# Patient Record
Sex: Female | Born: 1940
Health system: Southern US, Community
[De-identification: ages and names within clinical notes are randomized; demographics above are authoritative.]

## PROBLEM LIST (undated history)

## (undated) DIAGNOSIS — M1990 Unspecified osteoarthritis, unspecified site: Secondary | ICD-10-CM

## (undated) DIAGNOSIS — K219 Gastro-esophageal reflux disease without esophagitis: Secondary | ICD-10-CM

## (undated) DIAGNOSIS — M810 Age-related osteoporosis without current pathological fracture: Secondary | ICD-10-CM

## (undated) DIAGNOSIS — J45909 Unspecified asthma, uncomplicated: Secondary | ICD-10-CM

## (undated) DIAGNOSIS — I10 Essential (primary) hypertension: Secondary | ICD-10-CM

## (undated) DIAGNOSIS — F419 Anxiety disorder, unspecified: Secondary | ICD-10-CM

## (undated) DIAGNOSIS — C50919 Malignant neoplasm of unspecified site of unspecified female breast: Secondary | ICD-10-CM

## (undated) DIAGNOSIS — F32A Depression, unspecified: Secondary | ICD-10-CM

## (undated) DIAGNOSIS — J189 Pneumonia, unspecified organism: Secondary | ICD-10-CM

## (undated) DIAGNOSIS — H269 Unspecified cataract: Secondary | ICD-10-CM

## (undated) DIAGNOSIS — K589 Irritable bowel syndrome without diarrhea: Secondary | ICD-10-CM

## (undated) DIAGNOSIS — J449 Chronic obstructive pulmonary disease, unspecified: Secondary | ICD-10-CM

## (undated) DIAGNOSIS — Z87448 Personal history of other diseases of urinary system: Secondary | ICD-10-CM

## (undated) DIAGNOSIS — I679 Cerebrovascular disease, unspecified: Secondary | ICD-10-CM

## (undated) HISTORY — PX: TONSILLECTOMY AND ADENOIDECTOMY: SUR1326

## (undated) HISTORY — PX: CATARACT EXTRACTION: SUR2

## (undated) HISTORY — DX: Age-related osteoporosis without current pathological fracture: M81.0

## (undated) HISTORY — DX: Irritable bowel syndrome, unspecified: K58.9

## (undated) HISTORY — DX: Pneumonia, unspecified organism: J18.9

## (undated) HISTORY — DX: Gastro-esophageal reflux disease without esophagitis: K21.9

## (undated) HISTORY — DX: Anxiety disorder, unspecified: F41.9

## (undated) HISTORY — DX: Unspecified osteoarthritis, unspecified site: M19.90

## (undated) HISTORY — DX: Unspecified asthma, uncomplicated: J45.909

## (undated) HISTORY — DX: Unspecified cataract: H26.9

## (undated) HISTORY — DX: Malignant neoplasm of unspecified site of unspecified female breast: C50.919

## (undated) HISTORY — DX: Personal history of other diseases of urinary system: Z87.448

## (undated) HISTORY — PX: KNEE ARTHROSCOPY: SUR90

## (undated) HISTORY — DX: Essential (primary) hypertension: I10

## (undated) HISTORY — DX: Chronic obstructive pulmonary disease, unspecified: J44.9

## (undated) HISTORY — PX: POLYPECTOMY: SHX149

## (undated) HISTORY — PX: APPENDECTOMY: SHX54

## (undated) HISTORY — PX: BACK SURGERY: SHX140

## (undated) HISTORY — DX: Cerebrovascular disease, unspecified: I67.9

## (undated) HISTORY — PX: TUBAL LIGATION: SHX77

## (undated) HISTORY — DX: Depression, unspecified: F32.A

---

## 1975-01-19 HISTORY — PX: ABDOMINAL HYSTERECTOMY: SHX81

## 1995-11-19 DIAGNOSIS — K219 Gastro-esophageal reflux disease without esophagitis: Secondary | ICD-10-CM | POA: Insufficient documentation

## 1995-11-19 HISTORY — DX: Gastro-esophageal reflux disease without esophagitis: K21.9

## 2011-01-25 DIAGNOSIS — J069 Acute upper respiratory infection, unspecified: Secondary | ICD-10-CM | POA: Diagnosis not present

## 2011-01-25 DIAGNOSIS — Z6830 Body mass index (BMI) 30.0-30.9, adult: Secondary | ICD-10-CM | POA: Diagnosis not present

## 2011-03-23 DIAGNOSIS — Z6830 Body mass index (BMI) 30.0-30.9, adult: Secondary | ICD-10-CM | POA: Diagnosis not present

## 2011-03-23 DIAGNOSIS — J209 Acute bronchitis, unspecified: Secondary | ICD-10-CM | POA: Diagnosis not present

## 2011-04-22 DIAGNOSIS — E785 Hyperlipidemia, unspecified: Secondary | ICD-10-CM | POA: Diagnosis not present

## 2011-04-22 DIAGNOSIS — D539 Nutritional anemia, unspecified: Secondary | ICD-10-CM | POA: Diagnosis not present

## 2011-04-26 DIAGNOSIS — K219 Gastro-esophageal reflux disease without esophagitis: Secondary | ICD-10-CM | POA: Diagnosis not present

## 2011-04-26 DIAGNOSIS — R252 Cramp and spasm: Secondary | ICD-10-CM | POA: Diagnosis not present

## 2011-04-26 DIAGNOSIS — Z6829 Body mass index (BMI) 29.0-29.9, adult: Secondary | ICD-10-CM | POA: Diagnosis not present

## 2011-04-26 DIAGNOSIS — E785 Hyperlipidemia, unspecified: Secondary | ICD-10-CM | POA: Diagnosis not present

## 2011-05-10 DIAGNOSIS — M79609 Pain in unspecified limb: Secondary | ICD-10-CM | POA: Diagnosis not present

## 2011-05-10 DIAGNOSIS — Z981 Arthrodesis status: Secondary | ICD-10-CM | POA: Diagnosis not present

## 2011-05-10 DIAGNOSIS — M4000 Postural kyphosis, site unspecified: Secondary | ICD-10-CM | POA: Diagnosis not present

## 2011-05-10 DIAGNOSIS — T84019A Broken internal joint prosthesis, unspecified site, initial encounter: Secondary | ICD-10-CM | POA: Diagnosis not present

## 2011-06-18 DIAGNOSIS — Z1231 Encounter for screening mammogram for malignant neoplasm of breast: Secondary | ICD-10-CM | POA: Diagnosis not present

## 2011-07-13 DIAGNOSIS — Z6830 Body mass index (BMI) 30.0-30.9, adult: Secondary | ICD-10-CM | POA: Diagnosis not present

## 2011-07-13 DIAGNOSIS — M25559 Pain in unspecified hip: Secondary | ICD-10-CM | POA: Diagnosis not present

## 2011-07-13 DIAGNOSIS — R51 Headache: Secondary | ICD-10-CM | POA: Diagnosis not present

## 2011-07-13 DIAGNOSIS — M5382 Other specified dorsopathies, cervical region: Secondary | ICD-10-CM | POA: Diagnosis not present

## 2011-07-15 DIAGNOSIS — M25559 Pain in unspecified hip: Secondary | ICD-10-CM | POA: Diagnosis not present

## 2011-07-15 DIAGNOSIS — R51 Headache: Secondary | ICD-10-CM | POA: Diagnosis not present

## 2011-07-15 DIAGNOSIS — M5382 Other specified dorsopathies, cervical region: Secondary | ICD-10-CM | POA: Diagnosis not present

## 2011-07-15 DIAGNOSIS — G319 Degenerative disease of nervous system, unspecified: Secondary | ICD-10-CM | POA: Diagnosis not present

## 2011-07-16 DIAGNOSIS — Z981 Arthrodesis status: Secondary | ICD-10-CM | POA: Diagnosis not present

## 2011-07-16 DIAGNOSIS — W050XXA Fall from non-moving wheelchair, initial encounter: Secondary | ICD-10-CM | POA: Diagnosis not present

## 2011-07-16 DIAGNOSIS — Y92009 Unspecified place in unspecified non-institutional (private) residence as the place of occurrence of the external cause: Secondary | ICD-10-CM | POA: Diagnosis not present

## 2011-07-16 DIAGNOSIS — T84019A Broken internal joint prosthesis, unspecified site, initial encounter: Secondary | ICD-10-CM | POA: Diagnosis not present

## 2011-07-29 DIAGNOSIS — M1990 Unspecified osteoarthritis, unspecified site: Secondary | ICD-10-CM | POA: Diagnosis not present

## 2011-07-29 DIAGNOSIS — I10 Essential (primary) hypertension: Secondary | ICD-10-CM | POA: Diagnosis not present

## 2011-07-29 DIAGNOSIS — Z6830 Body mass index (BMI) 30.0-30.9, adult: Secondary | ICD-10-CM | POA: Diagnosis not present

## 2011-07-29 DIAGNOSIS — E785 Hyperlipidemia, unspecified: Secondary | ICD-10-CM | POA: Diagnosis not present

## 2011-08-11 DIAGNOSIS — M545 Low back pain: Secondary | ICD-10-CM | POA: Diagnosis not present

## 2011-08-13 DIAGNOSIS — M545 Low back pain: Secondary | ICD-10-CM | POA: Diagnosis not present

## 2011-08-17 DIAGNOSIS — M545 Low back pain: Secondary | ICD-10-CM | POA: Diagnosis not present

## 2011-08-19 DIAGNOSIS — M545 Low back pain: Secondary | ICD-10-CM | POA: Diagnosis not present

## 2011-08-24 DIAGNOSIS — M545 Low back pain: Secondary | ICD-10-CM | POA: Diagnosis not present

## 2011-08-26 DIAGNOSIS — M545 Low back pain: Secondary | ICD-10-CM | POA: Diagnosis not present

## 2011-08-31 DIAGNOSIS — M545 Low back pain: Secondary | ICD-10-CM | POA: Diagnosis not present

## 2011-09-02 DIAGNOSIS — M545 Low back pain: Secondary | ICD-10-CM | POA: Diagnosis not present

## 2011-09-07 DIAGNOSIS — M545 Low back pain: Secondary | ICD-10-CM | POA: Diagnosis not present

## 2011-09-09 DIAGNOSIS — M545 Low back pain: Secondary | ICD-10-CM | POA: Diagnosis not present

## 2011-09-10 DIAGNOSIS — Z1212 Encounter for screening for malignant neoplasm of rectum: Secondary | ICD-10-CM | POA: Diagnosis not present

## 2011-09-10 DIAGNOSIS — E785 Hyperlipidemia, unspecified: Secondary | ICD-10-CM | POA: Diagnosis not present

## 2011-09-10 DIAGNOSIS — Z6830 Body mass index (BMI) 30.0-30.9, adult: Secondary | ICD-10-CM | POA: Diagnosis not present

## 2011-09-10 DIAGNOSIS — Z0000 Encounter for general adult medical examination without abnormal findings: Secondary | ICD-10-CM | POA: Diagnosis not present

## 2011-09-14 DIAGNOSIS — M545 Low back pain: Secondary | ICD-10-CM | POA: Diagnosis not present

## 2011-09-16 DIAGNOSIS — M545 Low back pain: Secondary | ICD-10-CM | POA: Diagnosis not present

## 2011-09-21 DIAGNOSIS — M545 Low back pain: Secondary | ICD-10-CM | POA: Diagnosis not present

## 2011-09-27 DIAGNOSIS — M545 Low back pain: Secondary | ICD-10-CM | POA: Diagnosis not present

## 2011-09-28 DIAGNOSIS — J449 Chronic obstructive pulmonary disease, unspecified: Secondary | ICD-10-CM | POA: Diagnosis not present

## 2011-10-01 DIAGNOSIS — M899 Disorder of bone, unspecified: Secondary | ICD-10-CM | POA: Diagnosis not present

## 2011-10-01 DIAGNOSIS — Z13820 Encounter for screening for osteoporosis: Secondary | ICD-10-CM | POA: Diagnosis not present

## 2011-10-01 DIAGNOSIS — M545 Low back pain: Secondary | ICD-10-CM | POA: Diagnosis not present

## 2011-10-01 DIAGNOSIS — M949 Disorder of cartilage, unspecified: Secondary | ICD-10-CM | POA: Diagnosis not present

## 2011-10-11 DIAGNOSIS — R1013 Epigastric pain: Secondary | ICD-10-CM | POA: Diagnosis not present

## 2011-10-25 DIAGNOSIS — R1013 Epigastric pain: Secondary | ICD-10-CM | POA: Diagnosis not present

## 2011-11-22 DIAGNOSIS — Z6830 Body mass index (BMI) 30.0-30.9, adult: Secondary | ICD-10-CM | POA: Diagnosis not present

## 2011-11-22 DIAGNOSIS — J180 Bronchopneumonia, unspecified organism: Secondary | ICD-10-CM | POA: Diagnosis not present

## 2011-11-29 DIAGNOSIS — J180 Bronchopneumonia, unspecified organism: Secondary | ICD-10-CM | POA: Diagnosis not present

## 2012-01-10 DIAGNOSIS — Z6829 Body mass index (BMI) 29.0-29.9, adult: Secondary | ICD-10-CM | POA: Diagnosis not present

## 2012-01-10 DIAGNOSIS — I10 Essential (primary) hypertension: Secondary | ICD-10-CM | POA: Diagnosis not present

## 2012-01-10 DIAGNOSIS — K219 Gastro-esophageal reflux disease without esophagitis: Secondary | ICD-10-CM | POA: Diagnosis not present

## 2012-01-10 DIAGNOSIS — R252 Cramp and spasm: Secondary | ICD-10-CM | POA: Diagnosis not present

## 2012-03-24 DIAGNOSIS — M47817 Spondylosis without myelopathy or radiculopathy, lumbosacral region: Secondary | ICD-10-CM | POA: Diagnosis not present

## 2012-03-24 DIAGNOSIS — M4180 Other forms of scoliosis, site unspecified: Secondary | ICD-10-CM | POA: Diagnosis not present

## 2012-03-24 DIAGNOSIS — M5137 Other intervertebral disc degeneration, lumbosacral region: Secondary | ICD-10-CM | POA: Diagnosis not present

## 2012-03-24 DIAGNOSIS — M545 Low back pain: Secondary | ICD-10-CM | POA: Diagnosis not present

## 2012-03-24 DIAGNOSIS — M40299 Other kyphosis, site unspecified: Secondary | ICD-10-CM | POA: Diagnosis not present

## 2012-03-24 DIAGNOSIS — M48061 Spinal stenosis, lumbar region without neurogenic claudication: Secondary | ICD-10-CM | POA: Diagnosis not present

## 2012-03-24 DIAGNOSIS — M8448XA Pathological fracture, other site, initial encounter for fracture: Secondary | ICD-10-CM | POA: Diagnosis not present

## 2012-03-24 DIAGNOSIS — R937 Abnormal findings on diagnostic imaging of other parts of musculoskeletal system: Secondary | ICD-10-CM | POA: Diagnosis not present

## 2012-04-14 DIAGNOSIS — J019 Acute sinusitis, unspecified: Secondary | ICD-10-CM | POA: Diagnosis not present

## 2012-04-14 DIAGNOSIS — J309 Allergic rhinitis, unspecified: Secondary | ICD-10-CM | POA: Diagnosis not present

## 2012-04-14 DIAGNOSIS — Z6830 Body mass index (BMI) 30.0-30.9, adult: Secondary | ICD-10-CM | POA: Diagnosis not present

## 2012-04-14 DIAGNOSIS — R42 Dizziness and giddiness: Secondary | ICD-10-CM | POA: Diagnosis not present

## 2012-05-15 DIAGNOSIS — G4700 Insomnia, unspecified: Secondary | ICD-10-CM | POA: Diagnosis not present

## 2012-05-15 DIAGNOSIS — K219 Gastro-esophageal reflux disease without esophagitis: Secondary | ICD-10-CM | POA: Diagnosis not present

## 2012-05-15 DIAGNOSIS — E785 Hyperlipidemia, unspecified: Secondary | ICD-10-CM | POA: Diagnosis not present

## 2012-05-15 DIAGNOSIS — D539 Nutritional anemia, unspecified: Secondary | ICD-10-CM | POA: Diagnosis not present

## 2012-05-15 DIAGNOSIS — J45909 Unspecified asthma, uncomplicated: Secondary | ICD-10-CM | POA: Diagnosis not present

## 2012-05-15 DIAGNOSIS — Z9181 History of falling: Secondary | ICD-10-CM | POA: Diagnosis not present

## 2012-05-15 DIAGNOSIS — I10 Essential (primary) hypertension: Secondary | ICD-10-CM | POA: Diagnosis not present

## 2012-05-15 DIAGNOSIS — Z1331 Encounter for screening for depression: Secondary | ICD-10-CM | POA: Diagnosis not present

## 2012-06-05 DIAGNOSIS — H93299 Other abnormal auditory perceptions, unspecified ear: Secondary | ICD-10-CM | POA: Diagnosis not present

## 2012-06-05 DIAGNOSIS — R350 Frequency of micturition: Secondary | ICD-10-CM | POA: Diagnosis not present

## 2012-06-05 DIAGNOSIS — Z6829 Body mass index (BMI) 29.0-29.9, adult: Secondary | ICD-10-CM | POA: Diagnosis not present

## 2012-06-05 DIAGNOSIS — H9209 Otalgia, unspecified ear: Secondary | ICD-10-CM | POA: Diagnosis not present

## 2012-06-19 DIAGNOSIS — H903 Sensorineural hearing loss, bilateral: Secondary | ICD-10-CM | POA: Diagnosis not present

## 2012-06-20 DIAGNOSIS — Z1231 Encounter for screening mammogram for malignant neoplasm of breast: Secondary | ICD-10-CM | POA: Diagnosis not present

## 2012-07-04 DIAGNOSIS — J45909 Unspecified asthma, uncomplicated: Secondary | ICD-10-CM | POA: Diagnosis not present

## 2012-07-04 DIAGNOSIS — I739 Peripheral vascular disease, unspecified: Secondary | ICD-10-CM | POA: Diagnosis not present

## 2012-07-04 DIAGNOSIS — R071 Chest pain on breathing: Secondary | ICD-10-CM | POA: Diagnosis not present

## 2012-07-04 DIAGNOSIS — G2581 Restless legs syndrome: Secondary | ICD-10-CM | POA: Diagnosis not present

## 2012-07-04 DIAGNOSIS — R079 Chest pain, unspecified: Secondary | ICD-10-CM | POA: Diagnosis not present

## 2012-07-04 DIAGNOSIS — E785 Hyperlipidemia, unspecified: Secondary | ICD-10-CM | POA: Diagnosis not present

## 2012-07-04 DIAGNOSIS — M5030 Other cervical disc degeneration, unspecified cervical region: Secondary | ICD-10-CM | POA: Diagnosis not present

## 2012-07-04 DIAGNOSIS — R0789 Other chest pain: Secondary | ICD-10-CM | POA: Diagnosis not present

## 2012-07-04 DIAGNOSIS — R9431 Abnormal electrocardiogram [ECG] [EKG]: Secondary | ICD-10-CM | POA: Diagnosis not present

## 2012-07-04 DIAGNOSIS — I10 Essential (primary) hypertension: Secondary | ICD-10-CM | POA: Diagnosis not present

## 2012-07-04 DIAGNOSIS — Z79899 Other long term (current) drug therapy: Secondary | ICD-10-CM | POA: Diagnosis not present

## 2012-07-04 DIAGNOSIS — E876 Hypokalemia: Secondary | ICD-10-CM | POA: Diagnosis not present

## 2012-07-05 DIAGNOSIS — I10 Essential (primary) hypertension: Secondary | ICD-10-CM | POA: Diagnosis not present

## 2012-07-05 DIAGNOSIS — R0789 Other chest pain: Secondary | ICD-10-CM | POA: Diagnosis not present

## 2012-07-05 DIAGNOSIS — E785 Hyperlipidemia, unspecified: Secondary | ICD-10-CM | POA: Diagnosis not present

## 2012-07-05 DIAGNOSIS — E876 Hypokalemia: Secondary | ICD-10-CM | POA: Diagnosis not present

## 2012-07-05 DIAGNOSIS — R071 Chest pain on breathing: Secondary | ICD-10-CM | POA: Diagnosis not present

## 2012-07-12 DIAGNOSIS — R079 Chest pain, unspecified: Secondary | ICD-10-CM | POA: Diagnosis not present

## 2012-07-12 DIAGNOSIS — Z6830 Body mass index (BMI) 30.0-30.9, adult: Secondary | ICD-10-CM | POA: Diagnosis not present

## 2012-07-12 DIAGNOSIS — M5030 Other cervical disc degeneration, unspecified cervical region: Secondary | ICD-10-CM | POA: Diagnosis not present

## 2012-07-12 DIAGNOSIS — M5382 Other specified dorsopathies, cervical region: Secondary | ICD-10-CM | POA: Diagnosis not present

## 2012-07-12 DIAGNOSIS — I10 Essential (primary) hypertension: Secondary | ICD-10-CM | POA: Diagnosis not present

## 2012-07-14 DIAGNOSIS — M5382 Other specified dorsopathies, cervical region: Secondary | ICD-10-CM | POA: Diagnosis not present

## 2012-07-17 DIAGNOSIS — I10 Essential (primary) hypertension: Secondary | ICD-10-CM | POA: Diagnosis not present

## 2012-07-17 DIAGNOSIS — R42 Dizziness and giddiness: Secondary | ICD-10-CM | POA: Diagnosis not present

## 2012-07-17 DIAGNOSIS — R51 Headache: Secondary | ICD-10-CM | POA: Diagnosis not present

## 2012-07-17 DIAGNOSIS — S90569A Insect bite (nonvenomous), unspecified ankle, initial encounter: Secondary | ICD-10-CM | POA: Diagnosis not present

## 2012-07-17 DIAGNOSIS — W57XXXA Bitten or stung by nonvenomous insect and other nonvenomous arthropods, initial encounter: Secondary | ICD-10-CM | POA: Diagnosis not present

## 2012-07-17 DIAGNOSIS — T148 Other injury of unspecified body region: Secondary | ICD-10-CM | POA: Diagnosis not present

## 2012-07-31 DIAGNOSIS — M5382 Other specified dorsopathies, cervical region: Secondary | ICD-10-CM | POA: Diagnosis not present

## 2012-08-01 DIAGNOSIS — H25039 Anterior subcapsular polar age-related cataract, unspecified eye: Secondary | ICD-10-CM | POA: Diagnosis not present

## 2012-08-01 DIAGNOSIS — H43819 Vitreous degeneration, unspecified eye: Secondary | ICD-10-CM | POA: Diagnosis not present

## 2012-08-01 DIAGNOSIS — H5200 Hypermetropia, unspecified eye: Secondary | ICD-10-CM | POA: Diagnosis not present

## 2012-08-01 DIAGNOSIS — H52229 Regular astigmatism, unspecified eye: Secondary | ICD-10-CM | POA: Diagnosis not present

## 2012-08-02 DIAGNOSIS — M5382 Other specified dorsopathies, cervical region: Secondary | ICD-10-CM | POA: Diagnosis not present

## 2012-08-04 DIAGNOSIS — H2589 Other age-related cataract: Secondary | ICD-10-CM | POA: Diagnosis not present

## 2012-08-04 DIAGNOSIS — H353190 Nonexudative age-related macular degeneration, unspecified eye, stage unspecified: Secondary | ICD-10-CM | POA: Diagnosis not present

## 2012-08-04 DIAGNOSIS — M5382 Other specified dorsopathies, cervical region: Secondary | ICD-10-CM | POA: Diagnosis not present

## 2012-08-07 DIAGNOSIS — M5382 Other specified dorsopathies, cervical region: Secondary | ICD-10-CM | POA: Diagnosis not present

## 2012-08-09 DIAGNOSIS — M5382 Other specified dorsopathies, cervical region: Secondary | ICD-10-CM | POA: Diagnosis not present

## 2012-08-11 DIAGNOSIS — M5382 Other specified dorsopathies, cervical region: Secondary | ICD-10-CM | POA: Diagnosis not present

## 2012-08-14 DIAGNOSIS — M5382 Other specified dorsopathies, cervical region: Secondary | ICD-10-CM | POA: Diagnosis not present

## 2012-08-15 DIAGNOSIS — L255 Unspecified contact dermatitis due to plants, except food: Secondary | ICD-10-CM | POA: Diagnosis not present

## 2012-08-15 DIAGNOSIS — Z6829 Body mass index (BMI) 29.0-29.9, adult: Secondary | ICD-10-CM | POA: Diagnosis not present

## 2012-08-17 DIAGNOSIS — M5382 Other specified dorsopathies, cervical region: Secondary | ICD-10-CM | POA: Diagnosis not present

## 2012-08-21 DIAGNOSIS — M5382 Other specified dorsopathies, cervical region: Secondary | ICD-10-CM | POA: Diagnosis not present

## 2012-08-24 DIAGNOSIS — M5382 Other specified dorsopathies, cervical region: Secondary | ICD-10-CM | POA: Diagnosis not present

## 2012-08-25 DIAGNOSIS — E785 Hyperlipidemia, unspecified: Secondary | ICD-10-CM | POA: Diagnosis not present

## 2012-08-25 DIAGNOSIS — R252 Cramp and spasm: Secondary | ICD-10-CM | POA: Diagnosis not present

## 2012-08-25 DIAGNOSIS — D539 Nutritional anemia, unspecified: Secondary | ICD-10-CM | POA: Diagnosis not present

## 2012-08-25 DIAGNOSIS — I10 Essential (primary) hypertension: Secondary | ICD-10-CM | POA: Diagnosis not present

## 2012-08-25 DIAGNOSIS — K219 Gastro-esophageal reflux disease without esophagitis: Secondary | ICD-10-CM | POA: Diagnosis not present

## 2012-08-25 DIAGNOSIS — Z6828 Body mass index (BMI) 28.0-28.9, adult: Secondary | ICD-10-CM | POA: Diagnosis not present

## 2012-08-25 DIAGNOSIS — E538 Deficiency of other specified B group vitamins: Secondary | ICD-10-CM | POA: Diagnosis not present

## 2012-08-28 DIAGNOSIS — M5382 Other specified dorsopathies, cervical region: Secondary | ICD-10-CM | POA: Diagnosis not present

## 2012-08-30 DIAGNOSIS — M5382 Other specified dorsopathies, cervical region: Secondary | ICD-10-CM | POA: Diagnosis not present

## 2012-09-06 DIAGNOSIS — M4000 Postural kyphosis, site unspecified: Secondary | ICD-10-CM | POA: Diagnosis not present

## 2012-09-26 DIAGNOSIS — H269 Unspecified cataract: Secondary | ICD-10-CM | POA: Diagnosis not present

## 2012-09-26 DIAGNOSIS — H2589 Other age-related cataract: Secondary | ICD-10-CM | POA: Diagnosis not present

## 2012-09-26 DIAGNOSIS — H259 Unspecified age-related cataract: Secondary | ICD-10-CM | POA: Diagnosis not present

## 2012-10-03 DIAGNOSIS — K219 Gastro-esophageal reflux disease without esophagitis: Secondary | ICD-10-CM | POA: Diagnosis not present

## 2012-10-03 DIAGNOSIS — J449 Chronic obstructive pulmonary disease, unspecified: Secondary | ICD-10-CM | POA: Diagnosis not present

## 2012-10-16 DIAGNOSIS — K439 Ventral hernia without obstruction or gangrene: Secondary | ICD-10-CM | POA: Diagnosis not present

## 2012-10-16 HISTORY — DX: Ventral hernia without obstruction or gangrene: K43.9

## 2012-10-17 DIAGNOSIS — H04129 Dry eye syndrome of unspecified lacrimal gland: Secondary | ICD-10-CM | POA: Diagnosis not present

## 2012-10-17 DIAGNOSIS — H40019 Open angle with borderline findings, low risk, unspecified eye: Secondary | ICD-10-CM | POA: Diagnosis not present

## 2012-10-23 DIAGNOSIS — H2510 Age-related nuclear cataract, unspecified eye: Secondary | ICD-10-CM | POA: Diagnosis not present

## 2012-10-23 DIAGNOSIS — H2589 Other age-related cataract: Secondary | ICD-10-CM | POA: Diagnosis not present

## 2012-10-31 DIAGNOSIS — Z9889 Other specified postprocedural states: Secondary | ICD-10-CM | POA: Insufficient documentation

## 2012-10-31 DIAGNOSIS — Z01818 Encounter for other preprocedural examination: Secondary | ICD-10-CM | POA: Diagnosis not present

## 2012-10-31 HISTORY — DX: Other specified postprocedural states: Z98.890

## 2012-11-03 DIAGNOSIS — L98499 Non-pressure chronic ulcer of skin of other sites with unspecified severity: Secondary | ICD-10-CM | POA: Diagnosis not present

## 2012-11-03 DIAGNOSIS — K439 Ventral hernia without obstruction or gangrene: Secondary | ICD-10-CM | POA: Diagnosis not present

## 2012-11-21 DIAGNOSIS — N390 Urinary tract infection, site not specified: Secondary | ICD-10-CM | POA: Diagnosis not present

## 2012-11-21 DIAGNOSIS — Z6829 Body mass index (BMI) 29.0-29.9, adult: Secondary | ICD-10-CM | POA: Diagnosis not present

## 2012-11-27 DIAGNOSIS — K439 Ventral hernia without obstruction or gangrene: Secondary | ICD-10-CM | POA: Diagnosis not present

## 2012-12-12 DIAGNOSIS — R350 Frequency of micturition: Secondary | ICD-10-CM | POA: Diagnosis not present

## 2012-12-12 DIAGNOSIS — Z6829 Body mass index (BMI) 29.0-29.9, adult: Secondary | ICD-10-CM | POA: Diagnosis not present

## 2012-12-12 DIAGNOSIS — E785 Hyperlipidemia, unspecified: Secondary | ICD-10-CM | POA: Diagnosis not present

## 2012-12-12 DIAGNOSIS — D539 Nutritional anemia, unspecified: Secondary | ICD-10-CM | POA: Diagnosis not present

## 2012-12-12 DIAGNOSIS — I10 Essential (primary) hypertension: Secondary | ICD-10-CM | POA: Diagnosis not present

## 2012-12-13 DIAGNOSIS — N952 Postmenopausal atrophic vaginitis: Secondary | ICD-10-CM | POA: Diagnosis not present

## 2012-12-13 DIAGNOSIS — N3941 Urge incontinence: Secondary | ICD-10-CM | POA: Diagnosis not present

## 2012-12-22 DIAGNOSIS — Z6829 Body mass index (BMI) 29.0-29.9, adult: Secondary | ICD-10-CM | POA: Diagnosis not present

## 2012-12-22 DIAGNOSIS — J209 Acute bronchitis, unspecified: Secondary | ICD-10-CM | POA: Diagnosis not present

## 2013-02-19 DIAGNOSIS — R159 Full incontinence of feces: Secondary | ICD-10-CM | POA: Diagnosis not present

## 2013-02-19 DIAGNOSIS — M549 Dorsalgia, unspecified: Secondary | ICD-10-CM | POA: Diagnosis not present

## 2013-02-19 DIAGNOSIS — N318 Other neuromuscular dysfunction of bladder: Secondary | ICD-10-CM | POA: Diagnosis not present

## 2013-02-26 DIAGNOSIS — M545 Low back pain, unspecified: Secondary | ICD-10-CM | POA: Diagnosis not present

## 2013-02-26 DIAGNOSIS — Z6829 Body mass index (BMI) 29.0-29.9, adult: Secondary | ICD-10-CM | POA: Diagnosis not present

## 2013-02-26 DIAGNOSIS — J069 Acute upper respiratory infection, unspecified: Secondary | ICD-10-CM | POA: Diagnosis not present

## 2013-02-27 DIAGNOSIS — M545 Low back pain, unspecified: Secondary | ICD-10-CM | POA: Diagnosis not present

## 2013-02-28 DIAGNOSIS — M545 Low back pain, unspecified: Secondary | ICD-10-CM | POA: Diagnosis not present

## 2013-03-01 DIAGNOSIS — M545 Low back pain, unspecified: Secondary | ICD-10-CM | POA: Diagnosis not present

## 2013-03-02 DIAGNOSIS — M545 Low back pain, unspecified: Secondary | ICD-10-CM | POA: Diagnosis not present

## 2013-03-03 DIAGNOSIS — M545 Low back pain, unspecified: Secondary | ICD-10-CM | POA: Diagnosis not present

## 2013-03-03 DIAGNOSIS — M6281 Muscle weakness (generalized): Secondary | ICD-10-CM | POA: Diagnosis not present

## 2013-03-07 DIAGNOSIS — J45901 Unspecified asthma with (acute) exacerbation: Secondary | ICD-10-CM | POA: Diagnosis not present

## 2013-03-07 DIAGNOSIS — J019 Acute sinusitis, unspecified: Secondary | ICD-10-CM | POA: Diagnosis not present

## 2013-03-20 DIAGNOSIS — Z6830 Body mass index (BMI) 30.0-30.9, adult: Secondary | ICD-10-CM | POA: Diagnosis not present

## 2013-03-20 DIAGNOSIS — K6289 Other specified diseases of anus and rectum: Secondary | ICD-10-CM | POA: Diagnosis not present

## 2013-03-28 DIAGNOSIS — R3915 Urgency of urination: Secondary | ICD-10-CM | POA: Diagnosis not present

## 2013-03-30 DIAGNOSIS — I10 Essential (primary) hypertension: Secondary | ICD-10-CM | POA: Diagnosis not present

## 2013-03-30 DIAGNOSIS — D539 Nutritional anemia, unspecified: Secondary | ICD-10-CM | POA: Diagnosis not present

## 2013-03-30 DIAGNOSIS — E785 Hyperlipidemia, unspecified: Secondary | ICD-10-CM | POA: Diagnosis not present

## 2013-03-30 DIAGNOSIS — Z6830 Body mass index (BMI) 30.0-30.9, adult: Secondary | ICD-10-CM | POA: Diagnosis not present

## 2013-03-30 DIAGNOSIS — J45909 Unspecified asthma, uncomplicated: Secondary | ICD-10-CM | POA: Diagnosis not present

## 2013-04-03 DIAGNOSIS — J40 Bronchitis, not specified as acute or chronic: Secondary | ICD-10-CM | POA: Diagnosis not present

## 2013-04-03 DIAGNOSIS — J9819 Other pulmonary collapse: Secondary | ICD-10-CM | POA: Diagnosis not present

## 2013-04-04 DIAGNOSIS — N3941 Urge incontinence: Secondary | ICD-10-CM | POA: Diagnosis not present

## 2013-04-04 DIAGNOSIS — N318 Other neuromuscular dysfunction of bladder: Secondary | ICD-10-CM | POA: Diagnosis not present

## 2013-04-11 DIAGNOSIS — N318 Other neuromuscular dysfunction of bladder: Secondary | ICD-10-CM | POA: Diagnosis not present

## 2013-04-11 DIAGNOSIS — R3915 Urgency of urination: Secondary | ICD-10-CM | POA: Diagnosis not present

## 2013-04-12 DIAGNOSIS — E7800 Pure hypercholesterolemia, unspecified: Secondary | ICD-10-CM | POA: Diagnosis not present

## 2013-04-12 DIAGNOSIS — Z79899 Other long term (current) drug therapy: Secondary | ICD-10-CM | POA: Diagnosis not present

## 2013-04-12 DIAGNOSIS — S81009A Unspecified open wound, unspecified knee, initial encounter: Secondary | ICD-10-CM | POA: Diagnosis not present

## 2013-04-12 DIAGNOSIS — IMO0002 Reserved for concepts with insufficient information to code with codable children: Secondary | ICD-10-CM | POA: Diagnosis not present

## 2013-04-12 DIAGNOSIS — S81809A Unspecified open wound, unspecified lower leg, initial encounter: Secondary | ICD-10-CM | POA: Diagnosis not present

## 2013-04-12 DIAGNOSIS — I10 Essential (primary) hypertension: Secondary | ICD-10-CM | POA: Diagnosis not present

## 2013-04-12 DIAGNOSIS — J45909 Unspecified asthma, uncomplicated: Secondary | ICD-10-CM | POA: Diagnosis not present

## 2013-04-17 DIAGNOSIS — K219 Gastro-esophageal reflux disease without esophagitis: Secondary | ICD-10-CM | POA: Diagnosis not present

## 2013-04-17 DIAGNOSIS — Z6829 Body mass index (BMI) 29.0-29.9, adult: Secondary | ICD-10-CM | POA: Diagnosis not present

## 2013-04-17 DIAGNOSIS — J45909 Unspecified asthma, uncomplicated: Secondary | ICD-10-CM | POA: Diagnosis not present

## 2013-04-18 DIAGNOSIS — N318 Other neuromuscular dysfunction of bladder: Secondary | ICD-10-CM | POA: Diagnosis not present

## 2013-04-18 DIAGNOSIS — R3915 Urgency of urination: Secondary | ICD-10-CM | POA: Diagnosis not present

## 2013-04-23 DIAGNOSIS — S81809A Unspecified open wound, unspecified lower leg, initial encounter: Secondary | ICD-10-CM | POA: Diagnosis not present

## 2013-04-23 DIAGNOSIS — Z6829 Body mass index (BMI) 29.0-29.9, adult: Secondary | ICD-10-CM | POA: Diagnosis not present

## 2013-04-23 DIAGNOSIS — S81009A Unspecified open wound, unspecified knee, initial encounter: Secondary | ICD-10-CM | POA: Diagnosis not present

## 2013-04-24 DIAGNOSIS — N3281 Overactive bladder: Secondary | ICD-10-CM | POA: Insufficient documentation

## 2013-04-24 HISTORY — DX: Overactive bladder: N32.81

## 2013-04-25 DIAGNOSIS — N318 Other neuromuscular dysfunction of bladder: Secondary | ICD-10-CM | POA: Diagnosis not present

## 2013-04-25 DIAGNOSIS — R3915 Urgency of urination: Secondary | ICD-10-CM | POA: Diagnosis not present

## 2013-04-26 DIAGNOSIS — I10 Essential (primary) hypertension: Secondary | ICD-10-CM | POA: Diagnosis not present

## 2013-04-26 DIAGNOSIS — R1012 Left upper quadrant pain: Secondary | ICD-10-CM | POA: Diagnosis not present

## 2013-04-26 DIAGNOSIS — N390 Urinary tract infection, site not specified: Secondary | ICD-10-CM | POA: Diagnosis not present

## 2013-04-26 DIAGNOSIS — K439 Ventral hernia without obstruction or gangrene: Secondary | ICD-10-CM | POA: Diagnosis not present

## 2013-04-26 DIAGNOSIS — Z79899 Other long term (current) drug therapy: Secondary | ICD-10-CM | POA: Diagnosis not present

## 2013-04-26 DIAGNOSIS — R109 Unspecified abdominal pain: Secondary | ICD-10-CM | POA: Diagnosis not present

## 2013-04-26 DIAGNOSIS — K7689 Other specified diseases of liver: Secondary | ICD-10-CM | POA: Diagnosis not present

## 2013-04-26 DIAGNOSIS — E7800 Pure hypercholesterolemia, unspecified: Secondary | ICD-10-CM | POA: Diagnosis not present

## 2013-04-26 DIAGNOSIS — J45909 Unspecified asthma, uncomplicated: Secondary | ICD-10-CM | POA: Diagnosis not present

## 2013-05-02 DIAGNOSIS — N3941 Urge incontinence: Secondary | ICD-10-CM | POA: Diagnosis not present

## 2013-05-02 DIAGNOSIS — N318 Other neuromuscular dysfunction of bladder: Secondary | ICD-10-CM | POA: Diagnosis not present

## 2013-05-03 DIAGNOSIS — L981 Factitial dermatitis: Secondary | ICD-10-CM | POA: Diagnosis not present

## 2013-05-09 DIAGNOSIS — N318 Other neuromuscular dysfunction of bladder: Secondary | ICD-10-CM | POA: Diagnosis not present

## 2013-05-09 DIAGNOSIS — N3941 Urge incontinence: Secondary | ICD-10-CM | POA: Diagnosis not present

## 2013-05-16 DIAGNOSIS — R3915 Urgency of urination: Secondary | ICD-10-CM | POA: Diagnosis not present

## 2013-05-16 DIAGNOSIS — N318 Other neuromuscular dysfunction of bladder: Secondary | ICD-10-CM | POA: Diagnosis not present

## 2013-05-25 DIAGNOSIS — N3941 Urge incontinence: Secondary | ICD-10-CM | POA: Diagnosis not present

## 2013-05-25 DIAGNOSIS — N318 Other neuromuscular dysfunction of bladder: Secondary | ICD-10-CM | POA: Diagnosis not present

## 2013-06-06 DIAGNOSIS — N390 Urinary tract infection, site not specified: Secondary | ICD-10-CM | POA: Diagnosis not present

## 2013-06-06 DIAGNOSIS — R3915 Urgency of urination: Secondary | ICD-10-CM | POA: Diagnosis not present

## 2013-06-13 DIAGNOSIS — N318 Other neuromuscular dysfunction of bladder: Secondary | ICD-10-CM | POA: Diagnosis not present

## 2013-06-13 DIAGNOSIS — R3915 Urgency of urination: Secondary | ICD-10-CM | POA: Diagnosis not present

## 2013-06-18 DIAGNOSIS — Z6830 Body mass index (BMI) 30.0-30.9, adult: Secondary | ICD-10-CM | POA: Diagnosis not present

## 2013-06-18 DIAGNOSIS — M545 Low back pain, unspecified: Secondary | ICD-10-CM | POA: Diagnosis not present

## 2013-06-18 DIAGNOSIS — M79609 Pain in unspecified limb: Secondary | ICD-10-CM | POA: Diagnosis not present

## 2013-06-18 DIAGNOSIS — S4980XA Other specified injuries of shoulder and upper arm, unspecified arm, initial encounter: Secondary | ICD-10-CM | POA: Diagnosis not present

## 2013-06-18 DIAGNOSIS — S59909A Unspecified injury of unspecified elbow, initial encounter: Secondary | ICD-10-CM | POA: Diagnosis not present

## 2013-06-18 DIAGNOSIS — IMO0002 Reserved for concepts with insufficient information to code with codable children: Secondary | ICD-10-CM | POA: Diagnosis not present

## 2013-06-18 DIAGNOSIS — S46909A Unspecified injury of unspecified muscle, fascia and tendon at shoulder and upper arm level, unspecified arm, initial encounter: Secondary | ICD-10-CM | POA: Diagnosis not present

## 2013-06-18 DIAGNOSIS — M25529 Pain in unspecified elbow: Secondary | ICD-10-CM | POA: Diagnosis not present

## 2013-06-27 DIAGNOSIS — N3941 Urge incontinence: Secondary | ICD-10-CM | POA: Diagnosis not present

## 2013-06-27 DIAGNOSIS — N390 Urinary tract infection, site not specified: Secondary | ICD-10-CM | POA: Diagnosis not present

## 2013-06-27 DIAGNOSIS — N318 Other neuromuscular dysfunction of bladder: Secondary | ICD-10-CM | POA: Diagnosis not present

## 2013-07-02 DIAGNOSIS — M545 Low back pain, unspecified: Secondary | ICD-10-CM | POA: Diagnosis not present

## 2013-07-03 DIAGNOSIS — N390 Urinary tract infection, site not specified: Secondary | ICD-10-CM | POA: Diagnosis not present

## 2013-07-03 DIAGNOSIS — N3941 Urge incontinence: Secondary | ICD-10-CM | POA: Diagnosis not present

## 2013-07-05 DIAGNOSIS — M545 Low back pain, unspecified: Secondary | ICD-10-CM | POA: Diagnosis not present

## 2013-07-05 DIAGNOSIS — D539 Nutritional anemia, unspecified: Secondary | ICD-10-CM | POA: Diagnosis not present

## 2013-07-05 DIAGNOSIS — Z6829 Body mass index (BMI) 29.0-29.9, adult: Secondary | ICD-10-CM | POA: Diagnosis not present

## 2013-07-05 DIAGNOSIS — I10 Essential (primary) hypertension: Secondary | ICD-10-CM | POA: Diagnosis not present

## 2013-07-05 DIAGNOSIS — E785 Hyperlipidemia, unspecified: Secondary | ICD-10-CM | POA: Diagnosis not present

## 2013-07-09 DIAGNOSIS — Z1231 Encounter for screening mammogram for malignant neoplasm of breast: Secondary | ICD-10-CM | POA: Diagnosis not present

## 2013-07-09 DIAGNOSIS — M545 Low back pain, unspecified: Secondary | ICD-10-CM | POA: Diagnosis not present

## 2013-07-09 DIAGNOSIS — Z981 Arthrodesis status: Secondary | ICD-10-CM | POA: Diagnosis not present

## 2013-07-11 DIAGNOSIS — M545 Low back pain, unspecified: Secondary | ICD-10-CM | POA: Diagnosis not present

## 2013-07-13 DIAGNOSIS — M545 Low back pain, unspecified: Secondary | ICD-10-CM | POA: Diagnosis not present

## 2013-07-17 DIAGNOSIS — M542 Cervicalgia: Secondary | ICD-10-CM | POA: Diagnosis not present

## 2013-07-17 DIAGNOSIS — M5030 Other cervical disc degeneration, unspecified cervical region: Secondary | ICD-10-CM | POA: Diagnosis not present

## 2013-07-17 DIAGNOSIS — M4804 Spinal stenosis, thoracic region: Secondary | ICD-10-CM | POA: Diagnosis not present

## 2013-07-17 DIAGNOSIS — M549 Dorsalgia, unspecified: Secondary | ICD-10-CM | POA: Diagnosis not present

## 2013-07-17 DIAGNOSIS — M47812 Spondylosis without myelopathy or radiculopathy, cervical region: Secondary | ICD-10-CM | POA: Diagnosis not present

## 2013-07-17 DIAGNOSIS — R52 Pain, unspecified: Secondary | ICD-10-CM | POA: Diagnosis not present

## 2013-07-17 DIAGNOSIS — M4800 Spinal stenosis, site unspecified: Secondary | ICD-10-CM | POA: Insufficient documentation

## 2013-07-17 DIAGNOSIS — S22009A Unspecified fracture of unspecified thoracic vertebra, initial encounter for closed fracture: Secondary | ICD-10-CM | POA: Diagnosis not present

## 2013-07-17 DIAGNOSIS — Z981 Arthrodesis status: Secondary | ICD-10-CM | POA: Diagnosis not present

## 2013-07-17 HISTORY — DX: Spinal stenosis, site unspecified: M48.00

## 2013-07-18 DIAGNOSIS — M5124 Other intervertebral disc displacement, thoracic region: Secondary | ICD-10-CM | POA: Diagnosis not present

## 2013-07-18 DIAGNOSIS — R52 Pain, unspecified: Secondary | ICD-10-CM | POA: Diagnosis not present

## 2013-07-23 DIAGNOSIS — M545 Low back pain, unspecified: Secondary | ICD-10-CM | POA: Diagnosis not present

## 2013-07-24 DIAGNOSIS — T84498A Other mechanical complication of other internal orthopedic devices, implants and grafts, initial encounter: Secondary | ICD-10-CM | POA: Insufficient documentation

## 2013-07-24 DIAGNOSIS — M4804 Spinal stenosis, thoracic region: Secondary | ICD-10-CM | POA: Diagnosis not present

## 2013-07-24 HISTORY — DX: Other mechanical complication of other internal orthopedic devices, implants and grafts, initial encounter: T84.498A

## 2013-07-25 DIAGNOSIS — M545 Low back pain, unspecified: Secondary | ICD-10-CM | POA: Diagnosis not present

## 2013-07-26 DIAGNOSIS — M545 Low back pain, unspecified: Secondary | ICD-10-CM | POA: Diagnosis not present

## 2013-07-30 DIAGNOSIS — M545 Low back pain, unspecified: Secondary | ICD-10-CM | POA: Diagnosis not present

## 2013-08-10 DIAGNOSIS — T6391XA Toxic effect of contact with unspecified venomous animal, accidental (unintentional), initial encounter: Secondary | ICD-10-CM | POA: Diagnosis not present

## 2013-08-10 DIAGNOSIS — IMO0002 Reserved for concepts with insufficient information to code with codable children: Secondary | ICD-10-CM | POA: Diagnosis not present

## 2013-08-10 DIAGNOSIS — Z6829 Body mass index (BMI) 29.0-29.9, adult: Secondary | ICD-10-CM | POA: Diagnosis not present

## 2013-08-17 DIAGNOSIS — I10 Essential (primary) hypertension: Secondary | ICD-10-CM | POA: Diagnosis not present

## 2013-08-20 DIAGNOSIS — T84498A Other mechanical complication of other internal orthopedic devices, implants and grafts, initial encounter: Secondary | ICD-10-CM | POA: Diagnosis not present

## 2013-08-20 DIAGNOSIS — H9190 Unspecified hearing loss, unspecified ear: Secondary | ICD-10-CM | POA: Diagnosis present

## 2013-08-20 DIAGNOSIS — Z981 Arthrodesis status: Secondary | ICD-10-CM | POA: Diagnosis not present

## 2013-08-20 DIAGNOSIS — M4804 Spinal stenosis, thoracic region: Secondary | ICD-10-CM | POA: Diagnosis not present

## 2013-08-20 DIAGNOSIS — Z8673 Personal history of transient ischemic attack (TIA), and cerebral infarction without residual deficits: Secondary | ICD-10-CM | POA: Diagnosis not present

## 2013-08-20 DIAGNOSIS — F411 Generalized anxiety disorder: Secondary | ICD-10-CM | POA: Diagnosis present

## 2013-08-20 DIAGNOSIS — R32 Unspecified urinary incontinence: Secondary | ICD-10-CM | POA: Diagnosis present

## 2013-08-20 DIAGNOSIS — Y831 Surgical operation with implant of artificial internal device as the cause of abnormal reaction of the patient, or of later complication, without mention of misadventure at the time of the procedure: Secondary | ICD-10-CM | POA: Diagnosis not present

## 2013-08-20 DIAGNOSIS — M129 Arthropathy, unspecified: Secondary | ICD-10-CM | POA: Diagnosis present

## 2013-08-20 DIAGNOSIS — R918 Other nonspecific abnormal finding of lung field: Secondary | ICD-10-CM | POA: Diagnosis not present

## 2013-08-20 DIAGNOSIS — IMO0002 Reserved for concepts with insufficient information to code with codable children: Secondary | ICD-10-CM | POA: Diagnosis not present

## 2013-08-20 DIAGNOSIS — E785 Hyperlipidemia, unspecified: Secondary | ICD-10-CM | POA: Diagnosis present

## 2013-08-20 DIAGNOSIS — J4489 Other specified chronic obstructive pulmonary disease: Secondary | ICD-10-CM | POA: Diagnosis not present

## 2013-08-20 DIAGNOSIS — M47812 Spondylosis without myelopathy or radiculopathy, cervical region: Secondary | ICD-10-CM | POA: Diagnosis not present

## 2013-08-20 DIAGNOSIS — K219 Gastro-esophageal reflux disease without esophagitis: Secondary | ICD-10-CM | POA: Diagnosis present

## 2013-08-20 DIAGNOSIS — R42 Dizziness and giddiness: Secondary | ICD-10-CM | POA: Diagnosis present

## 2013-08-20 DIAGNOSIS — Q762 Congenital spondylolisthesis: Secondary | ICD-10-CM | POA: Diagnosis not present

## 2013-08-20 DIAGNOSIS — M549 Dorsalgia, unspecified: Secondary | ICD-10-CM | POA: Diagnosis not present

## 2013-08-20 DIAGNOSIS — J449 Chronic obstructive pulmonary disease, unspecified: Secondary | ICD-10-CM | POA: Diagnosis not present

## 2013-08-20 DIAGNOSIS — I2510 Atherosclerotic heart disease of native coronary artery without angina pectoris: Secondary | ICD-10-CM | POA: Diagnosis present

## 2013-08-29 DIAGNOSIS — M8448XD Pathological fracture, other site, subsequent encounter for fracture with routine healing: Secondary | ICD-10-CM | POA: Diagnosis not present

## 2013-08-29 DIAGNOSIS — M129 Arthropathy, unspecified: Secondary | ICD-10-CM | POA: Diagnosis not present

## 2013-08-29 DIAGNOSIS — F411 Generalized anxiety disorder: Secondary | ICD-10-CM | POA: Diagnosis not present

## 2013-08-29 DIAGNOSIS — R262 Difficulty in walking, not elsewhere classified: Secondary | ICD-10-CM | POA: Diagnosis not present

## 2013-08-29 DIAGNOSIS — I10 Essential (primary) hypertension: Secondary | ICD-10-CM | POA: Diagnosis not present

## 2013-08-29 DIAGNOSIS — M47812 Spondylosis without myelopathy or radiculopathy, cervical region: Secondary | ICD-10-CM | POA: Diagnosis not present

## 2013-09-10 DIAGNOSIS — R109 Unspecified abdominal pain: Secondary | ICD-10-CM | POA: Diagnosis not present

## 2013-09-10 DIAGNOSIS — Z6830 Body mass index (BMI) 30.0-30.9, adult: Secondary | ICD-10-CM | POA: Diagnosis not present

## 2013-09-14 DIAGNOSIS — R109 Unspecified abdominal pain: Secondary | ICD-10-CM | POA: Diagnosis not present

## 2013-09-14 DIAGNOSIS — R1011 Right upper quadrant pain: Secondary | ICD-10-CM | POA: Diagnosis not present

## 2013-09-18 DIAGNOSIS — R1084 Generalized abdominal pain: Secondary | ICD-10-CM | POA: Diagnosis not present

## 2013-09-18 DIAGNOSIS — D649 Anemia, unspecified: Secondary | ICD-10-CM | POA: Diagnosis not present

## 2013-09-20 DIAGNOSIS — R109 Unspecified abdominal pain: Secondary | ICD-10-CM | POA: Diagnosis not present

## 2013-09-20 DIAGNOSIS — R1011 Right upper quadrant pain: Secondary | ICD-10-CM | POA: Diagnosis not present

## 2013-09-26 DIAGNOSIS — D131 Benign neoplasm of stomach: Secondary | ICD-10-CM | POA: Diagnosis not present

## 2013-09-26 DIAGNOSIS — D500 Iron deficiency anemia secondary to blood loss (chronic): Secondary | ICD-10-CM | POA: Diagnosis not present

## 2013-09-26 DIAGNOSIS — D509 Iron deficiency anemia, unspecified: Secondary | ICD-10-CM | POA: Diagnosis not present

## 2013-09-26 DIAGNOSIS — R1013 Epigastric pain: Secondary | ICD-10-CM | POA: Diagnosis not present

## 2013-10-01 DIAGNOSIS — R0789 Other chest pain: Secondary | ICD-10-CM | POA: Diagnosis not present

## 2013-10-01 DIAGNOSIS — K5900 Constipation, unspecified: Secondary | ICD-10-CM | POA: Diagnosis not present

## 2013-10-01 DIAGNOSIS — R141 Gas pain: Secondary | ICD-10-CM | POA: Diagnosis not present

## 2013-10-01 DIAGNOSIS — J811 Chronic pulmonary edema: Secondary | ICD-10-CM | POA: Diagnosis not present

## 2013-10-01 DIAGNOSIS — Z7982 Long term (current) use of aspirin: Secondary | ICD-10-CM | POA: Diagnosis not present

## 2013-10-01 DIAGNOSIS — J8409 Other alveolar and parieto-alveolar conditions: Secondary | ICD-10-CM | POA: Diagnosis not present

## 2013-10-01 DIAGNOSIS — R0902 Hypoxemia: Secondary | ICD-10-CM | POA: Diagnosis not present

## 2013-10-01 DIAGNOSIS — R079 Chest pain, unspecified: Secondary | ICD-10-CM | POA: Diagnosis not present

## 2013-10-01 DIAGNOSIS — K219 Gastro-esophageal reflux disease without esophagitis: Secondary | ICD-10-CM | POA: Diagnosis present

## 2013-10-01 DIAGNOSIS — K589 Irritable bowel syndrome without diarrhea: Secondary | ICD-10-CM | POA: Diagnosis present

## 2013-10-01 DIAGNOSIS — J9600 Acute respiratory failure, unspecified whether with hypoxia or hypercapnia: Secondary | ICD-10-CM | POA: Diagnosis not present

## 2013-10-01 DIAGNOSIS — K8020 Calculus of gallbladder without cholecystitis without obstruction: Secondary | ICD-10-CM | POA: Diagnosis not present

## 2013-10-01 DIAGNOSIS — J45909 Unspecified asthma, uncomplicated: Secondary | ICD-10-CM | POA: Diagnosis present

## 2013-10-01 DIAGNOSIS — K439 Ventral hernia without obstruction or gangrene: Secondary | ICD-10-CM | POA: Diagnosis not present

## 2013-10-01 DIAGNOSIS — Z79899 Other long term (current) drug therapy: Secondary | ICD-10-CM | POA: Diagnosis not present

## 2013-10-01 DIAGNOSIS — N281 Cyst of kidney, acquired: Secondary | ICD-10-CM | POA: Diagnosis not present

## 2013-10-01 DIAGNOSIS — M129 Arthropathy, unspecified: Secondary | ICD-10-CM | POA: Diagnosis present

## 2013-10-01 DIAGNOSIS — I10 Essential (primary) hypertension: Secondary | ICD-10-CM | POA: Diagnosis not present

## 2013-10-01 DIAGNOSIS — Z8673 Personal history of transient ischemic attack (TIA), and cerebral infarction without residual deficits: Secondary | ICD-10-CM | POA: Diagnosis not present

## 2013-10-01 DIAGNOSIS — J9819 Other pulmonary collapse: Secondary | ICD-10-CM | POA: Diagnosis not present

## 2013-10-01 DIAGNOSIS — J984 Other disorders of lung: Secondary | ICD-10-CM | POA: Diagnosis not present

## 2013-10-01 DIAGNOSIS — M5030 Other cervical disc degeneration, unspecified cervical region: Secondary | ICD-10-CM | POA: Diagnosis not present

## 2013-10-01 DIAGNOSIS — J8410 Pulmonary fibrosis, unspecified: Secondary | ICD-10-CM | POA: Diagnosis present

## 2013-10-01 DIAGNOSIS — R109 Unspecified abdominal pain: Secondary | ICD-10-CM | POA: Diagnosis not present

## 2013-10-01 DIAGNOSIS — E785 Hyperlipidemia, unspecified: Secondary | ICD-10-CM | POA: Diagnosis not present

## 2013-10-01 DIAGNOSIS — E7800 Pure hypercholesterolemia, unspecified: Secondary | ICD-10-CM | POA: Diagnosis present

## 2013-10-01 DIAGNOSIS — R0602 Shortness of breath: Secondary | ICD-10-CM | POA: Diagnosis not present

## 2013-10-09 DIAGNOSIS — D539 Nutritional anemia, unspecified: Secondary | ICD-10-CM | POA: Diagnosis not present

## 2013-10-09 DIAGNOSIS — K5900 Constipation, unspecified: Secondary | ICD-10-CM | POA: Diagnosis not present

## 2013-10-09 DIAGNOSIS — Z6829 Body mass index (BMI) 29.0-29.9, adult: Secondary | ICD-10-CM | POA: Diagnosis not present

## 2013-10-09 DIAGNOSIS — E538 Deficiency of other specified B group vitamins: Secondary | ICD-10-CM | POA: Diagnosis not present

## 2013-10-16 DIAGNOSIS — T84498A Other mechanical complication of other internal orthopedic devices, implants and grafts, initial encounter: Secondary | ICD-10-CM | POA: Diagnosis not present

## 2013-10-16 DIAGNOSIS — M5030 Other cervical disc degeneration, unspecified cervical region: Secondary | ICD-10-CM | POA: Diagnosis not present

## 2013-10-16 DIAGNOSIS — Z981 Arthrodesis status: Secondary | ICD-10-CM | POA: Diagnosis not present

## 2013-10-16 DIAGNOSIS — M5137 Other intervertebral disc degeneration, lumbosacral region: Secondary | ICD-10-CM | POA: Diagnosis not present

## 2013-10-16 DIAGNOSIS — M960 Pseudarthrosis after fusion or arthrodesis: Secondary | ICD-10-CM | POA: Insufficient documentation

## 2013-10-16 HISTORY — DX: Pseudarthrosis after fusion or arthrodesis: M96.0

## 2013-10-19 DIAGNOSIS — I10 Essential (primary) hypertension: Secondary | ICD-10-CM | POA: Diagnosis not present

## 2013-10-19 DIAGNOSIS — Z23 Encounter for immunization: Secondary | ICD-10-CM | POA: Diagnosis not present

## 2013-10-19 DIAGNOSIS — E785 Hyperlipidemia, unspecified: Secondary | ICD-10-CM | POA: Diagnosis not present

## 2013-10-19 DIAGNOSIS — E538 Deficiency of other specified B group vitamins: Secondary | ICD-10-CM | POA: Diagnosis not present

## 2013-10-19 DIAGNOSIS — D539 Nutritional anemia, unspecified: Secondary | ICD-10-CM | POA: Diagnosis not present

## 2013-10-19 DIAGNOSIS — Z6829 Body mass index (BMI) 29.0-29.9, adult: Secondary | ICD-10-CM | POA: Diagnosis not present

## 2013-11-06 DIAGNOSIS — J449 Chronic obstructive pulmonary disease, unspecified: Secondary | ICD-10-CM | POA: Diagnosis not present

## 2013-11-13 DIAGNOSIS — K5900 Constipation, unspecified: Secondary | ICD-10-CM | POA: Diagnosis not present

## 2013-11-13 DIAGNOSIS — K589 Irritable bowel syndrome without diarrhea: Secondary | ICD-10-CM | POA: Diagnosis not present

## 2013-11-28 DIAGNOSIS — M4804 Spinal stenosis, thoracic region: Secondary | ICD-10-CM | POA: Diagnosis not present

## 2013-11-30 DIAGNOSIS — M4325 Fusion of spine, thoracolumbar region: Secondary | ICD-10-CM | POA: Diagnosis not present

## 2013-11-30 DIAGNOSIS — M4804 Spinal stenosis, thoracic region: Secondary | ICD-10-CM | POA: Diagnosis not present

## 2013-11-30 DIAGNOSIS — R262 Difficulty in walking, not elsewhere classified: Secondary | ICD-10-CM | POA: Diagnosis not present

## 2013-11-30 DIAGNOSIS — M6281 Muscle weakness (generalized): Secondary | ICD-10-CM | POA: Diagnosis not present

## 2013-12-03 DIAGNOSIS — J209 Acute bronchitis, unspecified: Secondary | ICD-10-CM | POA: Diagnosis not present

## 2013-12-03 DIAGNOSIS — Z6830 Body mass index (BMI) 30.0-30.9, adult: Secondary | ICD-10-CM | POA: Diagnosis not present

## 2013-12-07 DIAGNOSIS — J209 Acute bronchitis, unspecified: Secondary | ICD-10-CM | POA: Diagnosis not present

## 2013-12-12 DIAGNOSIS — Z6830 Body mass index (BMI) 30.0-30.9, adult: Secondary | ICD-10-CM | POA: Diagnosis not present

## 2013-12-12 DIAGNOSIS — J209 Acute bronchitis, unspecified: Secondary | ICD-10-CM | POA: Diagnosis not present

## 2013-12-17 DIAGNOSIS — R079 Chest pain, unspecified: Secondary | ICD-10-CM | POA: Diagnosis not present

## 2013-12-17 DIAGNOSIS — R05 Cough: Secondary | ICD-10-CM | POA: Diagnosis not present

## 2013-12-17 DIAGNOSIS — Z6829 Body mass index (BMI) 29.0-29.9, adult: Secondary | ICD-10-CM | POA: Diagnosis not present

## 2013-12-17 DIAGNOSIS — S299XXA Unspecified injury of thorax, initial encounter: Secondary | ICD-10-CM | POA: Diagnosis not present

## 2013-12-17 DIAGNOSIS — R0781 Pleurodynia: Secondary | ICD-10-CM | POA: Diagnosis not present

## 2013-12-17 DIAGNOSIS — J209 Acute bronchitis, unspecified: Secondary | ICD-10-CM | POA: Diagnosis not present

## 2013-12-18 DIAGNOSIS — R262 Difficulty in walking, not elsewhere classified: Secondary | ICD-10-CM | POA: Diagnosis not present

## 2013-12-18 DIAGNOSIS — M4804 Spinal stenosis, thoracic region: Secondary | ICD-10-CM | POA: Diagnosis not present

## 2013-12-18 DIAGNOSIS — M4325 Fusion of spine, thoracolumbar region: Secondary | ICD-10-CM | POA: Diagnosis not present

## 2013-12-18 DIAGNOSIS — M6281 Muscle weakness (generalized): Secondary | ICD-10-CM | POA: Diagnosis not present

## 2013-12-20 DIAGNOSIS — M4804 Spinal stenosis, thoracic region: Secondary | ICD-10-CM | POA: Diagnosis not present

## 2013-12-20 DIAGNOSIS — R262 Difficulty in walking, not elsewhere classified: Secondary | ICD-10-CM | POA: Diagnosis not present

## 2013-12-20 DIAGNOSIS — M4325 Fusion of spine, thoracolumbar region: Secondary | ICD-10-CM | POA: Diagnosis not present

## 2013-12-20 DIAGNOSIS — M6281 Muscle weakness (generalized): Secondary | ICD-10-CM | POA: Diagnosis not present

## 2013-12-25 DIAGNOSIS — M6281 Muscle weakness (generalized): Secondary | ICD-10-CM | POA: Diagnosis not present

## 2013-12-25 DIAGNOSIS — M4804 Spinal stenosis, thoracic region: Secondary | ICD-10-CM | POA: Diagnosis not present

## 2013-12-25 DIAGNOSIS — R262 Difficulty in walking, not elsewhere classified: Secondary | ICD-10-CM | POA: Diagnosis not present

## 2013-12-25 DIAGNOSIS — M4325 Fusion of spine, thoracolumbar region: Secondary | ICD-10-CM | POA: Diagnosis not present

## 2014-01-08 DIAGNOSIS — M4804 Spinal stenosis, thoracic region: Secondary | ICD-10-CM | POA: Diagnosis not present

## 2014-01-08 DIAGNOSIS — M6281 Muscle weakness (generalized): Secondary | ICD-10-CM | POA: Diagnosis not present

## 2014-01-08 DIAGNOSIS — M4325 Fusion of spine, thoracolumbar region: Secondary | ICD-10-CM | POA: Diagnosis not present

## 2014-01-08 DIAGNOSIS — R262 Difficulty in walking, not elsewhere classified: Secondary | ICD-10-CM | POA: Diagnosis not present

## 2014-01-15 DIAGNOSIS — M6281 Muscle weakness (generalized): Secondary | ICD-10-CM | POA: Diagnosis not present

## 2014-01-15 DIAGNOSIS — M4804 Spinal stenosis, thoracic region: Secondary | ICD-10-CM | POA: Diagnosis not present

## 2014-01-15 DIAGNOSIS — M4325 Fusion of spine, thoracolumbar region: Secondary | ICD-10-CM | POA: Diagnosis not present

## 2014-01-15 DIAGNOSIS — R262 Difficulty in walking, not elsewhere classified: Secondary | ICD-10-CM | POA: Diagnosis not present

## 2014-01-17 DIAGNOSIS — Z6830 Body mass index (BMI) 30.0-30.9, adult: Secondary | ICD-10-CM | POA: Diagnosis not present

## 2014-01-17 DIAGNOSIS — R062 Wheezing: Secondary | ICD-10-CM | POA: Diagnosis not present

## 2014-01-17 DIAGNOSIS — R05 Cough: Secondary | ICD-10-CM | POA: Diagnosis not present

## 2014-01-17 DIAGNOSIS — J209 Acute bronchitis, unspecified: Secondary | ICD-10-CM | POA: Diagnosis not present

## 2014-01-23 DIAGNOSIS — I10 Essential (primary) hypertension: Secondary | ICD-10-CM | POA: Diagnosis not present

## 2014-01-23 DIAGNOSIS — Z6830 Body mass index (BMI) 30.0-30.9, adult: Secondary | ICD-10-CM | POA: Diagnosis not present

## 2014-01-23 DIAGNOSIS — D539 Nutritional anemia, unspecified: Secondary | ICD-10-CM | POA: Diagnosis not present

## 2014-01-23 DIAGNOSIS — E785 Hyperlipidemia, unspecified: Secondary | ICD-10-CM | POA: Diagnosis not present

## 2014-01-23 DIAGNOSIS — J45909 Unspecified asthma, uncomplicated: Secondary | ICD-10-CM | POA: Diagnosis not present

## 2014-01-23 DIAGNOSIS — J209 Acute bronchitis, unspecified: Secondary | ICD-10-CM | POA: Diagnosis not present

## 2014-01-23 DIAGNOSIS — K219 Gastro-esophageal reflux disease without esophagitis: Secondary | ICD-10-CM | POA: Diagnosis not present

## 2014-01-25 DIAGNOSIS — Z8673 Personal history of transient ischemic attack (TIA), and cerebral infarction without residual deficits: Secondary | ICD-10-CM | POA: Diagnosis not present

## 2014-01-25 DIAGNOSIS — E780 Pure hypercholesterolemia: Secondary | ICD-10-CM | POA: Diagnosis not present

## 2014-01-25 DIAGNOSIS — R0789 Other chest pain: Secondary | ICD-10-CM | POA: Diagnosis not present

## 2014-01-25 DIAGNOSIS — R079 Chest pain, unspecified: Secondary | ICD-10-CM | POA: Diagnosis not present

## 2014-01-25 DIAGNOSIS — I10 Essential (primary) hypertension: Secondary | ICD-10-CM | POA: Diagnosis not present

## 2014-01-25 DIAGNOSIS — M549 Dorsalgia, unspecified: Secondary | ICD-10-CM | POA: Diagnosis not present

## 2014-01-25 DIAGNOSIS — S299XXA Unspecified injury of thorax, initial encounter: Secondary | ICD-10-CM | POA: Diagnosis not present

## 2014-01-28 DIAGNOSIS — R0600 Dyspnea, unspecified: Secondary | ICD-10-CM | POA: Diagnosis not present

## 2014-01-28 DIAGNOSIS — J180 Bronchopneumonia, unspecified organism: Secondary | ICD-10-CM | POA: Diagnosis not present

## 2014-01-28 DIAGNOSIS — Z111 Encounter for screening for respiratory tuberculosis: Secondary | ICD-10-CM | POA: Diagnosis not present

## 2014-01-28 DIAGNOSIS — R079 Chest pain, unspecified: Secondary | ICD-10-CM | POA: Diagnosis not present

## 2014-02-01 DIAGNOSIS — K5900 Constipation, unspecified: Secondary | ICD-10-CM | POA: Diagnosis not present

## 2014-02-01 DIAGNOSIS — J45909 Unspecified asthma, uncomplicated: Secondary | ICD-10-CM | POA: Diagnosis not present

## 2014-02-06 DIAGNOSIS — J180 Bronchopneumonia, unspecified organism: Secondary | ICD-10-CM | POA: Diagnosis not present

## 2014-02-06 DIAGNOSIS — K8020 Calculus of gallbladder without cholecystitis without obstruction: Secondary | ICD-10-CM | POA: Diagnosis not present

## 2014-02-06 DIAGNOSIS — R0781 Pleurodynia: Secondary | ICD-10-CM | POA: Diagnosis not present

## 2014-02-06 DIAGNOSIS — R05 Cough: Secondary | ICD-10-CM | POA: Diagnosis not present

## 2014-02-06 DIAGNOSIS — M8448XA Pathological fracture, other site, initial encounter for fracture: Secondary | ICD-10-CM | POA: Diagnosis not present

## 2014-02-22 DIAGNOSIS — K5900 Constipation, unspecified: Secondary | ICD-10-CM | POA: Diagnosis not present

## 2014-02-22 DIAGNOSIS — Z9181 History of falling: Secondary | ICD-10-CM | POA: Diagnosis not present

## 2014-02-22 DIAGNOSIS — R0781 Pleurodynia: Secondary | ICD-10-CM | POA: Diagnosis not present

## 2014-02-22 DIAGNOSIS — Z1389 Encounter for screening for other disorder: Secondary | ICD-10-CM | POA: Diagnosis not present

## 2014-02-22 DIAGNOSIS — R845 Abnormal microbiological findings in specimens from respiratory organs and thorax: Secondary | ICD-10-CM | POA: Diagnosis not present

## 2014-02-22 DIAGNOSIS — M810 Age-related osteoporosis without current pathological fracture: Secondary | ICD-10-CM | POA: Diagnosis not present

## 2014-02-22 DIAGNOSIS — Z6830 Body mass index (BMI) 30.0-30.9, adult: Secondary | ICD-10-CM | POA: Diagnosis not present

## 2014-03-01 DIAGNOSIS — M5030 Other cervical disc degeneration, unspecified cervical region: Secondary | ICD-10-CM | POA: Diagnosis not present

## 2014-03-01 DIAGNOSIS — M960 Pseudarthrosis after fusion or arthrodesis: Secondary | ICD-10-CM | POA: Diagnosis not present

## 2014-03-01 DIAGNOSIS — M549 Dorsalgia, unspecified: Secondary | ICD-10-CM | POA: Diagnosis not present

## 2014-03-06 DIAGNOSIS — R1010 Upper abdominal pain, unspecified: Secondary | ICD-10-CM | POA: Diagnosis not present

## 2014-03-06 DIAGNOSIS — K8020 Calculus of gallbladder without cholecystitis without obstruction: Secondary | ICD-10-CM | POA: Diagnosis not present

## 2014-03-06 DIAGNOSIS — Z6829 Body mass index (BMI) 29.0-29.9, adult: Secondary | ICD-10-CM | POA: Diagnosis not present

## 2014-03-06 DIAGNOSIS — J9811 Atelectasis: Secondary | ICD-10-CM | POA: Diagnosis not present

## 2014-03-06 DIAGNOSIS — R140 Abdominal distension (gaseous): Secondary | ICD-10-CM | POA: Diagnosis not present

## 2014-03-06 DIAGNOSIS — R1011 Right upper quadrant pain: Secondary | ICD-10-CM | POA: Diagnosis not present

## 2014-03-06 DIAGNOSIS — R110 Nausea: Secondary | ICD-10-CM | POA: Diagnosis not present

## 2014-03-21 DIAGNOSIS — R1012 Left upper quadrant pain: Secondary | ICD-10-CM | POA: Diagnosis not present

## 2014-03-21 DIAGNOSIS — K5900 Constipation, unspecified: Secondary | ICD-10-CM | POA: Diagnosis not present

## 2014-04-11 DIAGNOSIS — K648 Other hemorrhoids: Secondary | ICD-10-CM | POA: Diagnosis not present

## 2014-04-11 DIAGNOSIS — D649 Anemia, unspecified: Secondary | ICD-10-CM | POA: Diagnosis not present

## 2014-04-11 DIAGNOSIS — J45909 Unspecified asthma, uncomplicated: Secondary | ICD-10-CM | POA: Diagnosis not present

## 2014-04-11 DIAGNOSIS — J449 Chronic obstructive pulmonary disease, unspecified: Secondary | ICD-10-CM | POA: Diagnosis not present

## 2014-04-11 DIAGNOSIS — M1990 Unspecified osteoarthritis, unspecified site: Secondary | ICD-10-CM | POA: Diagnosis not present

## 2014-04-11 DIAGNOSIS — Q438 Other specified congenital malformations of intestine: Secondary | ICD-10-CM | POA: Diagnosis not present

## 2014-04-11 DIAGNOSIS — R1012 Left upper quadrant pain: Secondary | ICD-10-CM | POA: Diagnosis not present

## 2014-04-11 DIAGNOSIS — E780 Pure hypercholesterolemia: Secondary | ICD-10-CM | POA: Diagnosis not present

## 2014-04-11 DIAGNOSIS — K5730 Diverticulosis of large intestine without perforation or abscess without bleeding: Secondary | ICD-10-CM | POA: Diagnosis not present

## 2014-04-11 DIAGNOSIS — K219 Gastro-esophageal reflux disease without esophagitis: Secondary | ICD-10-CM | POA: Diagnosis not present

## 2014-04-11 DIAGNOSIS — M5030 Other cervical disc degeneration, unspecified cervical region: Secondary | ICD-10-CM | POA: Diagnosis not present

## 2014-04-11 DIAGNOSIS — K5900 Constipation, unspecified: Secondary | ICD-10-CM | POA: Diagnosis not present

## 2014-04-11 DIAGNOSIS — Z9049 Acquired absence of other specified parts of digestive tract: Secondary | ICD-10-CM | POA: Diagnosis not present

## 2014-04-11 DIAGNOSIS — K589 Irritable bowel syndrome without diarrhea: Secondary | ICD-10-CM | POA: Diagnosis not present

## 2014-04-11 DIAGNOSIS — Z8673 Personal history of transient ischemic attack (TIA), and cerebral infarction without residual deficits: Secondary | ICD-10-CM | POA: Diagnosis not present

## 2014-04-11 DIAGNOSIS — I10 Essential (primary) hypertension: Secondary | ICD-10-CM | POA: Diagnosis not present

## 2014-04-11 HISTORY — PX: COLONOSCOPY: SHX174

## 2014-05-07 DIAGNOSIS — J449 Chronic obstructive pulmonary disease, unspecified: Secondary | ICD-10-CM | POA: Diagnosis not present

## 2014-05-07 DIAGNOSIS — J45909 Unspecified asthma, uncomplicated: Secondary | ICD-10-CM | POA: Diagnosis not present

## 2014-05-13 DIAGNOSIS — M542 Cervicalgia: Secondary | ICD-10-CM | POA: Diagnosis not present

## 2014-05-13 DIAGNOSIS — M4325 Fusion of spine, thoracolumbar region: Secondary | ICD-10-CM | POA: Diagnosis not present

## 2014-05-13 DIAGNOSIS — M4326 Fusion of spine, lumbar region: Secondary | ICD-10-CM | POA: Diagnosis not present

## 2014-05-13 DIAGNOSIS — M79621 Pain in right upper arm: Secondary | ICD-10-CM | POA: Diagnosis not present

## 2014-05-15 DIAGNOSIS — M4325 Fusion of spine, thoracolumbar region: Secondary | ICD-10-CM | POA: Diagnosis not present

## 2014-05-15 DIAGNOSIS — M79621 Pain in right upper arm: Secondary | ICD-10-CM | POA: Diagnosis not present

## 2014-05-15 DIAGNOSIS — M4326 Fusion of spine, lumbar region: Secondary | ICD-10-CM | POA: Diagnosis not present

## 2014-05-15 DIAGNOSIS — M542 Cervicalgia: Secondary | ICD-10-CM | POA: Diagnosis not present

## 2014-05-16 DIAGNOSIS — M4326 Fusion of spine, lumbar region: Secondary | ICD-10-CM | POA: Diagnosis not present

## 2014-05-16 DIAGNOSIS — M542 Cervicalgia: Secondary | ICD-10-CM | POA: Diagnosis not present

## 2014-05-16 DIAGNOSIS — M79621 Pain in right upper arm: Secondary | ICD-10-CM | POA: Diagnosis not present

## 2014-05-16 DIAGNOSIS — M4325 Fusion of spine, thoracolumbar region: Secondary | ICD-10-CM | POA: Diagnosis not present

## 2014-05-20 DIAGNOSIS — M4325 Fusion of spine, thoracolumbar region: Secondary | ICD-10-CM | POA: Diagnosis not present

## 2014-05-20 DIAGNOSIS — M4326 Fusion of spine, lumbar region: Secondary | ICD-10-CM | POA: Diagnosis not present

## 2014-05-20 DIAGNOSIS — M542 Cervicalgia: Secondary | ICD-10-CM | POA: Diagnosis not present

## 2014-05-20 DIAGNOSIS — M79621 Pain in right upper arm: Secondary | ICD-10-CM | POA: Diagnosis not present

## 2014-05-22 DIAGNOSIS — M542 Cervicalgia: Secondary | ICD-10-CM | POA: Diagnosis not present

## 2014-05-22 DIAGNOSIS — M4325 Fusion of spine, thoracolumbar region: Secondary | ICD-10-CM | POA: Diagnosis not present

## 2014-05-22 DIAGNOSIS — M4326 Fusion of spine, lumbar region: Secondary | ICD-10-CM | POA: Diagnosis not present

## 2014-05-22 DIAGNOSIS — M79621 Pain in right upper arm: Secondary | ICD-10-CM | POA: Diagnosis not present

## 2014-05-24 DIAGNOSIS — M4325 Fusion of spine, thoracolumbar region: Secondary | ICD-10-CM | POA: Diagnosis not present

## 2014-05-24 DIAGNOSIS — M79621 Pain in right upper arm: Secondary | ICD-10-CM | POA: Diagnosis not present

## 2014-05-24 DIAGNOSIS — M542 Cervicalgia: Secondary | ICD-10-CM | POA: Diagnosis not present

## 2014-05-24 DIAGNOSIS — M4326 Fusion of spine, lumbar region: Secondary | ICD-10-CM | POA: Diagnosis not present

## 2014-05-27 DIAGNOSIS — M542 Cervicalgia: Secondary | ICD-10-CM | POA: Diagnosis not present

## 2014-05-27 DIAGNOSIS — M79621 Pain in right upper arm: Secondary | ICD-10-CM | POA: Diagnosis not present

## 2014-05-27 DIAGNOSIS — M4326 Fusion of spine, lumbar region: Secondary | ICD-10-CM | POA: Diagnosis not present

## 2014-05-27 DIAGNOSIS — M4325 Fusion of spine, thoracolumbar region: Secondary | ICD-10-CM | POA: Diagnosis not present

## 2014-05-29 DIAGNOSIS — Z1231 Encounter for screening mammogram for malignant neoplasm of breast: Secondary | ICD-10-CM | POA: Diagnosis not present

## 2014-05-29 DIAGNOSIS — M810 Age-related osteoporosis without current pathological fracture: Secondary | ICD-10-CM | POA: Diagnosis not present

## 2014-05-29 DIAGNOSIS — M4325 Fusion of spine, thoracolumbar region: Secondary | ICD-10-CM | POA: Diagnosis not present

## 2014-05-29 DIAGNOSIS — M4326 Fusion of spine, lumbar region: Secondary | ICD-10-CM | POA: Diagnosis not present

## 2014-05-29 DIAGNOSIS — M79621 Pain in right upper arm: Secondary | ICD-10-CM | POA: Diagnosis not present

## 2014-05-29 DIAGNOSIS — K219 Gastro-esophageal reflux disease without esophagitis: Secondary | ICD-10-CM | POA: Diagnosis not present

## 2014-05-29 DIAGNOSIS — I10 Essential (primary) hypertension: Secondary | ICD-10-CM | POA: Diagnosis not present

## 2014-05-29 DIAGNOSIS — Z6830 Body mass index (BMI) 30.0-30.9, adult: Secondary | ICD-10-CM | POA: Diagnosis not present

## 2014-05-29 DIAGNOSIS — D539 Nutritional anemia, unspecified: Secondary | ICD-10-CM | POA: Diagnosis not present

## 2014-05-29 DIAGNOSIS — M542 Cervicalgia: Secondary | ICD-10-CM | POA: Diagnosis not present

## 2014-05-29 DIAGNOSIS — E785 Hyperlipidemia, unspecified: Secondary | ICD-10-CM | POA: Diagnosis not present

## 2014-05-29 DIAGNOSIS — Z23 Encounter for immunization: Secondary | ICD-10-CM | POA: Diagnosis not present

## 2014-05-29 DIAGNOSIS — M1990 Unspecified osteoarthritis, unspecified site: Secondary | ICD-10-CM | POA: Diagnosis not present

## 2014-05-30 DIAGNOSIS — M542 Cervicalgia: Secondary | ICD-10-CM | POA: Diagnosis not present

## 2014-05-30 DIAGNOSIS — M4325 Fusion of spine, thoracolumbar region: Secondary | ICD-10-CM | POA: Diagnosis not present

## 2014-05-30 DIAGNOSIS — M4326 Fusion of spine, lumbar region: Secondary | ICD-10-CM | POA: Diagnosis not present

## 2014-05-30 DIAGNOSIS — M79621 Pain in right upper arm: Secondary | ICD-10-CM | POA: Diagnosis not present

## 2014-06-03 DIAGNOSIS — M4325 Fusion of spine, thoracolumbar region: Secondary | ICD-10-CM | POA: Diagnosis not present

## 2014-06-03 DIAGNOSIS — M542 Cervicalgia: Secondary | ICD-10-CM | POA: Diagnosis not present

## 2014-06-03 DIAGNOSIS — M79621 Pain in right upper arm: Secondary | ICD-10-CM | POA: Diagnosis not present

## 2014-06-03 DIAGNOSIS — M4326 Fusion of spine, lumbar region: Secondary | ICD-10-CM | POA: Diagnosis not present

## 2014-06-05 DIAGNOSIS — M4326 Fusion of spine, lumbar region: Secondary | ICD-10-CM | POA: Diagnosis not present

## 2014-06-05 DIAGNOSIS — M79621 Pain in right upper arm: Secondary | ICD-10-CM | POA: Diagnosis not present

## 2014-06-05 DIAGNOSIS — M4325 Fusion of spine, thoracolumbar region: Secondary | ICD-10-CM | POA: Diagnosis not present

## 2014-06-05 DIAGNOSIS — M542 Cervicalgia: Secondary | ICD-10-CM | POA: Diagnosis not present

## 2014-06-07 DIAGNOSIS — M4326 Fusion of spine, lumbar region: Secondary | ICD-10-CM | POA: Diagnosis not present

## 2014-06-07 DIAGNOSIS — M542 Cervicalgia: Secondary | ICD-10-CM | POA: Diagnosis not present

## 2014-06-07 DIAGNOSIS — M79621 Pain in right upper arm: Secondary | ICD-10-CM | POA: Diagnosis not present

## 2014-06-07 DIAGNOSIS — M4325 Fusion of spine, thoracolumbar region: Secondary | ICD-10-CM | POA: Diagnosis not present

## 2014-06-10 DIAGNOSIS — M79621 Pain in right upper arm: Secondary | ICD-10-CM | POA: Diagnosis not present

## 2014-06-10 DIAGNOSIS — M4326 Fusion of spine, lumbar region: Secondary | ICD-10-CM | POA: Diagnosis not present

## 2014-06-10 DIAGNOSIS — M4325 Fusion of spine, thoracolumbar region: Secondary | ICD-10-CM | POA: Diagnosis not present

## 2014-06-10 DIAGNOSIS — M542 Cervicalgia: Secondary | ICD-10-CM | POA: Diagnosis not present

## 2014-06-14 DIAGNOSIS — M79621 Pain in right upper arm: Secondary | ICD-10-CM | POA: Diagnosis not present

## 2014-06-14 DIAGNOSIS — M542 Cervicalgia: Secondary | ICD-10-CM | POA: Diagnosis not present

## 2014-06-14 DIAGNOSIS — M4326 Fusion of spine, lumbar region: Secondary | ICD-10-CM | POA: Diagnosis not present

## 2014-06-14 DIAGNOSIS — M4325 Fusion of spine, thoracolumbar region: Secondary | ICD-10-CM | POA: Diagnosis not present

## 2014-06-18 DIAGNOSIS — Z6830 Body mass index (BMI) 30.0-30.9, adult: Secondary | ICD-10-CM | POA: Diagnosis not present

## 2014-06-18 DIAGNOSIS — N390 Urinary tract infection, site not specified: Secondary | ICD-10-CM | POA: Diagnosis not present

## 2014-06-19 DIAGNOSIS — M79621 Pain in right upper arm: Secondary | ICD-10-CM | POA: Diagnosis not present

## 2014-06-19 DIAGNOSIS — M542 Cervicalgia: Secondary | ICD-10-CM | POA: Diagnosis not present

## 2014-06-19 DIAGNOSIS — M4325 Fusion of spine, thoracolumbar region: Secondary | ICD-10-CM | POA: Diagnosis not present

## 2014-06-19 DIAGNOSIS — M4326 Fusion of spine, lumbar region: Secondary | ICD-10-CM | POA: Diagnosis not present

## 2014-06-21 DIAGNOSIS — M4326 Fusion of spine, lumbar region: Secondary | ICD-10-CM | POA: Diagnosis not present

## 2014-06-21 DIAGNOSIS — M79621 Pain in right upper arm: Secondary | ICD-10-CM | POA: Diagnosis not present

## 2014-06-21 DIAGNOSIS — M542 Cervicalgia: Secondary | ICD-10-CM | POA: Diagnosis not present

## 2014-06-21 DIAGNOSIS — M4325 Fusion of spine, thoracolumbar region: Secondary | ICD-10-CM | POA: Diagnosis not present

## 2014-06-24 DIAGNOSIS — M79621 Pain in right upper arm: Secondary | ICD-10-CM | POA: Diagnosis not present

## 2014-06-24 DIAGNOSIS — M4326 Fusion of spine, lumbar region: Secondary | ICD-10-CM | POA: Diagnosis not present

## 2014-06-24 DIAGNOSIS — M4325 Fusion of spine, thoracolumbar region: Secondary | ICD-10-CM | POA: Diagnosis not present

## 2014-06-24 DIAGNOSIS — M542 Cervicalgia: Secondary | ICD-10-CM | POA: Diagnosis not present

## 2014-06-27 DIAGNOSIS — M542 Cervicalgia: Secondary | ICD-10-CM | POA: Diagnosis not present

## 2014-06-27 DIAGNOSIS — M4326 Fusion of spine, lumbar region: Secondary | ICD-10-CM | POA: Diagnosis not present

## 2014-06-27 DIAGNOSIS — M79621 Pain in right upper arm: Secondary | ICD-10-CM | POA: Diagnosis not present

## 2014-06-27 DIAGNOSIS — M4325 Fusion of spine, thoracolumbar region: Secondary | ICD-10-CM | POA: Diagnosis not present

## 2014-07-01 DIAGNOSIS — M79621 Pain in right upper arm: Secondary | ICD-10-CM | POA: Diagnosis not present

## 2014-07-01 DIAGNOSIS — M542 Cervicalgia: Secondary | ICD-10-CM | POA: Diagnosis not present

## 2014-07-01 DIAGNOSIS — M4325 Fusion of spine, thoracolumbar region: Secondary | ICD-10-CM | POA: Diagnosis not present

## 2014-07-01 DIAGNOSIS — M4326 Fusion of spine, lumbar region: Secondary | ICD-10-CM | POA: Diagnosis not present

## 2014-07-08 DIAGNOSIS — M4325 Fusion of spine, thoracolumbar region: Secondary | ICD-10-CM | POA: Diagnosis not present

## 2014-07-08 DIAGNOSIS — M542 Cervicalgia: Secondary | ICD-10-CM | POA: Diagnosis not present

## 2014-07-08 DIAGNOSIS — M79621 Pain in right upper arm: Secondary | ICD-10-CM | POA: Diagnosis not present

## 2014-07-08 DIAGNOSIS — M4326 Fusion of spine, lumbar region: Secondary | ICD-10-CM | POA: Diagnosis not present

## 2014-07-12 DIAGNOSIS — M542 Cervicalgia: Secondary | ICD-10-CM | POA: Diagnosis not present

## 2014-07-12 DIAGNOSIS — M79621 Pain in right upper arm: Secondary | ICD-10-CM | POA: Diagnosis not present

## 2014-07-12 DIAGNOSIS — M4326 Fusion of spine, lumbar region: Secondary | ICD-10-CM | POA: Diagnosis not present

## 2014-07-12 DIAGNOSIS — M4325 Fusion of spine, thoracolumbar region: Secondary | ICD-10-CM | POA: Diagnosis not present

## 2014-07-15 DIAGNOSIS — M4326 Fusion of spine, lumbar region: Secondary | ICD-10-CM | POA: Diagnosis not present

## 2014-07-15 DIAGNOSIS — M4325 Fusion of spine, thoracolumbar region: Secondary | ICD-10-CM | POA: Diagnosis not present

## 2014-07-15 DIAGNOSIS — M79621 Pain in right upper arm: Secondary | ICD-10-CM | POA: Diagnosis not present

## 2014-07-15 DIAGNOSIS — M542 Cervicalgia: Secondary | ICD-10-CM | POA: Diagnosis not present

## 2014-07-19 DIAGNOSIS — M79621 Pain in right upper arm: Secondary | ICD-10-CM | POA: Diagnosis not present

## 2014-07-19 DIAGNOSIS — M542 Cervicalgia: Secondary | ICD-10-CM | POA: Diagnosis not present

## 2014-07-19 DIAGNOSIS — M4326 Fusion of spine, lumbar region: Secondary | ICD-10-CM | POA: Diagnosis not present

## 2014-07-19 DIAGNOSIS — M4325 Fusion of spine, thoracolumbar region: Secondary | ICD-10-CM | POA: Diagnosis not present

## 2014-07-23 DIAGNOSIS — M79621 Pain in right upper arm: Secondary | ICD-10-CM | POA: Diagnosis not present

## 2014-07-23 DIAGNOSIS — M542 Cervicalgia: Secondary | ICD-10-CM | POA: Diagnosis not present

## 2014-07-23 DIAGNOSIS — M4325 Fusion of spine, thoracolumbar region: Secondary | ICD-10-CM | POA: Diagnosis not present

## 2014-07-23 DIAGNOSIS — M4326 Fusion of spine, lumbar region: Secondary | ICD-10-CM | POA: Diagnosis not present

## 2014-07-26 DIAGNOSIS — M4325 Fusion of spine, thoracolumbar region: Secondary | ICD-10-CM | POA: Diagnosis not present

## 2014-07-26 DIAGNOSIS — M542 Cervicalgia: Secondary | ICD-10-CM | POA: Diagnosis not present

## 2014-07-26 DIAGNOSIS — M4326 Fusion of spine, lumbar region: Secondary | ICD-10-CM | POA: Diagnosis not present

## 2014-07-26 DIAGNOSIS — M79621 Pain in right upper arm: Secondary | ICD-10-CM | POA: Diagnosis not present

## 2014-07-30 DIAGNOSIS — M79621 Pain in right upper arm: Secondary | ICD-10-CM | POA: Diagnosis not present

## 2014-07-30 DIAGNOSIS — M4326 Fusion of spine, lumbar region: Secondary | ICD-10-CM | POA: Diagnosis not present

## 2014-07-30 DIAGNOSIS — M4325 Fusion of spine, thoracolumbar region: Secondary | ICD-10-CM | POA: Diagnosis not present

## 2014-07-30 DIAGNOSIS — M542 Cervicalgia: Secondary | ICD-10-CM | POA: Diagnosis not present

## 2014-08-02 DIAGNOSIS — M542 Cervicalgia: Secondary | ICD-10-CM | POA: Diagnosis not present

## 2014-08-02 DIAGNOSIS — M4326 Fusion of spine, lumbar region: Secondary | ICD-10-CM | POA: Diagnosis not present

## 2014-08-02 DIAGNOSIS — M4325 Fusion of spine, thoracolumbar region: Secondary | ICD-10-CM | POA: Diagnosis not present

## 2014-08-02 DIAGNOSIS — M79621 Pain in right upper arm: Secondary | ICD-10-CM | POA: Diagnosis not present

## 2014-08-05 DIAGNOSIS — M542 Cervicalgia: Secondary | ICD-10-CM | POA: Diagnosis not present

## 2014-08-05 DIAGNOSIS — M79621 Pain in right upper arm: Secondary | ICD-10-CM | POA: Diagnosis not present

## 2014-08-05 DIAGNOSIS — M4326 Fusion of spine, lumbar region: Secondary | ICD-10-CM | POA: Diagnosis not present

## 2014-08-05 DIAGNOSIS — M4325 Fusion of spine, thoracolumbar region: Secondary | ICD-10-CM | POA: Diagnosis not present

## 2014-08-12 DIAGNOSIS — M79621 Pain in right upper arm: Secondary | ICD-10-CM | POA: Diagnosis not present

## 2014-08-12 DIAGNOSIS — M4325 Fusion of spine, thoracolumbar region: Secondary | ICD-10-CM | POA: Diagnosis not present

## 2014-08-12 DIAGNOSIS — M542 Cervicalgia: Secondary | ICD-10-CM | POA: Diagnosis not present

## 2014-08-12 DIAGNOSIS — M4326 Fusion of spine, lumbar region: Secondary | ICD-10-CM | POA: Diagnosis not present

## 2014-08-15 DIAGNOSIS — Z6830 Body mass index (BMI) 30.0-30.9, adult: Secondary | ICD-10-CM | POA: Diagnosis not present

## 2014-08-15 DIAGNOSIS — N3281 Overactive bladder: Secondary | ICD-10-CM | POA: Diagnosis not present

## 2014-08-16 DIAGNOSIS — M79621 Pain in right upper arm: Secondary | ICD-10-CM | POA: Diagnosis not present

## 2014-08-16 DIAGNOSIS — M542 Cervicalgia: Secondary | ICD-10-CM | POA: Diagnosis not present

## 2014-08-16 DIAGNOSIS — M4326 Fusion of spine, lumbar region: Secondary | ICD-10-CM | POA: Diagnosis not present

## 2014-08-16 DIAGNOSIS — M4325 Fusion of spine, thoracolumbar region: Secondary | ICD-10-CM | POA: Diagnosis not present

## 2014-08-20 DIAGNOSIS — M4326 Fusion of spine, lumbar region: Secondary | ICD-10-CM | POA: Diagnosis not present

## 2014-08-20 DIAGNOSIS — M542 Cervicalgia: Secondary | ICD-10-CM | POA: Diagnosis not present

## 2014-08-20 DIAGNOSIS — M4325 Fusion of spine, thoracolumbar region: Secondary | ICD-10-CM | POA: Diagnosis not present

## 2014-08-20 DIAGNOSIS — M79621 Pain in right upper arm: Secondary | ICD-10-CM | POA: Diagnosis not present

## 2014-08-21 DIAGNOSIS — Z1231 Encounter for screening mammogram for malignant neoplasm of breast: Secondary | ICD-10-CM | POA: Diagnosis not present

## 2014-08-22 DIAGNOSIS — M4325 Fusion of spine, thoracolumbar region: Secondary | ICD-10-CM | POA: Diagnosis not present

## 2014-08-22 DIAGNOSIS — M4326 Fusion of spine, lumbar region: Secondary | ICD-10-CM | POA: Diagnosis not present

## 2014-08-22 DIAGNOSIS — M542 Cervicalgia: Secondary | ICD-10-CM | POA: Diagnosis not present

## 2014-08-22 DIAGNOSIS — M79621 Pain in right upper arm: Secondary | ICD-10-CM | POA: Diagnosis not present

## 2014-08-28 DIAGNOSIS — M40204 Unspecified kyphosis, thoracic region: Secondary | ICD-10-CM | POA: Diagnosis not present

## 2014-08-28 DIAGNOSIS — M7062 Trochanteric bursitis, left hip: Secondary | ICD-10-CM | POA: Diagnosis not present

## 2014-08-28 DIAGNOSIS — M549 Dorsalgia, unspecified: Secondary | ICD-10-CM | POA: Diagnosis not present

## 2014-08-28 DIAGNOSIS — M4854XD Collapsed vertebra, not elsewhere classified, thoracic region, subsequent encounter for fracture with routine healing: Secondary | ICD-10-CM | POA: Diagnosis not present

## 2014-09-05 DIAGNOSIS — K219 Gastro-esophageal reflux disease without esophagitis: Secondary | ICD-10-CM | POA: Diagnosis not present

## 2014-09-05 DIAGNOSIS — Z9181 History of falling: Secondary | ICD-10-CM | POA: Diagnosis not present

## 2014-09-05 DIAGNOSIS — D539 Nutritional anemia, unspecified: Secondary | ICD-10-CM | POA: Diagnosis not present

## 2014-09-05 DIAGNOSIS — J4542 Moderate persistent asthma with status asthmaticus: Secondary | ICD-10-CM | POA: Diagnosis not present

## 2014-09-05 DIAGNOSIS — I10 Essential (primary) hypertension: Secondary | ICD-10-CM | POA: Diagnosis not present

## 2014-09-05 DIAGNOSIS — E785 Hyperlipidemia, unspecified: Secondary | ICD-10-CM | POA: Diagnosis not present

## 2014-09-05 DIAGNOSIS — R252 Cramp and spasm: Secondary | ICD-10-CM | POA: Diagnosis not present

## 2014-09-05 DIAGNOSIS — Z6829 Body mass index (BMI) 29.0-29.9, adult: Secondary | ICD-10-CM | POA: Diagnosis not present

## 2014-09-10 DIAGNOSIS — S91331A Puncture wound without foreign body, right foot, initial encounter: Secondary | ICD-10-CM | POA: Diagnosis not present

## 2014-09-10 DIAGNOSIS — S81801A Unspecified open wound, right lower leg, initial encounter: Secondary | ICD-10-CM | POA: Diagnosis not present

## 2014-09-25 DIAGNOSIS — M1990 Unspecified osteoarthritis, unspecified site: Secondary | ICD-10-CM | POA: Diagnosis not present

## 2014-09-25 DIAGNOSIS — I10 Essential (primary) hypertension: Secondary | ICD-10-CM | POA: Diagnosis not present

## 2014-09-25 DIAGNOSIS — J45909 Unspecified asthma, uncomplicated: Secondary | ICD-10-CM | POA: Diagnosis not present

## 2014-09-25 DIAGNOSIS — J449 Chronic obstructive pulmonary disease, unspecified: Secondary | ICD-10-CM | POA: Diagnosis not present

## 2014-09-25 DIAGNOSIS — S81841A Puncture wound with foreign body, right lower leg, initial encounter: Secondary | ICD-10-CM | POA: Diagnosis not present

## 2014-09-25 DIAGNOSIS — L97912 Non-pressure chronic ulcer of unspecified part of right lower leg with fat layer exposed: Secondary | ICD-10-CM | POA: Diagnosis not present

## 2014-09-25 DIAGNOSIS — S81831A Puncture wound without foreign body, right lower leg, initial encounter: Secondary | ICD-10-CM | POA: Diagnosis not present

## 2014-10-02 DIAGNOSIS — S81831D Puncture wound without foreign body, right lower leg, subsequent encounter: Secondary | ICD-10-CM | POA: Diagnosis not present

## 2014-10-02 DIAGNOSIS — L97812 Non-pressure chronic ulcer of other part of right lower leg with fat layer exposed: Secondary | ICD-10-CM | POA: Diagnosis not present

## 2014-10-09 DIAGNOSIS — S81841A Puncture wound with foreign body, right lower leg, initial encounter: Secondary | ICD-10-CM | POA: Diagnosis not present

## 2014-10-09 DIAGNOSIS — S81831D Puncture wound without foreign body, right lower leg, subsequent encounter: Secondary | ICD-10-CM | POA: Diagnosis not present

## 2014-10-09 DIAGNOSIS — L97812 Non-pressure chronic ulcer of other part of right lower leg with fat layer exposed: Secondary | ICD-10-CM | POA: Diagnosis not present

## 2014-10-10 DIAGNOSIS — R0789 Other chest pain: Secondary | ICD-10-CM | POA: Diagnosis not present

## 2014-10-10 DIAGNOSIS — I249 Acute ischemic heart disease, unspecified: Secondary | ICD-10-CM | POA: Diagnosis not present

## 2014-10-10 DIAGNOSIS — R079 Chest pain, unspecified: Secondary | ICD-10-CM | POA: Diagnosis not present

## 2014-10-10 DIAGNOSIS — K5900 Constipation, unspecified: Secondary | ICD-10-CM | POA: Diagnosis not present

## 2014-10-10 DIAGNOSIS — R072 Precordial pain: Secondary | ICD-10-CM | POA: Diagnosis not present

## 2014-10-10 DIAGNOSIS — K5901 Slow transit constipation: Secondary | ICD-10-CM | POA: Diagnosis not present

## 2014-10-10 DIAGNOSIS — R0602 Shortness of breath: Secondary | ICD-10-CM | POA: Diagnosis not present

## 2014-10-10 DIAGNOSIS — Z79899 Other long term (current) drug therapy: Secondary | ICD-10-CM | POA: Diagnosis not present

## 2014-10-10 DIAGNOSIS — E780 Pure hypercholesterolemia: Secondary | ICD-10-CM | POA: Diagnosis not present

## 2014-10-10 DIAGNOSIS — I10 Essential (primary) hypertension: Secondary | ICD-10-CM | POA: Diagnosis not present

## 2014-10-10 DIAGNOSIS — J45909 Unspecified asthma, uncomplicated: Secondary | ICD-10-CM | POA: Diagnosis not present

## 2014-10-10 DIAGNOSIS — E785 Hyperlipidemia, unspecified: Secondary | ICD-10-CM | POA: Diagnosis not present

## 2014-10-10 DIAGNOSIS — Z8673 Personal history of transient ischemic attack (TIA), and cerebral infarction without residual deficits: Secondary | ICD-10-CM | POA: Diagnosis not present

## 2014-10-10 DIAGNOSIS — Z7982 Long term (current) use of aspirin: Secondary | ICD-10-CM | POA: Diagnosis not present

## 2014-10-10 DIAGNOSIS — Z23 Encounter for immunization: Secondary | ICD-10-CM | POA: Diagnosis not present

## 2014-10-11 DIAGNOSIS — R072 Precordial pain: Secondary | ICD-10-CM | POA: Diagnosis not present

## 2014-10-11 DIAGNOSIS — I10 Essential (primary) hypertension: Secondary | ICD-10-CM | POA: Diagnosis not present

## 2014-10-11 DIAGNOSIS — E780 Pure hypercholesterolemia: Secondary | ICD-10-CM | POA: Diagnosis not present

## 2014-10-11 DIAGNOSIS — R079 Chest pain, unspecified: Secondary | ICD-10-CM | POA: Diagnosis not present

## 2014-10-11 DIAGNOSIS — K5901 Slow transit constipation: Secondary | ICD-10-CM | POA: Diagnosis not present

## 2014-10-16 DIAGNOSIS — J4542 Moderate persistent asthma with status asthmaticus: Secondary | ICD-10-CM | POA: Diagnosis not present

## 2014-10-16 DIAGNOSIS — M1990 Unspecified osteoarthritis, unspecified site: Secondary | ICD-10-CM | POA: Diagnosis not present

## 2014-10-16 DIAGNOSIS — S81831D Puncture wound without foreign body, right lower leg, subsequent encounter: Secondary | ICD-10-CM | POA: Diagnosis not present

## 2014-10-16 DIAGNOSIS — K219 Gastro-esophageal reflux disease without esophagitis: Secondary | ICD-10-CM | POA: Diagnosis not present

## 2014-10-16 DIAGNOSIS — L97812 Non-pressure chronic ulcer of other part of right lower leg with fat layer exposed: Secondary | ICD-10-CM | POA: Diagnosis not present

## 2014-10-16 DIAGNOSIS — S81841A Puncture wound with foreign body, right lower leg, initial encounter: Secondary | ICD-10-CM | POA: Diagnosis not present

## 2014-10-16 DIAGNOSIS — R079 Chest pain, unspecified: Secondary | ICD-10-CM | POA: Diagnosis not present

## 2014-10-22 DIAGNOSIS — E785 Hyperlipidemia, unspecified: Secondary | ICD-10-CM | POA: Diagnosis not present

## 2014-10-22 DIAGNOSIS — R0789 Other chest pain: Secondary | ICD-10-CM | POA: Diagnosis not present

## 2014-10-22 DIAGNOSIS — R079 Chest pain, unspecified: Secondary | ICD-10-CM | POA: Diagnosis not present

## 2014-10-22 DIAGNOSIS — I10 Essential (primary) hypertension: Secondary | ICD-10-CM | POA: Diagnosis not present

## 2014-10-23 DIAGNOSIS — S81841A Puncture wound with foreign body, right lower leg, initial encounter: Secondary | ICD-10-CM | POA: Diagnosis not present

## 2014-10-23 DIAGNOSIS — L97812 Non-pressure chronic ulcer of other part of right lower leg with fat layer exposed: Secondary | ICD-10-CM | POA: Diagnosis not present

## 2014-10-23 DIAGNOSIS — S81831D Puncture wound without foreign body, right lower leg, subsequent encounter: Secondary | ICD-10-CM | POA: Diagnosis not present

## 2014-10-23 DIAGNOSIS — E785 Hyperlipidemia, unspecified: Secondary | ICD-10-CM | POA: Diagnosis not present

## 2014-10-23 DIAGNOSIS — R079 Chest pain, unspecified: Secondary | ICD-10-CM | POA: Diagnosis not present

## 2014-10-23 DIAGNOSIS — I10 Essential (primary) hypertension: Secondary | ICD-10-CM | POA: Diagnosis not present

## 2014-10-25 DIAGNOSIS — Z90710 Acquired absence of both cervix and uterus: Secondary | ICD-10-CM | POA: Diagnosis not present

## 2014-10-25 DIAGNOSIS — R079 Chest pain, unspecified: Secondary | ICD-10-CM | POA: Diagnosis not present

## 2014-10-25 DIAGNOSIS — J45909 Unspecified asthma, uncomplicated: Secondary | ICD-10-CM | POA: Diagnosis not present

## 2014-10-25 DIAGNOSIS — Z7982 Long term (current) use of aspirin: Secondary | ICD-10-CM | POA: Diagnosis not present

## 2014-10-25 DIAGNOSIS — Z79899 Other long term (current) drug therapy: Secondary | ICD-10-CM | POA: Diagnosis not present

## 2014-10-25 DIAGNOSIS — Z9889 Other specified postprocedural states: Secondary | ICD-10-CM | POA: Diagnosis not present

## 2014-10-25 DIAGNOSIS — J9811 Atelectasis: Secondary | ICD-10-CM | POA: Diagnosis not present

## 2014-10-25 DIAGNOSIS — R61 Generalized hyperhidrosis: Secondary | ICD-10-CM | POA: Diagnosis not present

## 2014-10-25 DIAGNOSIS — K219 Gastro-esophageal reflux disease without esophagitis: Secondary | ICD-10-CM | POA: Diagnosis not present

## 2014-10-25 DIAGNOSIS — R7989 Other specified abnormal findings of blood chemistry: Secondary | ICD-10-CM | POA: Diagnosis not present

## 2014-10-25 DIAGNOSIS — R0789 Other chest pain: Secondary | ICD-10-CM | POA: Diagnosis not present

## 2014-10-25 DIAGNOSIS — R05 Cough: Secondary | ICD-10-CM | POA: Diagnosis not present

## 2014-10-25 DIAGNOSIS — R0602 Shortness of breath: Secondary | ICD-10-CM | POA: Diagnosis not present

## 2014-10-25 DIAGNOSIS — Z981 Arthrodesis status: Secondary | ICD-10-CM | POA: Diagnosis not present

## 2014-10-25 DIAGNOSIS — E785 Hyperlipidemia, unspecified: Secondary | ICD-10-CM | POA: Diagnosis not present

## 2014-10-25 DIAGNOSIS — I10 Essential (primary) hypertension: Secondary | ICD-10-CM | POA: Diagnosis not present

## 2014-10-28 DIAGNOSIS — R079 Chest pain, unspecified: Secondary | ICD-10-CM | POA: Diagnosis not present

## 2014-10-28 DIAGNOSIS — Z6831 Body mass index (BMI) 31.0-31.9, adult: Secondary | ICD-10-CM | POA: Diagnosis not present

## 2014-10-28 DIAGNOSIS — J208 Acute bronchitis due to other specified organisms: Secondary | ICD-10-CM | POA: Diagnosis not present

## 2014-10-30 DIAGNOSIS — Z87828 Personal history of other (healed) physical injury and trauma: Secondary | ICD-10-CM | POA: Diagnosis not present

## 2014-10-30 DIAGNOSIS — L97812 Non-pressure chronic ulcer of other part of right lower leg with fat layer exposed: Secondary | ICD-10-CM | POA: Diagnosis not present

## 2014-10-30 DIAGNOSIS — Z09 Encounter for follow-up examination after completed treatment for conditions other than malignant neoplasm: Secondary | ICD-10-CM | POA: Diagnosis not present

## 2014-10-30 DIAGNOSIS — Z872 Personal history of diseases of the skin and subcutaneous tissue: Secondary | ICD-10-CM | POA: Diagnosis not present

## 2014-10-30 DIAGNOSIS — S81841A Puncture wound with foreign body, right lower leg, initial encounter: Secondary | ICD-10-CM | POA: Diagnosis not present

## 2014-10-31 DIAGNOSIS — Z6830 Body mass index (BMI) 30.0-30.9, adult: Secondary | ICD-10-CM | POA: Diagnosis not present

## 2014-10-31 DIAGNOSIS — J208 Acute bronchitis due to other specified organisms: Secondary | ICD-10-CM | POA: Diagnosis not present

## 2014-11-05 DIAGNOSIS — Z7982 Long term (current) use of aspirin: Secondary | ICD-10-CM | POA: Diagnosis not present

## 2014-11-05 DIAGNOSIS — J441 Chronic obstructive pulmonary disease with (acute) exacerbation: Secondary | ICD-10-CM | POA: Diagnosis not present

## 2014-11-05 DIAGNOSIS — R05 Cough: Secondary | ICD-10-CM | POA: Diagnosis not present

## 2014-11-05 DIAGNOSIS — J209 Acute bronchitis, unspecified: Secondary | ICD-10-CM | POA: Diagnosis not present

## 2014-11-05 DIAGNOSIS — J45909 Unspecified asthma, uncomplicated: Secondary | ICD-10-CM | POA: Diagnosis not present

## 2014-11-05 DIAGNOSIS — R0602 Shortness of breath: Secondary | ICD-10-CM | POA: Diagnosis not present

## 2014-11-05 DIAGNOSIS — J449 Chronic obstructive pulmonary disease, unspecified: Secondary | ICD-10-CM | POA: Diagnosis not present

## 2014-11-05 DIAGNOSIS — J9801 Acute bronchospasm: Secondary | ICD-10-CM | POA: Diagnosis not present

## 2014-11-05 DIAGNOSIS — Z79899 Other long term (current) drug therapy: Secondary | ICD-10-CM | POA: Diagnosis not present

## 2014-11-05 DIAGNOSIS — J9811 Atelectasis: Secondary | ICD-10-CM | POA: Diagnosis not present

## 2014-11-05 DIAGNOSIS — M47894 Other spondylosis, thoracic region: Secondary | ICD-10-CM | POA: Diagnosis not present

## 2014-11-05 DIAGNOSIS — K219 Gastro-esophageal reflux disease without esophagitis: Secondary | ICD-10-CM | POA: Diagnosis not present

## 2014-12-18 DIAGNOSIS — E669 Obesity, unspecified: Secondary | ICD-10-CM | POA: Diagnosis not present

## 2014-12-18 DIAGNOSIS — I10 Essential (primary) hypertension: Secondary | ICD-10-CM | POA: Diagnosis not present

## 2014-12-18 DIAGNOSIS — D539 Nutritional anemia, unspecified: Secondary | ICD-10-CM | POA: Diagnosis not present

## 2014-12-18 DIAGNOSIS — J208 Acute bronchitis due to other specified organisms: Secondary | ICD-10-CM | POA: Diagnosis not present

## 2014-12-18 DIAGNOSIS — E785 Hyperlipidemia, unspecified: Secondary | ICD-10-CM | POA: Diagnosis not present

## 2014-12-18 DIAGNOSIS — Z6831 Body mass index (BMI) 31.0-31.9, adult: Secondary | ICD-10-CM | POA: Diagnosis not present

## 2014-12-18 DIAGNOSIS — J4542 Moderate persistent asthma with status asthmaticus: Secondary | ICD-10-CM | POA: Diagnosis not present

## 2015-02-12 DIAGNOSIS — I10 Essential (primary) hypertension: Secondary | ICD-10-CM | POA: Diagnosis not present

## 2015-02-12 DIAGNOSIS — Z961 Presence of intraocular lens: Secondary | ICD-10-CM | POA: Diagnosis not present

## 2015-02-12 DIAGNOSIS — Z9841 Cataract extraction status, right eye: Secondary | ICD-10-CM | POA: Diagnosis not present

## 2015-02-12 DIAGNOSIS — H35373 Puckering of macula, bilateral: Secondary | ICD-10-CM | POA: Diagnosis not present

## 2015-02-12 DIAGNOSIS — H5211 Myopia, right eye: Secondary | ICD-10-CM | POA: Diagnosis not present

## 2015-02-12 DIAGNOSIS — H52223 Regular astigmatism, bilateral: Secondary | ICD-10-CM | POA: Diagnosis not present

## 2015-02-12 DIAGNOSIS — H43813 Vitreous degeneration, bilateral: Secondary | ICD-10-CM | POA: Diagnosis not present

## 2015-02-12 DIAGNOSIS — H524 Presbyopia: Secondary | ICD-10-CM | POA: Diagnosis not present

## 2015-02-12 DIAGNOSIS — Z9842 Cataract extraction status, left eye: Secondary | ICD-10-CM | POA: Diagnosis not present

## 2015-02-12 DIAGNOSIS — H43393 Other vitreous opacities, bilateral: Secondary | ICD-10-CM | POA: Diagnosis not present

## 2015-02-19 DIAGNOSIS — R52 Pain, unspecified: Secondary | ICD-10-CM | POA: Diagnosis not present

## 2015-02-19 DIAGNOSIS — M7062 Trochanteric bursitis, left hip: Secondary | ICD-10-CM | POA: Diagnosis not present

## 2015-02-19 DIAGNOSIS — M4326 Fusion of spine, lumbar region: Secondary | ICD-10-CM | POA: Diagnosis not present

## 2015-02-19 DIAGNOSIS — M5136 Other intervertebral disc degeneration, lumbar region: Secondary | ICD-10-CM | POA: Diagnosis not present

## 2015-02-19 DIAGNOSIS — M545 Low back pain: Secondary | ICD-10-CM | POA: Diagnosis not present

## 2015-02-19 DIAGNOSIS — M25552 Pain in left hip: Secondary | ICD-10-CM | POA: Diagnosis not present

## 2015-03-21 DIAGNOSIS — Z0000 Encounter for general adult medical examination without abnormal findings: Secondary | ICD-10-CM | POA: Diagnosis not present

## 2015-03-21 DIAGNOSIS — Z1389 Encounter for screening for other disorder: Secondary | ICD-10-CM | POA: Diagnosis not present

## 2015-03-21 DIAGNOSIS — Z6831 Body mass index (BMI) 31.0-31.9, adult: Secondary | ICD-10-CM | POA: Diagnosis not present

## 2015-03-21 DIAGNOSIS — E669 Obesity, unspecified: Secondary | ICD-10-CM | POA: Diagnosis not present

## 2015-03-21 DIAGNOSIS — M5432 Sciatica, left side: Secondary | ICD-10-CM | POA: Diagnosis not present

## 2015-03-28 DIAGNOSIS — M1990 Unspecified osteoarthritis, unspecified site: Secondary | ICD-10-CM | POA: Diagnosis not present

## 2015-03-28 DIAGNOSIS — Z1389 Encounter for screening for other disorder: Secondary | ICD-10-CM | POA: Diagnosis not present

## 2015-03-28 DIAGNOSIS — I10 Essential (primary) hypertension: Secondary | ICD-10-CM | POA: Diagnosis not present

## 2015-03-28 DIAGNOSIS — Z6831 Body mass index (BMI) 31.0-31.9, adult: Secondary | ICD-10-CM | POA: Diagnosis not present

## 2015-03-28 DIAGNOSIS — D539 Nutritional anemia, unspecified: Secondary | ICD-10-CM | POA: Diagnosis not present

## 2015-03-28 DIAGNOSIS — E785 Hyperlipidemia, unspecified: Secondary | ICD-10-CM | POA: Diagnosis not present

## 2015-04-07 DIAGNOSIS — M8589 Other specified disorders of bone density and structure, multiple sites: Secondary | ICD-10-CM | POA: Diagnosis not present

## 2015-04-07 DIAGNOSIS — M8588 Other specified disorders of bone density and structure, other site: Secondary | ICD-10-CM | POA: Diagnosis not present

## 2015-04-24 DIAGNOSIS — H40003 Preglaucoma, unspecified, bilateral: Secondary | ICD-10-CM | POA: Diagnosis not present

## 2015-04-24 DIAGNOSIS — H04123 Dry eye syndrome of bilateral lacrimal glands: Secondary | ICD-10-CM | POA: Diagnosis not present

## 2015-05-05 DIAGNOSIS — I10 Essential (primary) hypertension: Secondary | ICD-10-CM | POA: Diagnosis not present

## 2015-05-05 DIAGNOSIS — J45909 Unspecified asthma, uncomplicated: Secondary | ICD-10-CM | POA: Diagnosis not present

## 2015-05-05 DIAGNOSIS — G629 Polyneuropathy, unspecified: Secondary | ICD-10-CM | POA: Diagnosis not present

## 2015-05-05 DIAGNOSIS — M1990 Unspecified osteoarthritis, unspecified site: Secondary | ICD-10-CM | POA: Diagnosis not present

## 2015-05-05 DIAGNOSIS — S81811A Laceration without foreign body, right lower leg, initial encounter: Secondary | ICD-10-CM | POA: Diagnosis not present

## 2015-05-05 DIAGNOSIS — J449 Chronic obstructive pulmonary disease, unspecified: Secondary | ICD-10-CM | POA: Diagnosis not present

## 2015-05-05 DIAGNOSIS — S81801A Unspecified open wound, right lower leg, initial encounter: Secondary | ICD-10-CM | POA: Diagnosis not present

## 2015-05-12 DIAGNOSIS — N390 Urinary tract infection, site not specified: Secondary | ICD-10-CM | POA: Diagnosis not present

## 2015-05-12 DIAGNOSIS — Z6831 Body mass index (BMI) 31.0-31.9, adult: Secondary | ICD-10-CM | POA: Diagnosis not present

## 2015-05-12 DIAGNOSIS — S81801D Unspecified open wound, right lower leg, subsequent encounter: Secondary | ICD-10-CM | POA: Diagnosis not present

## 2015-05-12 DIAGNOSIS — S81811A Laceration without foreign body, right lower leg, initial encounter: Secondary | ICD-10-CM | POA: Diagnosis not present

## 2015-05-19 DIAGNOSIS — S81811A Laceration without foreign body, right lower leg, initial encounter: Secondary | ICD-10-CM | POA: Diagnosis not present

## 2015-05-19 DIAGNOSIS — S81801D Unspecified open wound, right lower leg, subsequent encounter: Secondary | ICD-10-CM | POA: Diagnosis not present

## 2015-05-29 DIAGNOSIS — Z6831 Body mass index (BMI) 31.0-31.9, adult: Secondary | ICD-10-CM | POA: Diagnosis not present

## 2015-05-29 DIAGNOSIS — N390 Urinary tract infection, site not specified: Secondary | ICD-10-CM | POA: Diagnosis not present

## 2015-05-30 DIAGNOSIS — M4712 Other spondylosis with myelopathy, cervical region: Secondary | ICD-10-CM | POA: Diagnosis not present

## 2015-05-30 DIAGNOSIS — M545 Low back pain: Secondary | ICD-10-CM | POA: Diagnosis not present

## 2015-05-30 DIAGNOSIS — M963 Postlaminectomy kyphosis: Secondary | ICD-10-CM | POA: Diagnosis not present

## 2015-05-30 DIAGNOSIS — Z981 Arthrodesis status: Secondary | ICD-10-CM | POA: Diagnosis not present

## 2015-06-02 DIAGNOSIS — H40003 Preglaucoma, unspecified, bilateral: Secondary | ICD-10-CM | POA: Diagnosis not present

## 2015-06-03 DIAGNOSIS — S81811A Laceration without foreign body, right lower leg, initial encounter: Secondary | ICD-10-CM | POA: Diagnosis not present

## 2015-06-03 DIAGNOSIS — S81801D Unspecified open wound, right lower leg, subsequent encounter: Secondary | ICD-10-CM | POA: Diagnosis not present

## 2015-06-06 DIAGNOSIS — M4712 Other spondylosis with myelopathy, cervical region: Secondary | ICD-10-CM | POA: Diagnosis not present

## 2015-06-06 DIAGNOSIS — M4314 Spondylolisthesis, thoracic region: Secondary | ICD-10-CM | POA: Diagnosis not present

## 2015-06-06 DIAGNOSIS — Z981 Arthrodesis status: Secondary | ICD-10-CM | POA: Diagnosis not present

## 2015-06-06 DIAGNOSIS — M50321 Other cervical disc degeneration at C4-C5 level: Secondary | ICD-10-CM | POA: Diagnosis not present

## 2015-06-06 DIAGNOSIS — M5124 Other intervertebral disc displacement, thoracic region: Secondary | ICD-10-CM | POA: Diagnosis not present

## 2015-06-06 DIAGNOSIS — M4316 Spondylolisthesis, lumbar region: Secondary | ICD-10-CM | POA: Diagnosis not present

## 2015-06-06 DIAGNOSIS — M4855XA Collapsed vertebra, not elsewhere classified, thoracolumbar region, initial encounter for fracture: Secondary | ICD-10-CM | POA: Diagnosis not present

## 2015-06-06 DIAGNOSIS — M50323 Other cervical disc degeneration at C6-C7 level: Secondary | ICD-10-CM | POA: Diagnosis not present

## 2015-06-06 DIAGNOSIS — M963 Postlaminectomy kyphosis: Secondary | ICD-10-CM | POA: Diagnosis not present

## 2015-06-06 DIAGNOSIS — M50322 Other cervical disc degeneration at C5-C6 level: Secondary | ICD-10-CM | POA: Diagnosis not present

## 2015-06-09 DIAGNOSIS — Z09 Encounter for follow-up examination after completed treatment for conditions other than malignant neoplasm: Secondary | ICD-10-CM | POA: Diagnosis not present

## 2015-06-09 DIAGNOSIS — S81811D Laceration without foreign body, right lower leg, subsequent encounter: Secondary | ICD-10-CM | POA: Diagnosis not present

## 2015-06-09 DIAGNOSIS — Z872 Personal history of diseases of the skin and subcutaneous tissue: Secondary | ICD-10-CM | POA: Diagnosis not present

## 2015-06-18 DIAGNOSIS — M542 Cervicalgia: Secondary | ICD-10-CM | POA: Diagnosis not present

## 2015-06-18 DIAGNOSIS — M7062 Trochanteric bursitis, left hip: Secondary | ICD-10-CM | POA: Diagnosis not present

## 2015-06-23 DIAGNOSIS — M542 Cervicalgia: Secondary | ICD-10-CM | POA: Diagnosis not present

## 2015-06-23 DIAGNOSIS — M546 Pain in thoracic spine: Secondary | ICD-10-CM | POA: Diagnosis not present

## 2015-06-23 DIAGNOSIS — R262 Difficulty in walking, not elsewhere classified: Secondary | ICD-10-CM | POA: Diagnosis not present

## 2015-06-23 DIAGNOSIS — M25552 Pain in left hip: Secondary | ICD-10-CM | POA: Diagnosis not present

## 2015-06-25 DIAGNOSIS — M542 Cervicalgia: Secondary | ICD-10-CM | POA: Diagnosis not present

## 2015-06-25 DIAGNOSIS — R262 Difficulty in walking, not elsewhere classified: Secondary | ICD-10-CM | POA: Diagnosis not present

## 2015-06-25 DIAGNOSIS — M25552 Pain in left hip: Secondary | ICD-10-CM | POA: Diagnosis not present

## 2015-06-25 DIAGNOSIS — M546 Pain in thoracic spine: Secondary | ICD-10-CM | POA: Diagnosis not present

## 2015-06-27 DIAGNOSIS — M542 Cervicalgia: Secondary | ICD-10-CM | POA: Diagnosis not present

## 2015-06-27 DIAGNOSIS — M25552 Pain in left hip: Secondary | ICD-10-CM | POA: Diagnosis not present

## 2015-06-27 DIAGNOSIS — M546 Pain in thoracic spine: Secondary | ICD-10-CM | POA: Diagnosis not present

## 2015-06-27 DIAGNOSIS — R262 Difficulty in walking, not elsewhere classified: Secondary | ICD-10-CM | POA: Diagnosis not present

## 2015-06-30 DIAGNOSIS — M542 Cervicalgia: Secondary | ICD-10-CM | POA: Diagnosis not present

## 2015-06-30 DIAGNOSIS — M546 Pain in thoracic spine: Secondary | ICD-10-CM | POA: Diagnosis not present

## 2015-06-30 DIAGNOSIS — M25552 Pain in left hip: Secondary | ICD-10-CM | POA: Diagnosis not present

## 2015-06-30 DIAGNOSIS — R262 Difficulty in walking, not elsewhere classified: Secondary | ICD-10-CM | POA: Diagnosis not present

## 2015-07-02 DIAGNOSIS — M546 Pain in thoracic spine: Secondary | ICD-10-CM | POA: Diagnosis not present

## 2015-07-02 DIAGNOSIS — R262 Difficulty in walking, not elsewhere classified: Secondary | ICD-10-CM | POA: Diagnosis not present

## 2015-07-02 DIAGNOSIS — M542 Cervicalgia: Secondary | ICD-10-CM | POA: Diagnosis not present

## 2015-07-02 DIAGNOSIS — M25552 Pain in left hip: Secondary | ICD-10-CM | POA: Diagnosis not present

## 2015-07-07 DIAGNOSIS — I10 Essential (primary) hypertension: Secondary | ICD-10-CM | POA: Diagnosis not present

## 2015-07-07 DIAGNOSIS — R300 Dysuria: Secondary | ICD-10-CM | POA: Diagnosis not present

## 2015-07-07 DIAGNOSIS — R262 Difficulty in walking, not elsewhere classified: Secondary | ICD-10-CM | POA: Diagnosis not present

## 2015-07-07 DIAGNOSIS — M25552 Pain in left hip: Secondary | ICD-10-CM | POA: Diagnosis not present

## 2015-07-07 DIAGNOSIS — K219 Gastro-esophageal reflux disease without esophagitis: Secondary | ICD-10-CM | POA: Diagnosis not present

## 2015-07-07 DIAGNOSIS — Z6831 Body mass index (BMI) 31.0-31.9, adult: Secondary | ICD-10-CM | POA: Diagnosis not present

## 2015-07-07 DIAGNOSIS — J4542 Moderate persistent asthma with status asthmaticus: Secondary | ICD-10-CM | POA: Diagnosis not present

## 2015-07-07 DIAGNOSIS — M5442 Lumbago with sciatica, left side: Secondary | ICD-10-CM | POA: Diagnosis not present

## 2015-07-07 DIAGNOSIS — M542 Cervicalgia: Secondary | ICD-10-CM | POA: Diagnosis not present

## 2015-07-07 DIAGNOSIS — M546 Pain in thoracic spine: Secondary | ICD-10-CM | POA: Diagnosis not present

## 2015-07-07 DIAGNOSIS — M1990 Unspecified osteoarthritis, unspecified site: Secondary | ICD-10-CM | POA: Diagnosis not present

## 2015-07-07 DIAGNOSIS — D539 Nutritional anemia, unspecified: Secondary | ICD-10-CM | POA: Diagnosis not present

## 2015-07-07 DIAGNOSIS — E785 Hyperlipidemia, unspecified: Secondary | ICD-10-CM | POA: Diagnosis not present

## 2015-07-11 DIAGNOSIS — M25552 Pain in left hip: Secondary | ICD-10-CM | POA: Diagnosis not present

## 2015-07-11 DIAGNOSIS — M542 Cervicalgia: Secondary | ICD-10-CM | POA: Diagnosis not present

## 2015-07-11 DIAGNOSIS — R262 Difficulty in walking, not elsewhere classified: Secondary | ICD-10-CM | POA: Diagnosis not present

## 2015-07-11 DIAGNOSIS — M546 Pain in thoracic spine: Secondary | ICD-10-CM | POA: Diagnosis not present

## 2015-07-14 DIAGNOSIS — M546 Pain in thoracic spine: Secondary | ICD-10-CM | POA: Diagnosis not present

## 2015-07-14 DIAGNOSIS — M25552 Pain in left hip: Secondary | ICD-10-CM | POA: Diagnosis not present

## 2015-07-14 DIAGNOSIS — M542 Cervicalgia: Secondary | ICD-10-CM | POA: Diagnosis not present

## 2015-07-14 DIAGNOSIS — R262 Difficulty in walking, not elsewhere classified: Secondary | ICD-10-CM | POA: Diagnosis not present

## 2015-08-17 DIAGNOSIS — N23 Unspecified renal colic: Secondary | ICD-10-CM | POA: Diagnosis not present

## 2015-08-17 DIAGNOSIS — N3001 Acute cystitis with hematuria: Secondary | ICD-10-CM | POA: Diagnosis not present

## 2015-09-03 DIAGNOSIS — M549 Dorsalgia, unspecified: Secondary | ICD-10-CM | POA: Diagnosis not present

## 2015-09-03 DIAGNOSIS — Z981 Arthrodesis status: Secondary | ICD-10-CM | POA: Diagnosis not present

## 2015-09-03 DIAGNOSIS — M4804 Spinal stenosis, thoracic region: Secondary | ICD-10-CM | POA: Diagnosis not present

## 2015-09-08 DIAGNOSIS — M25552 Pain in left hip: Secondary | ICD-10-CM | POA: Diagnosis not present

## 2015-09-08 DIAGNOSIS — M8589 Other specified disorders of bone density and structure, multiple sites: Secondary | ICD-10-CM | POA: Diagnosis not present

## 2015-09-08 DIAGNOSIS — M25551 Pain in right hip: Secondary | ICD-10-CM | POA: Diagnosis not present

## 2015-09-08 DIAGNOSIS — R5381 Other malaise: Secondary | ICD-10-CM | POA: Diagnosis not present

## 2015-09-08 DIAGNOSIS — N390 Urinary tract infection, site not specified: Secondary | ICD-10-CM | POA: Diagnosis not present

## 2015-09-08 DIAGNOSIS — E559 Vitamin D deficiency, unspecified: Secondary | ICD-10-CM | POA: Diagnosis not present

## 2015-09-10 DIAGNOSIS — M5136 Other intervertebral disc degeneration, lumbar region: Secondary | ICD-10-CM | POA: Diagnosis not present

## 2015-09-10 DIAGNOSIS — M545 Low back pain: Secondary | ICD-10-CM | POA: Diagnosis not present

## 2015-09-10 DIAGNOSIS — M5430 Sciatica, unspecified side: Secondary | ICD-10-CM | POA: Diagnosis not present

## 2015-09-10 DIAGNOSIS — M4806 Spinal stenosis, lumbar region: Secondary | ICD-10-CM | POA: Diagnosis not present

## 2015-10-13 DIAGNOSIS — Z9181 History of falling: Secondary | ICD-10-CM | POA: Diagnosis not present

## 2015-10-13 DIAGNOSIS — J4542 Moderate persistent asthma with status asthmaticus: Secondary | ICD-10-CM | POA: Diagnosis not present

## 2015-10-13 DIAGNOSIS — I10 Essential (primary) hypertension: Secondary | ICD-10-CM | POA: Diagnosis not present

## 2015-10-13 DIAGNOSIS — E785 Hyperlipidemia, unspecified: Secondary | ICD-10-CM | POA: Diagnosis not present

## 2015-10-13 DIAGNOSIS — M1990 Unspecified osteoarthritis, unspecified site: Secondary | ICD-10-CM | POA: Diagnosis not present

## 2015-10-13 DIAGNOSIS — K219 Gastro-esophageal reflux disease without esophagitis: Secondary | ICD-10-CM | POA: Diagnosis not present

## 2015-10-13 DIAGNOSIS — D539 Nutritional anemia, unspecified: Secondary | ICD-10-CM | POA: Diagnosis not present

## 2015-10-21 DIAGNOSIS — M8088XD Other osteoporosis with current pathological fracture, vertebra(e), subsequent encounter for fracture with routine healing: Secondary | ICD-10-CM | POA: Diagnosis not present

## 2015-10-21 DIAGNOSIS — M8008XD Age-related osteoporosis with current pathological fracture, vertebra(e), subsequent encounter for fracture with routine healing: Secondary | ICD-10-CM

## 2015-10-21 HISTORY — DX: Age-related osteoporosis with current pathological fracture, vertebra(e), subsequent encounter for fracture with routine healing: M80.08XD

## 2015-10-23 DIAGNOSIS — M8088XD Other osteoporosis with current pathological fracture, vertebra(e), subsequent encounter for fracture with routine healing: Secondary | ICD-10-CM | POA: Diagnosis not present

## 2015-10-28 DIAGNOSIS — M8088XD Other osteoporosis with current pathological fracture, vertebra(e), subsequent encounter for fracture with routine healing: Secondary | ICD-10-CM | POA: Diagnosis not present

## 2015-11-11 DIAGNOSIS — M8008XD Age-related osteoporosis with current pathological fracture, vertebra(e), subsequent encounter for fracture with routine healing: Secondary | ICD-10-CM | POA: Diagnosis not present

## 2015-11-11 DIAGNOSIS — J449 Chronic obstructive pulmonary disease, unspecified: Secondary | ICD-10-CM | POA: Diagnosis not present

## 2015-11-20 DIAGNOSIS — E669 Obesity, unspecified: Secondary | ICD-10-CM | POA: Diagnosis not present

## 2015-11-20 DIAGNOSIS — J208 Acute bronchitis due to other specified organisms: Secondary | ICD-10-CM | POA: Diagnosis not present

## 2015-11-20 DIAGNOSIS — Z6831 Body mass index (BMI) 31.0-31.9, adult: Secondary | ICD-10-CM | POA: Diagnosis not present

## 2015-12-19 DIAGNOSIS — Z6831 Body mass index (BMI) 31.0-31.9, adult: Secondary | ICD-10-CM | POA: Diagnosis not present

## 2015-12-19 DIAGNOSIS — N390 Urinary tract infection, site not specified: Secondary | ICD-10-CM | POA: Diagnosis not present

## 2015-12-22 DIAGNOSIS — K439 Ventral hernia without obstruction or gangrene: Secondary | ICD-10-CM | POA: Diagnosis not present

## 2015-12-22 DIAGNOSIS — N390 Urinary tract infection, site not specified: Secondary | ICD-10-CM | POA: Diagnosis not present

## 2016-01-03 DIAGNOSIS — N390 Urinary tract infection, site not specified: Secondary | ICD-10-CM | POA: Diagnosis not present

## 2016-01-03 DIAGNOSIS — K439 Ventral hernia without obstruction or gangrene: Secondary | ICD-10-CM | POA: Diagnosis not present

## 2016-01-03 DIAGNOSIS — Z87440 Personal history of urinary (tract) infections: Secondary | ICD-10-CM | POA: Diagnosis not present

## 2016-01-10 DIAGNOSIS — R1084 Generalized abdominal pain: Secondary | ICD-10-CM | POA: Diagnosis not present

## 2016-01-10 DIAGNOSIS — M79606 Pain in leg, unspecified: Secondary | ICD-10-CM | POA: Diagnosis not present

## 2016-01-11 DIAGNOSIS — I10 Essential (primary) hypertension: Secondary | ICD-10-CM | POA: Diagnosis not present

## 2016-01-11 DIAGNOSIS — R109 Unspecified abdominal pain: Secondary | ICD-10-CM | POA: Diagnosis not present

## 2016-01-11 DIAGNOSIS — R112 Nausea with vomiting, unspecified: Secondary | ICD-10-CM | POA: Diagnosis not present

## 2016-01-11 DIAGNOSIS — M5030 Other cervical disc degeneration, unspecified cervical region: Secondary | ICD-10-CM | POA: Diagnosis present

## 2016-01-11 DIAGNOSIS — K859 Acute pancreatitis without necrosis or infection, unspecified: Secondary | ICD-10-CM | POA: Diagnosis not present

## 2016-01-11 DIAGNOSIS — K219 Gastro-esophageal reflux disease without esophagitis: Secondary | ICD-10-CM | POA: Diagnosis present

## 2016-01-11 DIAGNOSIS — G8929 Other chronic pain: Secondary | ICD-10-CM | POA: Diagnosis present

## 2016-01-11 DIAGNOSIS — K851 Biliary acute pancreatitis without necrosis or infection: Secondary | ICD-10-CM | POA: Diagnosis not present

## 2016-01-11 DIAGNOSIS — E785 Hyperlipidemia, unspecified: Secondary | ICD-10-CM

## 2016-01-11 DIAGNOSIS — Z8673 Personal history of transient ischemic attack (TIA), and cerebral infarction without residual deficits: Secondary | ICD-10-CM | POA: Diagnosis not present

## 2016-01-11 DIAGNOSIS — R7989 Other specified abnormal findings of blood chemistry: Secondary | ICD-10-CM | POA: Diagnosis present

## 2016-01-11 DIAGNOSIS — K8020 Calculus of gallbladder without cholecystitis without obstruction: Secondary | ICD-10-CM | POA: Diagnosis not present

## 2016-01-11 DIAGNOSIS — J449 Chronic obstructive pulmonary disease, unspecified: Secondary | ICD-10-CM | POA: Diagnosis not present

## 2016-01-11 DIAGNOSIS — Z7982 Long term (current) use of aspirin: Secondary | ICD-10-CM | POA: Diagnosis not present

## 2016-01-11 DIAGNOSIS — M542 Cervicalgia: Secondary | ICD-10-CM | POA: Diagnosis present

## 2016-01-11 DIAGNOSIS — E876 Hypokalemia: Secondary | ICD-10-CM

## 2016-01-11 DIAGNOSIS — E7800 Pure hypercholesterolemia, unspecified: Secondary | ICD-10-CM | POA: Diagnosis not present

## 2016-01-11 DIAGNOSIS — Z79899 Other long term (current) drug therapy: Secondary | ICD-10-CM | POA: Diagnosis not present

## 2016-01-11 DIAGNOSIS — A084 Viral intestinal infection, unspecified: Secondary | ICD-10-CM | POA: Diagnosis not present

## 2016-01-11 DIAGNOSIS — R197 Diarrhea, unspecified: Secondary | ICD-10-CM | POA: Diagnosis not present

## 2016-01-26 DIAGNOSIS — Z87440 Personal history of urinary (tract) infections: Secondary | ICD-10-CM | POA: Diagnosis not present

## 2016-01-26 DIAGNOSIS — K439 Ventral hernia without obstruction or gangrene: Secondary | ICD-10-CM | POA: Diagnosis not present

## 2016-01-28 DIAGNOSIS — N390 Urinary tract infection, site not specified: Secondary | ICD-10-CM | POA: Diagnosis not present

## 2016-01-28 DIAGNOSIS — A0811 Acute gastroenteropathy due to Norwalk agent: Secondary | ICD-10-CM | POA: Diagnosis not present

## 2016-01-28 DIAGNOSIS — J4542 Moderate persistent asthma with status asthmaticus: Secondary | ICD-10-CM | POA: Diagnosis not present

## 2016-01-28 DIAGNOSIS — M810 Age-related osteoporosis without current pathological fracture: Secondary | ICD-10-CM | POA: Diagnosis not present

## 2016-01-28 DIAGNOSIS — E785 Hyperlipidemia, unspecified: Secondary | ICD-10-CM | POA: Diagnosis not present

## 2016-01-28 DIAGNOSIS — I10 Essential (primary) hypertension: Secondary | ICD-10-CM | POA: Diagnosis not present

## 2016-01-28 DIAGNOSIS — Z79899 Other long term (current) drug therapy: Secondary | ICD-10-CM | POA: Diagnosis not present

## 2016-01-28 DIAGNOSIS — D539 Nutritional anemia, unspecified: Secondary | ICD-10-CM | POA: Diagnosis not present

## 2016-01-29 DIAGNOSIS — M8088XD Other osteoporosis with current pathological fracture, vertebra(e), subsequent encounter for fracture with routine healing: Secondary | ICD-10-CM | POA: Diagnosis not present

## 2016-01-29 DIAGNOSIS — M546 Pain in thoracic spine: Secondary | ICD-10-CM | POA: Diagnosis not present

## 2016-01-29 DIAGNOSIS — I341 Nonrheumatic mitral (valve) prolapse: Secondary | ICD-10-CM | POA: Diagnosis not present

## 2016-01-29 DIAGNOSIS — K4030 Unilateral inguinal hernia, with obstruction, without gangrene, not specified as recurrent: Secondary | ICD-10-CM | POA: Diagnosis not present

## 2016-01-29 DIAGNOSIS — J449 Chronic obstructive pulmonary disease, unspecified: Secondary | ICD-10-CM | POA: Diagnosis not present

## 2016-01-29 DIAGNOSIS — K219 Gastro-esophageal reflux disease without esophagitis: Secondary | ICD-10-CM | POA: Diagnosis not present

## 2016-01-29 DIAGNOSIS — I10 Essential (primary) hypertension: Secondary | ICD-10-CM | POA: Diagnosis not present

## 2016-01-29 DIAGNOSIS — N3281 Overactive bladder: Secondary | ICD-10-CM | POA: Diagnosis not present

## 2016-01-29 DIAGNOSIS — R8290 Unspecified abnormal findings in urine: Secondary | ICD-10-CM | POA: Diagnosis not present

## 2016-01-29 DIAGNOSIS — N390 Urinary tract infection, site not specified: Secondary | ICD-10-CM | POA: Diagnosis not present

## 2016-01-29 DIAGNOSIS — N362 Urethral caruncle: Secondary | ICD-10-CM | POA: Diagnosis not present

## 2016-01-29 DIAGNOSIS — G8929 Other chronic pain: Secondary | ICD-10-CM | POA: Diagnosis not present

## 2016-01-29 DIAGNOSIS — F419 Anxiety disorder, unspecified: Secondary | ICD-10-CM | POA: Diagnosis not present

## 2016-02-10 DIAGNOSIS — Z7182 Exercise counseling: Secondary | ICD-10-CM | POA: Diagnosis not present

## 2016-02-10 DIAGNOSIS — Z01818 Encounter for other preprocedural examination: Secondary | ICD-10-CM | POA: Diagnosis not present

## 2016-02-10 DIAGNOSIS — M545 Low back pain: Secondary | ICD-10-CM | POA: Diagnosis not present

## 2016-02-10 DIAGNOSIS — Z79899 Other long term (current) drug therapy: Secondary | ICD-10-CM | POA: Diagnosis not present

## 2016-02-10 DIAGNOSIS — M8088XD Other osteoporosis with current pathological fracture, vertebra(e), subsequent encounter for fracture with routine healing: Secondary | ICD-10-CM | POA: Diagnosis not present

## 2016-02-10 DIAGNOSIS — K219 Gastro-esophageal reflux disease without esophagitis: Secondary | ICD-10-CM | POA: Diagnosis not present

## 2016-02-10 DIAGNOSIS — J449 Chronic obstructive pulmonary disease, unspecified: Secondary | ICD-10-CM | POA: Diagnosis not present

## 2016-02-10 DIAGNOSIS — Z636 Dependent relative needing care at home: Secondary | ICD-10-CM | POA: Diagnosis not present

## 2016-02-10 DIAGNOSIS — F418 Other specified anxiety disorders: Secondary | ICD-10-CM | POA: Diagnosis not present

## 2016-02-10 DIAGNOSIS — K8050 Calculus of bile duct without cholangitis or cholecystitis without obstruction: Secondary | ICD-10-CM | POA: Diagnosis not present

## 2016-02-10 DIAGNOSIS — K439 Ventral hernia without obstruction or gangrene: Secondary | ICD-10-CM | POA: Diagnosis not present

## 2016-02-10 DIAGNOSIS — H9193 Unspecified hearing loss, bilateral: Secondary | ICD-10-CM | POA: Diagnosis not present

## 2016-02-10 DIAGNOSIS — I10 Essential (primary) hypertension: Secondary | ICD-10-CM | POA: Diagnosis not present

## 2016-02-23 DIAGNOSIS — N390 Urinary tract infection, site not specified: Secondary | ICD-10-CM | POA: Diagnosis not present

## 2016-02-27 DIAGNOSIS — F419 Anxiety disorder, unspecified: Secondary | ICD-10-CM | POA: Diagnosis not present

## 2016-02-27 DIAGNOSIS — K8010 Calculus of gallbladder with chronic cholecystitis without obstruction: Secondary | ICD-10-CM | POA: Diagnosis not present

## 2016-02-27 DIAGNOSIS — K432 Incisional hernia without obstruction or gangrene: Secondary | ICD-10-CM | POA: Diagnosis not present

## 2016-02-27 DIAGNOSIS — K219 Gastro-esophageal reflux disease without esophagitis: Secondary | ICD-10-CM | POA: Diagnosis not present

## 2016-02-27 DIAGNOSIS — G629 Polyneuropathy, unspecified: Secondary | ICD-10-CM | POA: Diagnosis not present

## 2016-02-27 DIAGNOSIS — G8929 Other chronic pain: Secondary | ICD-10-CM | POA: Diagnosis not present

## 2016-02-27 DIAGNOSIS — M1990 Unspecified osteoarthritis, unspecified site: Secondary | ICD-10-CM | POA: Diagnosis not present

## 2016-02-27 DIAGNOSIS — Z888 Allergy status to other drugs, medicaments and biological substances status: Secondary | ICD-10-CM | POA: Diagnosis not present

## 2016-02-27 DIAGNOSIS — Z9889 Other specified postprocedural states: Secondary | ICD-10-CM | POA: Diagnosis not present

## 2016-02-27 DIAGNOSIS — Z90710 Acquired absence of both cervix and uterus: Secondary | ICD-10-CM | POA: Diagnosis not present

## 2016-02-27 DIAGNOSIS — Z981 Arthrodesis status: Secondary | ICD-10-CM | POA: Diagnosis not present

## 2016-02-27 DIAGNOSIS — K5900 Constipation, unspecified: Secondary | ICD-10-CM | POA: Diagnosis not present

## 2016-02-27 DIAGNOSIS — Z9109 Other allergy status, other than to drugs and biological substances: Secondary | ICD-10-CM | POA: Diagnosis not present

## 2016-02-27 DIAGNOSIS — K8020 Calculus of gallbladder without cholecystitis without obstruction: Secondary | ICD-10-CM | POA: Diagnosis not present

## 2016-02-27 DIAGNOSIS — M419 Scoliosis, unspecified: Secondary | ICD-10-CM | POA: Diagnosis not present

## 2016-02-27 DIAGNOSIS — K439 Ventral hernia without obstruction or gangrene: Secondary | ICD-10-CM | POA: Diagnosis not present

## 2016-02-27 DIAGNOSIS — K811 Chronic cholecystitis: Secondary | ICD-10-CM | POA: Diagnosis not present

## 2016-02-27 DIAGNOSIS — N390 Urinary tract infection, site not specified: Secondary | ICD-10-CM | POA: Diagnosis not present

## 2016-02-27 DIAGNOSIS — J449 Chronic obstructive pulmonary disease, unspecified: Secondary | ICD-10-CM | POA: Diagnosis not present

## 2016-02-27 DIAGNOSIS — D649 Anemia, unspecified: Secondary | ICD-10-CM | POA: Diagnosis not present

## 2016-02-27 DIAGNOSIS — M810 Age-related osteoporosis without current pathological fracture: Secondary | ICD-10-CM | POA: Diagnosis not present

## 2016-02-27 DIAGNOSIS — R490 Dysphonia: Secondary | ICD-10-CM | POA: Diagnosis not present

## 2016-02-27 DIAGNOSIS — F329 Major depressive disorder, single episode, unspecified: Secondary | ICD-10-CM | POA: Diagnosis not present

## 2016-02-27 DIAGNOSIS — I10 Essential (primary) hypertension: Secondary | ICD-10-CM | POA: Diagnosis not present

## 2016-02-27 DIAGNOSIS — Z91048 Other nonmedicinal substance allergy status: Secondary | ICD-10-CM | POA: Diagnosis not present

## 2016-02-27 DIAGNOSIS — Z780 Asymptomatic menopausal state: Secondary | ICD-10-CM | POA: Diagnosis not present

## 2016-02-27 DIAGNOSIS — I341 Nonrheumatic mitral (valve) prolapse: Secondary | ICD-10-CM | POA: Diagnosis not present

## 2016-02-27 DIAGNOSIS — N3281 Overactive bladder: Secondary | ICD-10-CM | POA: Diagnosis not present

## 2016-02-28 DIAGNOSIS — I10 Essential (primary) hypertension: Secondary | ICD-10-CM | POA: Diagnosis not present

## 2016-02-28 DIAGNOSIS — K8010 Calculus of gallbladder with chronic cholecystitis without obstruction: Secondary | ICD-10-CM | POA: Diagnosis not present

## 2016-02-28 DIAGNOSIS — N3281 Overactive bladder: Secondary | ICD-10-CM | POA: Diagnosis not present

## 2016-02-28 DIAGNOSIS — N390 Urinary tract infection, site not specified: Secondary | ICD-10-CM | POA: Diagnosis not present

## 2016-02-28 DIAGNOSIS — K439 Ventral hernia without obstruction or gangrene: Secondary | ICD-10-CM | POA: Diagnosis not present

## 2016-02-28 DIAGNOSIS — I341 Nonrheumatic mitral (valve) prolapse: Secondary | ICD-10-CM | POA: Diagnosis not present

## 2016-04-05 DIAGNOSIS — K439 Ventral hernia without obstruction or gangrene: Secondary | ICD-10-CM | POA: Diagnosis not present

## 2016-04-20 DIAGNOSIS — J449 Chronic obstructive pulmonary disease, unspecified: Secondary | ICD-10-CM | POA: Diagnosis not present

## 2016-04-20 DIAGNOSIS — N390 Urinary tract infection, site not specified: Secondary | ICD-10-CM | POA: Diagnosis not present

## 2016-04-20 DIAGNOSIS — N3281 Overactive bladder: Secondary | ICD-10-CM | POA: Diagnosis not present

## 2016-04-29 DIAGNOSIS — E785 Hyperlipidemia, unspecified: Secondary | ICD-10-CM | POA: Diagnosis not present

## 2016-04-29 DIAGNOSIS — D539 Nutritional anemia, unspecified: Secondary | ICD-10-CM | POA: Diagnosis not present

## 2016-04-29 DIAGNOSIS — M1990 Unspecified osteoarthritis, unspecified site: Secondary | ICD-10-CM | POA: Diagnosis not present

## 2016-04-29 DIAGNOSIS — I10 Essential (primary) hypertension: Secondary | ICD-10-CM | POA: Diagnosis not present

## 2016-05-11 DIAGNOSIS — Z136 Encounter for screening for cardiovascular disorders: Secondary | ICD-10-CM | POA: Diagnosis not present

## 2016-05-11 DIAGNOSIS — Z1231 Encounter for screening mammogram for malignant neoplasm of breast: Secondary | ICD-10-CM | POA: Diagnosis not present

## 2016-05-11 DIAGNOSIS — Z1389 Encounter for screening for other disorder: Secondary | ICD-10-CM | POA: Diagnosis not present

## 2016-05-11 DIAGNOSIS — Z6831 Body mass index (BMI) 31.0-31.9, adult: Secondary | ICD-10-CM | POA: Diagnosis not present

## 2016-05-11 DIAGNOSIS — Z9181 History of falling: Secondary | ICD-10-CM | POA: Diagnosis not present

## 2016-05-11 DIAGNOSIS — Z0000 Encounter for general adult medical examination without abnormal findings: Secondary | ICD-10-CM | POA: Diagnosis not present

## 2016-05-11 DIAGNOSIS — E785 Hyperlipidemia, unspecified: Secondary | ICD-10-CM | POA: Diagnosis not present

## 2016-05-18 ENCOUNTER — Ambulatory Visit (INDEPENDENT_AMBULATORY_CARE_PROVIDER_SITE_OTHER): Payer: Medicare Other | Admitting: Orthopaedic Surgery

## 2016-05-18 ENCOUNTER — Ambulatory Visit (INDEPENDENT_AMBULATORY_CARE_PROVIDER_SITE_OTHER): Payer: Medicare Other

## 2016-05-18 ENCOUNTER — Encounter (INDEPENDENT_AMBULATORY_CARE_PROVIDER_SITE_OTHER): Payer: Self-pay | Admitting: Orthopaedic Surgery

## 2016-05-18 VITALS — BP 113/79 | HR 107 | Ht 62.0 in | Wt 175.0 lb

## 2016-05-18 DIAGNOSIS — M25562 Pain in left knee: Secondary | ICD-10-CM | POA: Diagnosis not present

## 2016-05-18 MED ORDER — METHYLPREDNISOLONE ACETATE 40 MG/ML IJ SUSP
40.0000 mg | INTRAMUSCULAR | Status: AC | PRN
  Administered 2016-05-18: 40 mg via INTRA_ARTICULAR

## 2016-05-18 MED ORDER — BUPIVACAINE HCL 0.25 % IJ SOLN
0.6600 mL | INTRAMUSCULAR | Status: AC | PRN
  Administered 2016-05-18: .66 mL via INTRA_ARTICULAR

## 2016-05-18 MED ORDER — LIDOCAINE HCL 1 % IJ SOLN
1.0000 mL | INTRAMUSCULAR | Status: AC | PRN
  Administered 2016-05-18: 1 mL

## 2016-05-18 NOTE — Addendum Note (Signed)
Addended by: Meyer Cory on: 05/18/2016 09:34 AM   Modules accepted: Orders

## 2016-05-18 NOTE — Progress Notes (Signed)
Office Visit Note   Patient: Melinda Hartman           Date of Birth: Apr 26, 1940           MRN: [id number] Visit Date: 05/18/2016              Requested by: No referring provider defined for this encounter. PCP: Laverna Peace, NP   Assessment & Plan: Visit Diagnoses:  1. Acute pain of left knee     Plan: Knee injection performed. We'll set up for some physical therapy locally in [place] for leg strengthening and fall prevention. I'll recheck her again in 4 weeks. She may have a recurrent meniscal tear. X-rays show mild osteoarthritic changes with mild medial joint line narrowing. She can continue the ice and Tylenol.  Follow-Up Instructions: No Follow-up on file.   Orders:  Orders Placed This Encounter  Procedures  . XR Knee 1-2 Views Left   No orders of the defined types were placed in this encounter.     Procedures: Large Joint Inj Date/Time: 05/18/2016 8:58 AM Performed by: Marybelle Killings Authorized by: Marybelle Killings   Consent Given by:  Patient Site marked: the procedure site was marked   Indications:  Pain and joint swelling Location:  Knee Site:  L knee Needle Size:  22 G Needle Length:  1.5 inches Approach:  Anterolateral Ultrasound Guidance: No   Fluoroscopic Guidance: No   Arthrogram: No   Medications:  1 mL lidocaine 1 %; 40 mg methylPREDNISolone acetate 40 MG/ML; 0.66 mL bupivacaine 0.25 % Aspiration Attempted: No   Patient tolerance:  Patient tolerated the procedure well with no immediate complications     Clinical Data: No additional findings.   Subjective: Chief Complaint  Patient presents with  . Left Knee - Pain    HPI 76 year old female with chronic pain in her left knee. She had a history of falls had knee arthroscopy about 10 years ago in [place] with knee arthroscopy. She states she had a torn meniscus.: Recent fall a week and half ago with increased left knee pain. She can't walk long on her knee states by the end of the day her knee is  swollen and painful. She is ambulating with a cane. Patient's here with her sister Inez Catalina and also her husband Jeneen Rinks. Patient does not smoke or drink. Pain is worse with standing worse with stairs she gets relief with sitting. Denies fever chills. No associated back pain but she has had previous back surgery with instrument fusion.  Review of Systems  Constitutional: Negative for chills and diaphoresis.  HENT: Negative for ear discharge, ear pain and nosebleeds.   Eyes: Negative for discharge and visual disturbance.  Respiratory: Negative for cough, choking and shortness of breath.   Cardiovascular: Negative for chest pain and palpitations.  Gastrointestinal: Negative for abdominal distention and abdominal pain.  Endocrine: Negative for cold intolerance and heat intolerance.  Genitourinary: Negative for flank pain and hematuria.  Musculoskeletal:       Previous lumbar instrumented fusion. There is knee arthroscopy 10 years ago in [place] for torn meniscus she thinks medially. History of osteoporosis she states she is treated with the injections for this. Fall one and half weeks ago directly on the left knee on cement.  Skin: Negative for rash and wound.  Allergic/Immunologic: Negative for immunocompromised state.  Neurological: Negative for seizures and speech difficulty.  Hematological: Negative for adenopathy. Does not bruise/bleed easily.  Psychiatric/Behavioral: Negative for agitation and suicidal ideas.  Objective: Vital Signs: BP 113/79   Pulse (!) 107   Ht 5\' 2"  (1.575 m)   Wt 175 lb (79.4 kg)   BMI 32.01 kg/m   Physical Exam  Constitutional: She is oriented to person, place, and time. She appears well-developed.  HENT:  Head: Normocephalic.  Right Ear: External ear normal.  Left Ear: External ear normal.  Eyes: Pupils are equal, round, and reactive to light.  Neck: No tracheal deviation present. No thyromegaly present.  Cardiovascular: Normal rate.   Pulmonary/Chest:  Effort normal.  Abdominal: Soft.  Musculoskeletal:  Patient has no edema no abrasions over the left knee. No pain with hip range of motion negative straight leg raising 90. 2+ knee effusion. Some crepitus bilaterally with knee flexion and extension slightly worse left than right. Negative apprehension with the patellar subluxation. Small medial Pleak and not terribly tender. Collateral cruciate ligament exam is normal. Pes bursa is normal hamstrings are normal no palpable Baker's cyst.  Neurological: She is alert and oriented to person, place, and time.  Skin: Skin is warm and dry.  Psychiatric: She has a normal mood and affect. Her behavior is normal.    Ortho Exam  Specialty Comments:  No specialty comments available.  Imaging: Xr Knee 1-2 Views Left  Result Date: 05/18/2016 Standing AP both knees lateral left knee obtained and reviewed. This shows medial joint line narrowing worse on the left than right knee. Minimal patellofemoral spurring noted. No acute fracture. Impression: Mild osteoarthritis with medial joint line narrowing. No acute fracture after fall.    PMFS History: There are no active problems to display for this patient.  Past Medical History:  Diagnosis Date  . Acid reflux   . Arthritis   . Asthma   . Cataracts, bilateral   . H/O bladder problems   . Hypertension   . Osteoporosis   . Pneumonia     No family history on file.  Past Surgical History:  Procedure Laterality Date  . KNEE ARTHROSCOPY Left    Social History   Occupational History  . Not on file.   Social History Main Topics  . Smoking status: Never Smoker  . Smokeless tobacco: Never Used  . Alcohol use No  . Drug use: No  . Sexual activity: Not on file

## 2016-05-19 DIAGNOSIS — R2689 Other abnormalities of gait and mobility: Secondary | ICD-10-CM | POA: Diagnosis not present

## 2016-05-19 DIAGNOSIS — M25562 Pain in left knee: Secondary | ICD-10-CM | POA: Diagnosis not present

## 2016-05-19 DIAGNOSIS — M62552 Muscle wasting and atrophy, not elsewhere classified, left thigh: Secondary | ICD-10-CM | POA: Diagnosis not present

## 2016-05-20 DIAGNOSIS — M25562 Pain in left knee: Secondary | ICD-10-CM | POA: Diagnosis not present

## 2016-05-20 DIAGNOSIS — M62552 Muscle wasting and atrophy, not elsewhere classified, left thigh: Secondary | ICD-10-CM | POA: Diagnosis not present

## 2016-05-20 DIAGNOSIS — R2689 Other abnormalities of gait and mobility: Secondary | ICD-10-CM | POA: Diagnosis not present

## 2016-05-24 DIAGNOSIS — M25562 Pain in left knee: Secondary | ICD-10-CM | POA: Diagnosis not present

## 2016-05-24 DIAGNOSIS — R2689 Other abnormalities of gait and mobility: Secondary | ICD-10-CM | POA: Diagnosis not present

## 2016-05-24 DIAGNOSIS — M62552 Muscle wasting and atrophy, not elsewhere classified, left thigh: Secondary | ICD-10-CM | POA: Diagnosis not present

## 2016-05-26 DIAGNOSIS — M62552 Muscle wasting and atrophy, not elsewhere classified, left thigh: Secondary | ICD-10-CM | POA: Diagnosis not present

## 2016-05-26 DIAGNOSIS — M25562 Pain in left knee: Secondary | ICD-10-CM | POA: Diagnosis not present

## 2016-05-26 DIAGNOSIS — R2689 Other abnormalities of gait and mobility: Secondary | ICD-10-CM | POA: Diagnosis not present

## 2016-06-01 DIAGNOSIS — M25562 Pain in left knee: Secondary | ICD-10-CM | POA: Diagnosis not present

## 2016-06-01 DIAGNOSIS — M62552 Muscle wasting and atrophy, not elsewhere classified, left thigh: Secondary | ICD-10-CM | POA: Diagnosis not present

## 2016-06-01 DIAGNOSIS — R2689 Other abnormalities of gait and mobility: Secondary | ICD-10-CM | POA: Diagnosis not present

## 2016-06-03 DIAGNOSIS — R2689 Other abnormalities of gait and mobility: Secondary | ICD-10-CM | POA: Diagnosis not present

## 2016-06-03 DIAGNOSIS — M25562 Pain in left knee: Secondary | ICD-10-CM | POA: Diagnosis not present

## 2016-06-03 DIAGNOSIS — M62552 Muscle wasting and atrophy, not elsewhere classified, left thigh: Secondary | ICD-10-CM | POA: Diagnosis not present

## 2016-06-08 DIAGNOSIS — R2689 Other abnormalities of gait and mobility: Secondary | ICD-10-CM | POA: Diagnosis not present

## 2016-06-08 DIAGNOSIS — M62552 Muscle wasting and atrophy, not elsewhere classified, left thigh: Secondary | ICD-10-CM | POA: Diagnosis not present

## 2016-06-08 DIAGNOSIS — M25562 Pain in left knee: Secondary | ICD-10-CM | POA: Diagnosis not present

## 2016-06-11 DIAGNOSIS — R2689 Other abnormalities of gait and mobility: Secondary | ICD-10-CM | POA: Diagnosis not present

## 2016-06-11 DIAGNOSIS — M62552 Muscle wasting and atrophy, not elsewhere classified, left thigh: Secondary | ICD-10-CM | POA: Diagnosis not present

## 2016-06-11 DIAGNOSIS — M25562 Pain in left knee: Secondary | ICD-10-CM | POA: Diagnosis not present

## 2016-06-16 DIAGNOSIS — M62552 Muscle wasting and atrophy, not elsewhere classified, left thigh: Secondary | ICD-10-CM | POA: Diagnosis not present

## 2016-06-16 DIAGNOSIS — M25562 Pain in left knee: Secondary | ICD-10-CM | POA: Diagnosis not present

## 2016-06-16 DIAGNOSIS — R2689 Other abnormalities of gait and mobility: Secondary | ICD-10-CM | POA: Diagnosis not present

## 2016-06-18 ENCOUNTER — Ambulatory Visit (INDEPENDENT_AMBULATORY_CARE_PROVIDER_SITE_OTHER): Payer: Medicare Other | Admitting: Orthopaedic Surgery

## 2016-06-18 ENCOUNTER — Encounter (INDEPENDENT_AMBULATORY_CARE_PROVIDER_SITE_OTHER): Payer: Self-pay | Admitting: Orthopaedic Surgery

## 2016-06-18 VITALS — BP 123/78 | HR 86 | Ht 62.0 in | Wt 175.0 lb

## 2016-06-18 DIAGNOSIS — M2242 Chondromalacia patellae, left knee: Secondary | ICD-10-CM | POA: Diagnosis not present

## 2016-06-18 DIAGNOSIS — M62552 Muscle wasting and atrophy, not elsewhere classified, left thigh: Secondary | ICD-10-CM | POA: Diagnosis not present

## 2016-06-18 DIAGNOSIS — R2689 Other abnormalities of gait and mobility: Secondary | ICD-10-CM | POA: Diagnosis not present

## 2016-06-18 DIAGNOSIS — M25562 Pain in left knee: Secondary | ICD-10-CM | POA: Diagnosis not present

## 2016-06-18 NOTE — Progress Notes (Signed)
Office Visit Note   Patient: Melinda Hartman           Date of Birth: 04-Dec-1940           MRN: [id number] Visit Date: 06/18/2016              Requested by: Melinda Dandy, NP [place], [place] [address] PCP: Melinda Dandy, NP   Assessment & Plan: Visit Diagnoses:  1. Chondromalacia patellae, left knee     Plan: She canset up physical therapy should avoid doing all steps: Likely aggravating her symptoms. We reviewed the x-rays 1 skin showed more medial and lateral joint line narrowing and mild spurring. We'll check her back again in one month and we can then consider either repeating the injection or possible diagnostic imaging if she still having problems.  Follow-Up Instructions: No Follow-up on file.   Orders:  No orders of the defined types were placed in this encounter.  No orders of the defined types were placed in this encounter.     Procedures: No procedures performed   Clinical Data: No additional findings.   Subjective: Chief Complaint  Patient presents with  . Left Knee - Pain, Follow-up    HPI 76 year old female returns problems with her left knee. She's had 10 physical therapy visit she relates her pain sometimes still is a 7 or 8 out of 10 she states initially she had greater than 50% relief of her pain with intra-articular injection exactly one month ago. She still has problems with steps. She denies any associated chills or fever no history of gout.  Review of Systems 14 point review of systems updated unchanged from last month of his than as mentioned in history of present illness.   Objective: Vital Signs: BP 123/78   Pulse 86   Ht 5\' 2"  (1.575 m)   Wt 175 lb (79.4 kg)   BMI 32.01 kg/m   Physical Exam  Constitutional: She is oriented to person, place, and time. She appears well-developed.  HENT:  Head: Normocephalic.  Right Ear: External ear normal.  Left Ear: External ear normal.  Eyes: Pupils are equal, round, and  reactive to light.  Neck: No tracheal deviation present. No thyromegaly present.  Cardiovascular: Normal rate.   Pulmonary/Chest: Effort normal.  Abdominal: Soft.  Neurological: She is alert and oriented to person, place, and time.  Skin: Skin is warm and dry.  Psychiatric: She has a normal mood and affect. Her behavior is normal.    Ortho Exam patient previously lumbar instrumented fusion previous knee arthroscopy about 10 years ago for torn meniscus. She has more pain medially she has some mild crepitus with knee flexion extension slow reaching full range of motion. She's had no recent falls since her referral to physical therapy.  Specialty Comments:  No specialty comments available.  Imaging: No results found.   PMFS History: There are no active problems to display for this patient.  Past Medical History:  Diagnosis Date  . Acid reflux   . Arthritis   . Asthma   . Cataracts, bilateral   . H/O bladder problems   . Hypertension   . Osteoporosis   . Pneumonia     No family history on file.  Past Surgical History:  Procedure Laterality Date  . KNEE ARTHROSCOPY Left    Social History   Occupational History  . Not on file.   Social History Main Topics  . Smoking status: Never Smoker  .  Smokeless tobacco: Never Used  . Alcohol use No  . Drug use: No  . Sexual activity: Not on file

## 2016-06-21 DIAGNOSIS — M62552 Muscle wasting and atrophy, not elsewhere classified, left thigh: Secondary | ICD-10-CM | POA: Diagnosis not present

## 2016-06-21 DIAGNOSIS — R2689 Other abnormalities of gait and mobility: Secondary | ICD-10-CM | POA: Diagnosis not present

## 2016-06-21 DIAGNOSIS — M25562 Pain in left knee: Secondary | ICD-10-CM | POA: Diagnosis not present

## 2016-06-24 DIAGNOSIS — M62552 Muscle wasting and atrophy, not elsewhere classified, left thigh: Secondary | ICD-10-CM | POA: Diagnosis not present

## 2016-06-24 DIAGNOSIS — R2689 Other abnormalities of gait and mobility: Secondary | ICD-10-CM | POA: Diagnosis not present

## 2016-06-24 DIAGNOSIS — M25562 Pain in left knee: Secondary | ICD-10-CM | POA: Diagnosis not present

## 2016-06-25 DIAGNOSIS — H2703 Aphakia, bilateral: Secondary | ICD-10-CM | POA: Diagnosis not present

## 2016-06-28 DIAGNOSIS — M25562 Pain in left knee: Secondary | ICD-10-CM | POA: Diagnosis not present

## 2016-06-28 DIAGNOSIS — R2689 Other abnormalities of gait and mobility: Secondary | ICD-10-CM | POA: Diagnosis not present

## 2016-06-28 DIAGNOSIS — M62552 Muscle wasting and atrophy, not elsewhere classified, left thigh: Secondary | ICD-10-CM | POA: Diagnosis not present

## 2016-06-30 DIAGNOSIS — M25562 Pain in left knee: Secondary | ICD-10-CM | POA: Diagnosis not present

## 2016-06-30 DIAGNOSIS — M62552 Muscle wasting and atrophy, not elsewhere classified, left thigh: Secondary | ICD-10-CM | POA: Diagnosis not present

## 2016-06-30 DIAGNOSIS — R2689 Other abnormalities of gait and mobility: Secondary | ICD-10-CM | POA: Diagnosis not present

## 2016-07-06 DIAGNOSIS — M25562 Pain in left knee: Secondary | ICD-10-CM | POA: Diagnosis not present

## 2016-07-06 DIAGNOSIS — R2689 Other abnormalities of gait and mobility: Secondary | ICD-10-CM | POA: Diagnosis not present

## 2016-07-06 DIAGNOSIS — M62552 Muscle wasting and atrophy, not elsewhere classified, left thigh: Secondary | ICD-10-CM | POA: Diagnosis not present

## 2016-07-13 DIAGNOSIS — R2689 Other abnormalities of gait and mobility: Secondary | ICD-10-CM | POA: Diagnosis not present

## 2016-07-13 DIAGNOSIS — M62552 Muscle wasting and atrophy, not elsewhere classified, left thigh: Secondary | ICD-10-CM | POA: Diagnosis not present

## 2016-07-13 DIAGNOSIS — M25562 Pain in left knee: Secondary | ICD-10-CM | POA: Diagnosis not present

## 2016-07-20 DIAGNOSIS — M25562 Pain in left knee: Secondary | ICD-10-CM | POA: Diagnosis not present

## 2016-07-20 DIAGNOSIS — M62552 Muscle wasting and atrophy, not elsewhere classified, left thigh: Secondary | ICD-10-CM | POA: Diagnosis not present

## 2016-07-20 DIAGNOSIS — R2689 Other abnormalities of gait and mobility: Secondary | ICD-10-CM | POA: Diagnosis not present

## 2016-07-22 DIAGNOSIS — R2689 Other abnormalities of gait and mobility: Secondary | ICD-10-CM | POA: Diagnosis not present

## 2016-07-22 DIAGNOSIS — M62552 Muscle wasting and atrophy, not elsewhere classified, left thigh: Secondary | ICD-10-CM | POA: Diagnosis not present

## 2016-07-22 DIAGNOSIS — M25562 Pain in left knee: Secondary | ICD-10-CM | POA: Diagnosis not present

## 2016-07-30 DIAGNOSIS — M62552 Muscle wasting and atrophy, not elsewhere classified, left thigh: Secondary | ICD-10-CM | POA: Diagnosis not present

## 2016-07-30 DIAGNOSIS — R2689 Other abnormalities of gait and mobility: Secondary | ICD-10-CM | POA: Diagnosis not present

## 2016-07-30 DIAGNOSIS — M25562 Pain in left knee: Secondary | ICD-10-CM | POA: Diagnosis not present

## 2016-08-09 DIAGNOSIS — M818 Other osteoporosis without current pathological fracture: Secondary | ICD-10-CM | POA: Insufficient documentation

## 2016-08-09 DIAGNOSIS — Z8781 Personal history of (healed) traumatic fracture: Secondary | ICD-10-CM

## 2016-08-09 HISTORY — DX: Personal history of (healed) traumatic fracture: Z87.81

## 2016-08-09 HISTORY — DX: Other osteoporosis without current pathological fracture: M81.8

## 2016-08-10 DIAGNOSIS — Z8781 Personal history of (healed) traumatic fracture: Secondary | ICD-10-CM | POA: Diagnosis not present

## 2016-08-10 DIAGNOSIS — T50905A Adverse effect of unspecified drugs, medicaments and biological substances, initial encounter: Secondary | ICD-10-CM | POA: Diagnosis not present

## 2016-08-10 DIAGNOSIS — M818 Other osteoporosis without current pathological fracture: Secondary | ICD-10-CM | POA: Diagnosis not present

## 2016-08-10 DIAGNOSIS — K219 Gastro-esophageal reflux disease without esophagitis: Secondary | ICD-10-CM | POA: Diagnosis not present

## 2016-08-10 DIAGNOSIS — M8088XD Other osteoporosis with current pathological fracture, vertebra(e), subsequent encounter for fracture with routine healing: Secondary | ICD-10-CM | POA: Diagnosis not present

## 2016-08-18 DIAGNOSIS — I10 Essential (primary) hypertension: Secondary | ICD-10-CM | POA: Diagnosis not present

## 2016-08-18 DIAGNOSIS — D539 Nutritional anemia, unspecified: Secondary | ICD-10-CM | POA: Diagnosis not present

## 2016-08-18 DIAGNOSIS — K219 Gastro-esophageal reflux disease without esophagitis: Secondary | ICD-10-CM | POA: Diagnosis not present

## 2016-08-18 DIAGNOSIS — E785 Hyperlipidemia, unspecified: Secondary | ICD-10-CM | POA: Diagnosis not present

## 2016-08-18 DIAGNOSIS — J4542 Moderate persistent asthma with status asthmaticus: Secondary | ICD-10-CM | POA: Diagnosis not present

## 2016-08-20 DIAGNOSIS — N390 Urinary tract infection, site not specified: Secondary | ICD-10-CM | POA: Diagnosis not present

## 2016-08-20 DIAGNOSIS — N3281 Overactive bladder: Secondary | ICD-10-CM | POA: Diagnosis not present

## 2016-10-19 DIAGNOSIS — H3530 Unspecified macular degeneration: Secondary | ICD-10-CM | POA: Diagnosis not present

## 2016-10-19 DIAGNOSIS — H2703 Aphakia, bilateral: Secondary | ICD-10-CM | POA: Diagnosis not present

## 2016-10-21 DIAGNOSIS — K219 Gastro-esophageal reflux disease without esophagitis: Secondary | ICD-10-CM | POA: Diagnosis not present

## 2016-10-21 DIAGNOSIS — J449 Chronic obstructive pulmonary disease, unspecified: Secondary | ICD-10-CM | POA: Diagnosis not present

## 2016-11-16 DIAGNOSIS — R300 Dysuria: Secondary | ICD-10-CM | POA: Diagnosis not present

## 2016-11-16 DIAGNOSIS — N390 Urinary tract infection, site not specified: Secondary | ICD-10-CM | POA: Diagnosis not present

## 2016-11-23 DIAGNOSIS — I10 Essential (primary) hypertension: Secondary | ICD-10-CM | POA: Diagnosis not present

## 2016-11-23 DIAGNOSIS — M1990 Unspecified osteoarthritis, unspecified site: Secondary | ICD-10-CM | POA: Diagnosis not present

## 2016-11-23 DIAGNOSIS — J4542 Moderate persistent asthma with status asthmaticus: Secondary | ICD-10-CM | POA: Diagnosis not present

## 2016-11-23 DIAGNOSIS — E669 Obesity, unspecified: Secondary | ICD-10-CM | POA: Diagnosis not present

## 2016-11-23 DIAGNOSIS — E785 Hyperlipidemia, unspecified: Secondary | ICD-10-CM | POA: Diagnosis not present

## 2016-11-25 DIAGNOSIS — M25562 Pain in left knee: Secondary | ICD-10-CM | POA: Diagnosis not present

## 2016-11-25 DIAGNOSIS — M1712 Unilateral primary osteoarthritis, left knee: Secondary | ICD-10-CM | POA: Diagnosis not present

## 2016-11-25 DIAGNOSIS — Z981 Arthrodesis status: Secondary | ICD-10-CM | POA: Diagnosis not present

## 2016-11-25 DIAGNOSIS — G629 Polyneuropathy, unspecified: Secondary | ICD-10-CM | POA: Diagnosis not present

## 2016-12-02 DIAGNOSIS — H353132 Nonexudative age-related macular degeneration, bilateral, intermediate dry stage: Secondary | ICD-10-CM | POA: Diagnosis not present

## 2016-12-06 DIAGNOSIS — R300 Dysuria: Secondary | ICD-10-CM | POA: Diagnosis not present

## 2016-12-06 DIAGNOSIS — N390 Urinary tract infection, site not specified: Secondary | ICD-10-CM | POA: Diagnosis not present

## 2016-12-07 DIAGNOSIS — N390 Urinary tract infection, site not specified: Secondary | ICD-10-CM | POA: Diagnosis not present

## 2016-12-07 DIAGNOSIS — R8290 Unspecified abnormal findings in urine: Secondary | ICD-10-CM | POA: Diagnosis not present

## 2016-12-17 DIAGNOSIS — Z1231 Encounter for screening mammogram for malignant neoplasm of breast: Secondary | ICD-10-CM | POA: Diagnosis not present

## 2017-01-03 DIAGNOSIS — R300 Dysuria: Secondary | ICD-10-CM | POA: Diagnosis not present

## 2017-02-21 DIAGNOSIS — N3281 Overactive bladder: Secondary | ICD-10-CM | POA: Diagnosis not present

## 2017-02-21 DIAGNOSIS — N390 Urinary tract infection, site not specified: Secondary | ICD-10-CM | POA: Diagnosis not present

## 2017-02-22 DIAGNOSIS — H26491 Other secondary cataract, right eye: Secondary | ICD-10-CM | POA: Diagnosis not present

## 2017-02-26 HISTORY — PX: HERNIA REPAIR: SHX51

## 2017-02-26 HISTORY — PX: GALLBLADDER SURGERY: SHX652

## 2017-03-08 DIAGNOSIS — H2703 Aphakia, bilateral: Secondary | ICD-10-CM | POA: Diagnosis not present

## 2017-03-08 DIAGNOSIS — H00012 Hordeolum externum right lower eyelid: Secondary | ICD-10-CM | POA: Diagnosis not present

## 2017-03-16 DIAGNOSIS — M1712 Unilateral primary osteoarthritis, left knee: Secondary | ICD-10-CM | POA: Diagnosis not present

## 2017-03-16 DIAGNOSIS — M810 Age-related osteoporosis without current pathological fracture: Secondary | ICD-10-CM | POA: Diagnosis not present

## 2017-03-16 DIAGNOSIS — H00012 Hordeolum externum right lower eyelid: Secondary | ICD-10-CM | POA: Diagnosis not present

## 2017-03-16 DIAGNOSIS — M818 Other osteoporosis without current pathological fracture: Secondary | ICD-10-CM | POA: Diagnosis not present

## 2017-03-16 DIAGNOSIS — K219 Gastro-esophageal reflux disease without esophagitis: Secondary | ICD-10-CM | POA: Diagnosis not present

## 2017-03-16 DIAGNOSIS — M47812 Spondylosis without myelopathy or radiculopathy, cervical region: Secondary | ICD-10-CM | POA: Diagnosis not present

## 2017-03-16 DIAGNOSIS — I10 Essential (primary) hypertension: Secondary | ICD-10-CM | POA: Diagnosis not present

## 2017-03-16 DIAGNOSIS — J449 Chronic obstructive pulmonary disease, unspecified: Secondary | ICD-10-CM | POA: Diagnosis not present

## 2017-03-16 DIAGNOSIS — H2703 Aphakia, bilateral: Secondary | ICD-10-CM | POA: Diagnosis not present

## 2017-03-16 DIAGNOSIS — N3281 Overactive bladder: Secondary | ICD-10-CM | POA: Diagnosis not present

## 2017-03-16 DIAGNOSIS — R112 Nausea with vomiting, unspecified: Secondary | ICD-10-CM | POA: Diagnosis not present

## 2017-03-16 DIAGNOSIS — I341 Nonrheumatic mitral (valve) prolapse: Secondary | ICD-10-CM | POA: Diagnosis not present

## 2017-03-16 DIAGNOSIS — M960 Pseudarthrosis after fusion or arthrodesis: Secondary | ICD-10-CM | POA: Diagnosis not present

## 2017-03-16 DIAGNOSIS — M8088XD Other osteoporosis with current pathological fracture, vertebra(e), subsequent encounter for fracture with routine healing: Secondary | ICD-10-CM | POA: Diagnosis not present

## 2017-03-18 DIAGNOSIS — R739 Hyperglycemia, unspecified: Secondary | ICD-10-CM | POA: Diagnosis not present

## 2017-03-18 DIAGNOSIS — D539 Nutritional anemia, unspecified: Secondary | ICD-10-CM | POA: Diagnosis not present

## 2017-03-18 DIAGNOSIS — I10 Essential (primary) hypertension: Secondary | ICD-10-CM | POA: Diagnosis not present

## 2017-03-18 DIAGNOSIS — M1990 Unspecified osteoarthritis, unspecified site: Secondary | ICD-10-CM | POA: Diagnosis not present

## 2017-03-18 DIAGNOSIS — E559 Vitamin D deficiency, unspecified: Secondary | ICD-10-CM | POA: Diagnosis not present

## 2017-03-18 DIAGNOSIS — E785 Hyperlipidemia, unspecified: Secondary | ICD-10-CM | POA: Diagnosis not present

## 2017-03-22 DIAGNOSIS — M1712 Unilateral primary osteoarthritis, left knee: Secondary | ICD-10-CM | POA: Diagnosis not present

## 2017-03-22 DIAGNOSIS — G8929 Other chronic pain: Secondary | ICD-10-CM | POA: Diagnosis not present

## 2017-03-22 DIAGNOSIS — R262 Difficulty in walking, not elsewhere classified: Secondary | ICD-10-CM | POA: Diagnosis not present

## 2017-03-22 DIAGNOSIS — M25562 Pain in left knee: Secondary | ICD-10-CM | POA: Diagnosis not present

## 2017-03-31 DIAGNOSIS — Z6832 Body mass index (BMI) 32.0-32.9, adult: Secondary | ICD-10-CM | POA: Diagnosis not present

## 2017-03-31 DIAGNOSIS — F321 Major depressive disorder, single episode, moderate: Secondary | ICD-10-CM | POA: Diagnosis not present

## 2017-03-31 DIAGNOSIS — F419 Anxiety disorder, unspecified: Secondary | ICD-10-CM | POA: Diagnosis not present

## 2017-03-31 DIAGNOSIS — N390 Urinary tract infection, site not specified: Secondary | ICD-10-CM | POA: Diagnosis not present

## 2017-03-31 DIAGNOSIS — I10 Essential (primary) hypertension: Secondary | ICD-10-CM | POA: Diagnosis not present

## 2017-03-31 DIAGNOSIS — R300 Dysuria: Secondary | ICD-10-CM | POA: Diagnosis not present

## 2017-04-06 DIAGNOSIS — F419 Anxiety disorder, unspecified: Secondary | ICD-10-CM | POA: Diagnosis not present

## 2017-04-06 DIAGNOSIS — M810 Age-related osteoporosis without current pathological fracture: Secondary | ICD-10-CM | POA: Diagnosis not present

## 2017-04-06 DIAGNOSIS — F321 Major depressive disorder, single episode, moderate: Secondary | ICD-10-CM | POA: Diagnosis not present

## 2017-04-14 DIAGNOSIS — M25562 Pain in left knee: Secondary | ICD-10-CM | POA: Diagnosis not present

## 2017-04-14 DIAGNOSIS — G8929 Other chronic pain: Secondary | ICD-10-CM | POA: Diagnosis not present

## 2017-04-14 DIAGNOSIS — R262 Difficulty in walking, not elsewhere classified: Secondary | ICD-10-CM | POA: Diagnosis not present

## 2017-04-14 DIAGNOSIS — M1712 Unilateral primary osteoarthritis, left knee: Secondary | ICD-10-CM | POA: Diagnosis not present

## 2017-04-18 DIAGNOSIS — M8589 Other specified disorders of bone density and structure, multiple sites: Secondary | ICD-10-CM | POA: Diagnosis not present

## 2017-04-21 DIAGNOSIS — Z87442 Personal history of urinary calculi: Secondary | ICD-10-CM | POA: Diagnosis not present

## 2017-04-21 DIAGNOSIS — R0982 Postnasal drip: Secondary | ICD-10-CM | POA: Diagnosis not present

## 2017-04-21 DIAGNOSIS — R1312 Dysphagia, oropharyngeal phase: Secondary | ICD-10-CM

## 2017-04-21 DIAGNOSIS — R310 Gross hematuria: Secondary | ICD-10-CM | POA: Diagnosis not present

## 2017-04-21 DIAGNOSIS — Z79899 Other long term (current) drug therapy: Secondary | ICD-10-CM | POA: Diagnosis not present

## 2017-04-21 DIAGNOSIS — K219 Gastro-esophageal reflux disease without esophagitis: Secondary | ICD-10-CM | POA: Diagnosis not present

## 2017-04-21 DIAGNOSIS — R319 Hematuria, unspecified: Secondary | ICD-10-CM | POA: Diagnosis not present

## 2017-04-21 DIAGNOSIS — T380X5D Adverse effect of glucocorticoids and synthetic analogues, subsequent encounter: Secondary | ICD-10-CM | POA: Diagnosis not present

## 2017-04-21 DIAGNOSIS — J449 Chronic obstructive pulmonary disease, unspecified: Secondary | ICD-10-CM | POA: Diagnosis not present

## 2017-04-21 DIAGNOSIS — B9620 Unspecified Escherichia coli [E. coli] as the cause of diseases classified elsewhere: Secondary | ICD-10-CM | POA: Diagnosis not present

## 2017-04-21 DIAGNOSIS — R197 Diarrhea, unspecified: Secondary | ICD-10-CM | POA: Diagnosis not present

## 2017-04-21 DIAGNOSIS — M8088XD Other osteoporosis with current pathological fracture, vertebra(e), subsequent encounter for fracture with routine healing: Secondary | ICD-10-CM | POA: Diagnosis not present

## 2017-04-21 DIAGNOSIS — J4540 Moderate persistent asthma, uncomplicated: Secondary | ICD-10-CM | POA: Diagnosis not present

## 2017-04-21 DIAGNOSIS — M818 Other osteoporosis without current pathological fracture: Secondary | ICD-10-CM | POA: Diagnosis not present

## 2017-04-21 DIAGNOSIS — I10 Essential (primary) hypertension: Secondary | ICD-10-CM | POA: Diagnosis not present

## 2017-04-21 DIAGNOSIS — N12 Tubulo-interstitial nephritis, not specified as acute or chronic: Secondary | ICD-10-CM | POA: Diagnosis not present

## 2017-04-21 DIAGNOSIS — R1310 Dysphagia, unspecified: Secondary | ICD-10-CM | POA: Diagnosis not present

## 2017-04-21 DIAGNOSIS — J45909 Unspecified asthma, uncomplicated: Secondary | ICD-10-CM | POA: Insufficient documentation

## 2017-04-21 DIAGNOSIS — T50905A Adverse effect of unspecified drugs, medicaments and biological substances, initial encounter: Secondary | ICD-10-CM | POA: Diagnosis not present

## 2017-04-21 DIAGNOSIS — M810 Age-related osteoporosis without current pathological fracture: Secondary | ICD-10-CM | POA: Diagnosis not present

## 2017-04-21 DIAGNOSIS — Z5181 Encounter for therapeutic drug level monitoring: Secondary | ICD-10-CM | POA: Diagnosis not present

## 2017-04-21 DIAGNOSIS — Z87440 Personal history of urinary (tract) infections: Secondary | ICD-10-CM | POA: Diagnosis not present

## 2017-04-21 DIAGNOSIS — R109 Unspecified abdominal pain: Secondary | ICD-10-CM | POA: Diagnosis not present

## 2017-04-21 DIAGNOSIS — N179 Acute kidney failure, unspecified: Secondary | ICD-10-CM | POA: Diagnosis not present

## 2017-04-21 HISTORY — DX: Dysphagia, oropharyngeal phase: R13.12

## 2017-04-21 HISTORY — DX: Unspecified asthma, uncomplicated: J45.909

## 2017-04-21 HISTORY — DX: Gross hematuria: R31.0

## 2017-04-22 DIAGNOSIS — N12 Tubulo-interstitial nephritis, not specified as acute or chronic: Secondary | ICD-10-CM | POA: Diagnosis not present

## 2017-04-27 DIAGNOSIS — N390 Urinary tract infection, site not specified: Secondary | ICD-10-CM | POA: Diagnosis not present

## 2017-04-27 DIAGNOSIS — N3281 Overactive bladder: Secondary | ICD-10-CM | POA: Diagnosis not present

## 2017-05-02 DIAGNOSIS — F321 Major depressive disorder, single episode, moderate: Secondary | ICD-10-CM | POA: Diagnosis not present

## 2017-05-02 DIAGNOSIS — N952 Postmenopausal atrophic vaginitis: Secondary | ICD-10-CM | POA: Diagnosis not present

## 2017-05-02 DIAGNOSIS — F419 Anxiety disorder, unspecified: Secondary | ICD-10-CM | POA: Diagnosis not present

## 2017-05-18 DIAGNOSIS — Z9181 History of falling: Secondary | ICD-10-CM | POA: Diagnosis not present

## 2017-05-18 DIAGNOSIS — Z6832 Body mass index (BMI) 32.0-32.9, adult: Secondary | ICD-10-CM | POA: Diagnosis not present

## 2017-05-18 DIAGNOSIS — Z1331 Encounter for screening for depression: Secondary | ICD-10-CM | POA: Diagnosis not present

## 2017-05-18 DIAGNOSIS — Z1231 Encounter for screening mammogram for malignant neoplasm of breast: Secondary | ICD-10-CM | POA: Diagnosis not present

## 2017-05-18 DIAGNOSIS — E785 Hyperlipidemia, unspecified: Secondary | ICD-10-CM | POA: Diagnosis not present

## 2017-05-18 DIAGNOSIS — Z0000 Encounter for general adult medical examination without abnormal findings: Secondary | ICD-10-CM | POA: Diagnosis not present

## 2017-06-16 DIAGNOSIS — M8088XD Other osteoporosis with current pathological fracture, vertebra(e), subsequent encounter for fracture with routine healing: Secondary | ICD-10-CM | POA: Diagnosis not present

## 2017-06-16 DIAGNOSIS — M818 Other osteoporosis without current pathological fracture: Secondary | ICD-10-CM | POA: Diagnosis not present

## 2017-06-16 DIAGNOSIS — I341 Nonrheumatic mitral (valve) prolapse: Secondary | ICD-10-CM | POA: Diagnosis not present

## 2017-06-16 DIAGNOSIS — M8588 Other specified disorders of bone density and structure, other site: Secondary | ICD-10-CM | POA: Diagnosis not present

## 2017-06-16 DIAGNOSIS — M1612 Unilateral primary osteoarthritis, left hip: Secondary | ICD-10-CM | POA: Diagnosis not present

## 2017-06-16 DIAGNOSIS — M810 Age-related osteoporosis without current pathological fracture: Secondary | ICD-10-CM | POA: Diagnosis not present

## 2017-06-16 DIAGNOSIS — M960 Pseudarthrosis after fusion or arthrodesis: Secondary | ICD-10-CM | POA: Diagnosis not present

## 2017-06-16 DIAGNOSIS — R1312 Dysphagia, oropharyngeal phase: Secondary | ICD-10-CM | POA: Diagnosis not present

## 2017-06-16 DIAGNOSIS — K219 Gastro-esophageal reflux disease without esophagitis: Secondary | ICD-10-CM | POA: Diagnosis not present

## 2017-06-16 DIAGNOSIS — I10 Essential (primary) hypertension: Secondary | ICD-10-CM | POA: Diagnosis not present

## 2017-06-16 DIAGNOSIS — J4540 Moderate persistent asthma, uncomplicated: Secondary | ICD-10-CM | POA: Diagnosis not present

## 2017-06-16 DIAGNOSIS — M1712 Unilateral primary osteoarthritis, left knee: Secondary | ICD-10-CM | POA: Diagnosis not present

## 2017-06-16 DIAGNOSIS — R112 Nausea with vomiting, unspecified: Secondary | ICD-10-CM | POA: Diagnosis not present

## 2017-06-16 DIAGNOSIS — M25552 Pain in left hip: Secondary | ICD-10-CM | POA: Diagnosis not present

## 2017-06-17 DIAGNOSIS — E785 Hyperlipidemia, unspecified: Secondary | ICD-10-CM | POA: Diagnosis not present

## 2017-06-17 DIAGNOSIS — R7989 Other specified abnormal findings of blood chemistry: Secondary | ICD-10-CM | POA: Diagnosis not present

## 2017-06-17 DIAGNOSIS — S0990XA Unspecified injury of head, initial encounter: Secondary | ICD-10-CM | POA: Diagnosis not present

## 2017-06-17 DIAGNOSIS — R42 Dizziness and giddiness: Secondary | ICD-10-CM | POA: Diagnosis not present

## 2017-06-17 DIAGNOSIS — R55 Syncope and collapse: Secondary | ICD-10-CM | POA: Diagnosis not present

## 2017-06-17 DIAGNOSIS — S060X9A Concussion with loss of consciousness of unspecified duration, initial encounter: Secondary | ICD-10-CM | POA: Diagnosis not present

## 2017-06-17 DIAGNOSIS — R531 Weakness: Secondary | ICD-10-CM | POA: Diagnosis not present

## 2017-06-17 DIAGNOSIS — R0602 Shortness of breath: Secondary | ICD-10-CM | POA: Diagnosis not present

## 2017-06-17 DIAGNOSIS — S3993XA Unspecified injury of pelvis, initial encounter: Secondary | ICD-10-CM | POA: Diagnosis not present

## 2017-06-17 DIAGNOSIS — Z8673 Personal history of transient ischemic attack (TIA), and cerebral infarction without residual deficits: Secondary | ICD-10-CM | POA: Diagnosis not present

## 2017-06-17 DIAGNOSIS — Z66 Do not resuscitate: Secondary | ICD-10-CM | POA: Diagnosis not present

## 2017-06-17 DIAGNOSIS — R404 Transient alteration of awareness: Secondary | ICD-10-CM | POA: Diagnosis not present

## 2017-06-17 DIAGNOSIS — G8929 Other chronic pain: Secondary | ICD-10-CM | POA: Diagnosis not present

## 2017-06-17 DIAGNOSIS — I10 Essential (primary) hypertension: Secondary | ICD-10-CM | POA: Diagnosis not present

## 2017-06-17 DIAGNOSIS — Z7982 Long term (current) use of aspirin: Secondary | ICD-10-CM | POA: Diagnosis not present

## 2017-06-17 DIAGNOSIS — Z9181 History of falling: Secondary | ICD-10-CM | POA: Diagnosis not present

## 2017-06-17 DIAGNOSIS — S060X0A Concussion without loss of consciousness, initial encounter: Secondary | ICD-10-CM | POA: Diagnosis not present

## 2017-06-17 DIAGNOSIS — S199XXA Unspecified injury of neck, initial encounter: Secondary | ICD-10-CM | POA: Diagnosis not present

## 2017-06-17 DIAGNOSIS — G4489 Other headache syndrome: Secondary | ICD-10-CM | POA: Diagnosis not present

## 2017-06-17 DIAGNOSIS — Z79899 Other long term (current) drug therapy: Secondary | ICD-10-CM | POA: Diagnosis not present

## 2017-06-17 DIAGNOSIS — M549 Dorsalgia, unspecified: Secondary | ICD-10-CM | POA: Diagnosis not present

## 2017-06-17 DIAGNOSIS — G40909 Epilepsy, unspecified, not intractable, without status epilepticus: Secondary | ICD-10-CM | POA: Diagnosis not present

## 2017-06-18 DIAGNOSIS — E785 Hyperlipidemia, unspecified: Secondary | ICD-10-CM | POA: Diagnosis not present

## 2017-06-18 DIAGNOSIS — R55 Syncope and collapse: Secondary | ICD-10-CM | POA: Diagnosis not present

## 2017-06-18 DIAGNOSIS — I10 Essential (primary) hypertension: Secondary | ICD-10-CM | POA: Diagnosis not present

## 2017-06-20 DIAGNOSIS — N390 Urinary tract infection, site not specified: Secondary | ICD-10-CM | POA: Diagnosis not present

## 2017-06-20 DIAGNOSIS — N3000 Acute cystitis without hematuria: Secondary | ICD-10-CM | POA: Diagnosis not present

## 2017-07-26 DIAGNOSIS — N390 Urinary tract infection, site not specified: Secondary | ICD-10-CM | POA: Diagnosis not present

## 2017-07-26 DIAGNOSIS — N3941 Urge incontinence: Secondary | ICD-10-CM | POA: Diagnosis not present

## 2017-07-27 DIAGNOSIS — R1310 Dysphagia, unspecified: Secondary | ICD-10-CM | POA: Diagnosis not present

## 2017-07-27 DIAGNOSIS — K219 Gastro-esophageal reflux disease without esophagitis: Secondary | ICD-10-CM | POA: Diagnosis not present

## 2017-07-27 DIAGNOSIS — R490 Dysphonia: Secondary | ICD-10-CM | POA: Diagnosis not present

## 2017-08-04 DIAGNOSIS — D539 Nutritional anemia, unspecified: Secondary | ICD-10-CM | POA: Diagnosis not present

## 2017-08-04 DIAGNOSIS — M1990 Unspecified osteoarthritis, unspecified site: Secondary | ICD-10-CM | POA: Diagnosis not present

## 2017-08-04 DIAGNOSIS — E559 Vitamin D deficiency, unspecified: Secondary | ICD-10-CM | POA: Diagnosis not present

## 2017-08-04 DIAGNOSIS — I10 Essential (primary) hypertension: Secondary | ICD-10-CM | POA: Diagnosis not present

## 2017-08-04 DIAGNOSIS — Z1339 Encounter for screening examination for other mental health and behavioral disorders: Secondary | ICD-10-CM | POA: Diagnosis not present

## 2017-08-04 DIAGNOSIS — R739 Hyperglycemia, unspecified: Secondary | ICD-10-CM | POA: Diagnosis not present

## 2017-08-04 DIAGNOSIS — E785 Hyperlipidemia, unspecified: Secondary | ICD-10-CM | POA: Diagnosis not present

## 2017-08-08 DIAGNOSIS — R05 Cough: Secondary | ICD-10-CM | POA: Diagnosis not present

## 2017-08-08 DIAGNOSIS — R1310 Dysphagia, unspecified: Secondary | ICD-10-CM | POA: Diagnosis not present

## 2017-08-08 DIAGNOSIS — R1312 Dysphagia, oropharyngeal phase: Secondary | ICD-10-CM | POA: Diagnosis not present

## 2017-08-08 DIAGNOSIS — R1313 Dysphagia, pharyngeal phase: Secondary | ICD-10-CM | POA: Diagnosis not present

## 2017-08-11 ENCOUNTER — Telehealth: Payer: Self-pay | Admitting: Gastroenterology

## 2017-08-11 ENCOUNTER — Other Ambulatory Visit: Payer: Self-pay

## 2017-08-11 NOTE — Telephone Encounter (Signed)
fluticasone cream 0.05% generic 30g 1 bid PR x 10 days, 2 refill

## 2017-08-11 NOTE — Telephone Encounter (Signed)
She called this morning with a complaint of painful hemorrhoids.  She has tried Preparation H and Recticare without any improvement.  Is there something else you can recommend or prescribe?  Thank you.

## 2017-08-12 ENCOUNTER — Other Ambulatory Visit: Payer: Self-pay

## 2017-08-12 DIAGNOSIS — K649 Unspecified hemorrhoids: Secondary | ICD-10-CM

## 2017-08-12 MED ORDER — FLUTICASONE PROPIONATE 0.05 % EX CREA
TOPICAL_CREAM | Freq: Two times a day (BID) | CUTANEOUS | 1 refills | Status: DC
Start: 2017-08-12 — End: 2021-09-30

## 2017-08-12 MED ORDER — FLUTICASONE PROPIONATE 0.05 % EX CREA: TOPICAL_CREAM | Freq: Two times a day (BID) | CUTANEOUS | 0 refills | Status: DC

## 2017-08-12 NOTE — Telephone Encounter (Signed)
I sent a new prescription for 120 gm (2-60 gm tubes) as requested and so that it will be covered by her insurance.

## 2017-08-12 NOTE — Telephone Encounter (Signed)
Patient notified by phone and prescription sent to [facility] in [place] per her request.

## 2017-08-12 NOTE — Telephone Encounter (Signed)
Allie from pharmacy calling to state pts insurance will not cover cream at 30g but will at 60g. Pharmacy wanting to know if prescription can be written for 2 bottles of cream.

## 2017-08-17 DIAGNOSIS — K219 Gastro-esophageal reflux disease without esophagitis: Secondary | ICD-10-CM | POA: Diagnosis not present

## 2017-08-17 DIAGNOSIS — R490 Dysphonia: Secondary | ICD-10-CM | POA: Diagnosis not present

## 2017-08-17 DIAGNOSIS — J381 Polyp of vocal cord and larynx: Secondary | ICD-10-CM | POA: Diagnosis not present

## 2017-08-17 DIAGNOSIS — J383 Other diseases of vocal cords: Secondary | ICD-10-CM | POA: Diagnosis not present

## 2017-08-17 DIAGNOSIS — J370 Chronic laryngitis: Secondary | ICD-10-CM | POA: Diagnosis not present

## 2017-08-23 DIAGNOSIS — M19042 Primary osteoarthritis, left hand: Secondary | ICD-10-CM | POA: Diagnosis not present

## 2017-08-23 DIAGNOSIS — M1712 Unilateral primary osteoarthritis, left knee: Secondary | ICD-10-CM | POA: Diagnosis not present

## 2017-08-23 DIAGNOSIS — M19041 Primary osteoarthritis, right hand: Secondary | ICD-10-CM | POA: Diagnosis not present

## 2017-09-01 DIAGNOSIS — K641 Second degree hemorrhoids: Secondary | ICD-10-CM | POA: Diagnosis not present

## 2017-09-21 DIAGNOSIS — N3941 Urge incontinence: Secondary | ICD-10-CM | POA: Diagnosis not present

## 2017-09-21 DIAGNOSIS — N390 Urinary tract infection, site not specified: Secondary | ICD-10-CM | POA: Diagnosis not present

## 2017-09-21 DIAGNOSIS — K641 Second degree hemorrhoids: Secondary | ICD-10-CM | POA: Diagnosis not present

## 2017-09-27 DIAGNOSIS — R1084 Generalized abdominal pain: Secondary | ICD-10-CM | POA: Diagnosis not present

## 2017-09-27 DIAGNOSIS — Z6832 Body mass index (BMI) 32.0-32.9, adult: Secondary | ICD-10-CM | POA: Diagnosis not present

## 2017-09-27 DIAGNOSIS — K644 Residual hemorrhoidal skin tags: Secondary | ICD-10-CM | POA: Diagnosis not present

## 2017-09-27 DIAGNOSIS — R194 Change in bowel habit: Secondary | ICD-10-CM | POA: Diagnosis not present

## 2017-09-29 DIAGNOSIS — J381 Polyp of vocal cord and larynx: Secondary | ICD-10-CM | POA: Diagnosis not present

## 2017-09-29 DIAGNOSIS — R490 Dysphonia: Secondary | ICD-10-CM | POA: Diagnosis not present

## 2017-09-29 DIAGNOSIS — J383 Other diseases of vocal cords: Secondary | ICD-10-CM | POA: Diagnosis not present

## 2017-09-30 ENCOUNTER — Encounter: Payer: Self-pay | Admitting: Gastroenterology

## 2017-09-30 DIAGNOSIS — R1030 Lower abdominal pain, unspecified: Secondary | ICD-10-CM | POA: Diagnosis not present

## 2017-09-30 DIAGNOSIS — R194 Change in bowel habit: Secondary | ICD-10-CM | POA: Diagnosis not present

## 2017-09-30 DIAGNOSIS — R1084 Generalized abdominal pain: Secondary | ICD-10-CM | POA: Diagnosis not present

## 2017-10-10 ENCOUNTER — Encounter: Payer: Self-pay | Admitting: Gastroenterology

## 2017-10-10 DIAGNOSIS — K641 Second degree hemorrhoids: Secondary | ICD-10-CM | POA: Diagnosis not present

## 2017-10-18 ENCOUNTER — Ambulatory Visit (INDEPENDENT_AMBULATORY_CARE_PROVIDER_SITE_OTHER): Payer: Medicare Other | Admitting: Gastroenterology

## 2017-10-18 ENCOUNTER — Encounter: Payer: Self-pay | Admitting: Gastroenterology

## 2017-10-18 VITALS — BP 122/80 | HR 80 | Ht 63.0 in | Wt 177.2 lb

## 2017-10-18 DIAGNOSIS — R197 Diarrhea, unspecified: Secondary | ICD-10-CM | POA: Diagnosis not present

## 2017-10-18 NOTE — Patient Instructions (Signed)
If you are age 77 or older, your body mass index should be between 23-30. Your Body mass index is 31.4 kg/m. If this is out of the aforementioned range listed, please consider follow up with your Primary Care Provider.  If you are age 39 or younger, your body mass index should be between 19-25. Your Body mass index is 31.4 kg/m. If this is out of the aformentioned range listed, please consider follow up with your Primary Care Provider.   Please obtain your stool sample at home and return it to the lab on the 2nd floor [address].  Stop taking Magnesium Supplements and Miralax.   Please call Dr. Leland Her nurse Vaughan Basta, RN)  in 2 weeks at [phone]  to let her now how you are doing.   Thank you,  Dr. [name]

## 2017-10-18 NOTE — Progress Notes (Signed)
Chief Complaint: diarrhea  Referring Provider:  [name], NP      ASSESSMENT AND PLAN;   #1. Diarrhea (H/O constipation in the past, likely due to medications including magnesium supplements and MiraLAX.  Could have element of postcholecystectomy diarrhea. Neg CT 2019 at [facility], neg colon 03/2014).  Plan: - GI pathogen including C. difficile, WBC - Please obtain previous records CT report 2019 from Amy) - Stop all magnesium supplements and miralax - Call in 2 weeks. - Hold off on any repeat colonoscopy in the present time as I believe that the yield will be low. - If she still has diarrhea, she would take Imodium on as needed basis.   #2. H/O hoarseness and occ dysphagia (H/O Nissen's 1996), scheduled for EGD with dil at [facility] Nov 2019. -Continue Protonix 40 mg p.o. BID. -Proceed with EGD as already scheduled. -Discussed in detail with the patient and patient's family.   HPI:    Melinda Hartman is a 77 y.o. female  Having hoarseness and occ dysphagia (H/O Nissen's 1996), scheduled for EGD with dil at [facility] Nov 2019. Seen at request of Amy Manson Allan Here for diarrhea -loose watery bowel movements, especially in the morning, 3-4 times and then gets better.No nocturnal symptoms. Occasional lower abdominal crampy pain No melena or hematochezia. No weight loss. Has history of constipation Has been taking magnesium supplements and MiraLAX as well. Last colonoscopy 04/11/2014 showed mild sigmoid diverticulosis.  Otherwise normal colonoscopy. Seen by Laverna Peace FNP -had CT scan abdomen and pelvis at [facility] recently.  We do not have records.  She was told that the CT scan was negative. Has been on antibiotics off and on for urinary tract infection.  Past Medical History:  Diagnosis Date  . Acid reflux   . Anxiety   . Arthritis   . Asthma   . Cataracts, bilateral   . COPD (chronic obstructive pulmonary disease) ([place])   . H/O bladder problems   . Hypertension   . IBS  (irritable bowel syndrome)   . Osteoporosis   . Pneumonia     Past Surgical History:  Procedure Laterality Date  . ABDOMINAL HYSTERECTOMY  1977  . APPENDECTOMY    . BACK SURGERY     x4 22 screws 2 rods  . COLONOSCOPY  04/11/2014   Mild diverticulosis. Otherwise normal colonoscopy. Highly redundant colon.   Marland Kitchen GALLBLADDER SURGERY  02/26/2017  . HERNIA REPAIR  02/26/2017  . KNEE ARTHROSCOPY Left     Family History  Problem Relation Age of Onset  . Stomach cancer Mother   . Colon cancer Neg Hx   . Esophageal cancer Neg Hx     Social History   Tobacco Use  . Smoking status: Never Smoker  . Smokeless tobacco: Never Used  Substance Use Topics  . Alcohol use: No  . Drug use: No    Current Outpatient Medications  Medication Sig Dispense Refill  . acetaminophen (TYLENOL) 325 MG tablet Take by mouth.    Marland Kitchen albuterol (PROVENTIL HFA;VENTOLIN HFA) 108 (90 Base) MCG/ACT inhaler Inhale into the lungs.    Marland Kitchen albuterol (PROVENTIL) (2.5 MG/3ML) 0.083% nebulizer solution Inhale into the lungs.    Marland Kitchen aspirin EC 81 MG tablet Take 81 mg by mouth.    . chlorpheniramine-HYDROcodone (TUSSIONEX) 10-8 MG/5ML SUER Take by mouth.    . clonazePAM (KLONOPIN) 0.5 MG tablet     . conjugated estrogens (PREMARIN) vaginal cream Place vaginally.    . DULoxetine (CYMBALTA) 20 MG  capsule Take 20 mg by mouth.    . EPINEPHrine (EPIPEN 2-PAK) 0.3 mg/0.3 mL IJ SOAJ injection as needed.     . ferrous sulfate 325 (65 FE) MG tablet Take by mouth.    . fluticasone (CUTIVATE) 0.05 % cream Apply topically 2 (two) times daily. 120 g 1  . Fluticasone-Salmeterol (ADVAIR DISKUS) 250-50 MCG/DOSE AEPB Inhale into the lungs.    . gabapentin (NEURONTIN) 300 MG capsule Take 600 mg by mouth 2 (two) times daily as needed (sometimes 3 times daily).    . magnesium oxide (MAG-OX) 400 MG tablet     . METHENAMINE HIPPURATE PO Take 1 mg by mouth daily.    Marland Kitchen MICARDIS 40 MG tablet     . montelukast (SINGULAIR) 10 MG tablet Take by  mouth.    Marland Kitchen MYRBETRIQ 50 MG TB24 tablet     . nitroGLYCERIN (NITROSTAT) 0.4 MG SL tablet Place 0.4 mg under the tongue.    . pantoprazole (PROTONIX) 40 MG tablet Take by mouth.    . polyethylene glycol (MIRALAX / GLYCOLAX) packet Take by mouth.    . potassium chloride (KLOR-CON) 20 MEQ packet Take by mouth.    . potassium chloride SA (K-DUR,KLOR-CON) 20 MEQ tablet Take by mouth.    . Teriparatide, Recombinant, 600 MCG/2.4ML SOLN Inject into the skin.    Marland Kitchen traMADol (ULTRAM) 50 MG tablet Take by mouth.    . trimethoprim (TRIMPEX) 100 MG tablet      No current facility-administered medications for this visit.     Allergies  Allergen Reactions  . Caffeine Shortness Of Breath  . Haloperidol Shortness Of Breath  . Food     Allergy to seafood  . Nicotine Other (See Comments)    asthma  . Tape     Said it as a disappearing tape but not sure    Review of Systems:  Constitutional: Denies fever, chills, diaphoresis, appetite change and fatigue.  HEENT: Denies photophobia, eye pain, redness, ear pain, congestion, sore throat, rhinorrhea, sneezing, mouth sores, neck pain, neck stiffness and tinnitus.  Has hearing problems. Respiratory: Denies SOB, DOE, cough, chest tightness,  and wheezing.   Cardiovascular: Denies chest pain, palpitations and leg swelling.  Genitourinary: Has frequency, no hematuria, flank pain and difficulty urinating.  Musculoskeletal: Has back pain, joint swelling, arthralgias and gait problem.  Skin: No rash.  Neurological: Denies dizziness, seizures, syncope, weakness, light-headedness, numbness and headaches.  Hematological: Denies adenopathy. Easy bruising, personal or family bleeding history  Psychiatric/Behavioral: Has anxiety or depression. Has sleeping problems.     Physical Exam:    BP 122/80   Pulse 80   Ht 5\' 3"  (1.6 m)   Wt 177 lb 4 oz (80.4 kg)   BMI 31.40 kg/m  Filed Weights   10/18/17 0948  Weight: 177 lb 4 oz (80.4 kg)   Constitutional:   Well-developed, in no acute distress. Psychiatric: Normal mood and affect. Behavior is normal. HEENT: Pupils normal.  Conjunctivae are normal. No scleral icterus. Neck supple.  Cardiovascular: Normal rate, regular rhythm. No edema Pulmonary/chest: Effort normal and breath sounds normal. No wheezing, rales or rhonchi. Abdominal: Soft, nondistended. Nontender. Bowel sounds active throughout. There are no masses palpable. No hepatomegaly.  Well-healed surgical scars. Rectal:  defered Neurological: Alert and oriented to person place and time. Skin: Skin is warm and dry. No rashes noted.   Carmell Austria, MD 10/18/2017, 10:05 AM  Cc: [name], NP

## 2017-10-20 DIAGNOSIS — R29890 Loss of height: Secondary | ICD-10-CM | POA: Diagnosis not present

## 2017-10-20 DIAGNOSIS — M818 Other osteoporosis without current pathological fracture: Secondary | ICD-10-CM | POA: Diagnosis not present

## 2017-10-20 DIAGNOSIS — T50905A Adverse effect of unspecified drugs, medicaments and biological substances, initial encounter: Secondary | ICD-10-CM | POA: Diagnosis not present

## 2017-10-20 DIAGNOSIS — Z8781 Personal history of (healed) traumatic fracture: Secondary | ICD-10-CM | POA: Diagnosis not present

## 2017-10-21 DIAGNOSIS — Z6832 Body mass index (BMI) 32.0-32.9, adult: Secondary | ICD-10-CM | POA: Diagnosis not present

## 2017-10-21 DIAGNOSIS — R102 Pelvic and perineal pain: Secondary | ICD-10-CM | POA: Diagnosis not present

## 2017-10-24 ENCOUNTER — Other Ambulatory Visit: Payer: Medicare Other

## 2017-10-24 DIAGNOSIS — R197 Diarrhea, unspecified: Secondary | ICD-10-CM | POA: Diagnosis not present

## 2017-10-27 DIAGNOSIS — R942 Abnormal results of pulmonary function studies: Secondary | ICD-10-CM | POA: Diagnosis not present

## 2017-10-27 DIAGNOSIS — K219 Gastro-esophageal reflux disease without esophagitis: Secondary | ICD-10-CM | POA: Diagnosis not present

## 2017-10-27 DIAGNOSIS — J4530 Mild persistent asthma, uncomplicated: Secondary | ICD-10-CM | POA: Diagnosis not present

## 2017-11-01 LAB — GASTROINTESTINAL PATHOGEN PANEL PCR
C. DIFFICILE TOX A/B, PCR: NOT DETECTED
CRYPTOSPORIDIUM, PCR: NOT DETECTED
Campylobacter, PCR: NOT DETECTED
E COLI (ETEC) LT/ST, PCR: NOT DETECTED
E COLI (STEC) STX1/STX2, PCR: NOT DETECTED
E coli 0157, PCR: NOT DETECTED
GIARDIA LAMBLIA, PCR: NOT DETECTED
NOROVIRUS, PCR: NOT DETECTED
ROTAVIRUS, PCR: NOT DETECTED
Salmonella, PCR: NOT DETECTED
Shigella, PCR: NOT DETECTED

## 2017-11-01 LAB — FECAL LACTOFERRIN, QUANT
Fecal Lactoferrin: NEGATIVE
MICRO NUMBER:: 91202198
SPECIMEN QUALITY:: ADEQUATE

## 2017-11-17 DIAGNOSIS — R1312 Dysphagia, oropharyngeal phase: Secondary | ICD-10-CM | POA: Diagnosis not present

## 2017-11-17 DIAGNOSIS — Z8781 Personal history of (healed) traumatic fracture: Secondary | ICD-10-CM | POA: Diagnosis not present

## 2017-11-17 DIAGNOSIS — T50905A Adverse effect of unspecified drugs, medicaments and biological substances, initial encounter: Secondary | ICD-10-CM | POA: Diagnosis not present

## 2017-11-17 DIAGNOSIS — M818 Other osteoporosis without current pathological fracture: Secondary | ICD-10-CM | POA: Diagnosis not present

## 2017-12-09 DIAGNOSIS — Z7982 Long term (current) use of aspirin: Secondary | ICD-10-CM | POA: Diagnosis not present

## 2017-12-09 DIAGNOSIS — E785 Hyperlipidemia, unspecified: Secondary | ICD-10-CM | POA: Diagnosis not present

## 2017-12-09 DIAGNOSIS — K317 Polyp of stomach and duodenum: Secondary | ICD-10-CM | POA: Diagnosis not present

## 2017-12-09 DIAGNOSIS — Z79899 Other long term (current) drug therapy: Secondary | ICD-10-CM | POA: Diagnosis not present

## 2017-12-09 DIAGNOSIS — M1990 Unspecified osteoarthritis, unspecified site: Secondary | ICD-10-CM | POA: Diagnosis not present

## 2017-12-09 DIAGNOSIS — J449 Chronic obstructive pulmonary disease, unspecified: Secondary | ICD-10-CM | POA: Diagnosis not present

## 2017-12-09 DIAGNOSIS — R1310 Dysphagia, unspecified: Secondary | ICD-10-CM | POA: Diagnosis not present

## 2017-12-09 DIAGNOSIS — R1314 Dysphagia, pharyngoesophageal phase: Secondary | ICD-10-CM | POA: Diagnosis not present

## 2017-12-09 DIAGNOSIS — K2950 Unspecified chronic gastritis without bleeding: Secondary | ICD-10-CM | POA: Diagnosis not present

## 2017-12-09 DIAGNOSIS — Z8673 Personal history of transient ischemic attack (TIA), and cerebral infarction without residual deficits: Secondary | ICD-10-CM | POA: Diagnosis not present

## 2017-12-09 DIAGNOSIS — K2970 Gastritis, unspecified, without bleeding: Secondary | ICD-10-CM | POA: Diagnosis not present

## 2017-12-09 DIAGNOSIS — R490 Dysphonia: Secondary | ICD-10-CM | POA: Diagnosis not present

## 2017-12-09 DIAGNOSIS — I10 Essential (primary) hypertension: Secondary | ICD-10-CM | POA: Diagnosis not present

## 2017-12-09 DIAGNOSIS — M810 Age-related osteoporosis without current pathological fracture: Secondary | ICD-10-CM | POA: Diagnosis not present

## 2017-12-09 DIAGNOSIS — K219 Gastro-esophageal reflux disease without esophagitis: Secondary | ICD-10-CM | POA: Diagnosis not present

## 2017-12-30 DIAGNOSIS — R8290 Unspecified abnormal findings in urine: Secondary | ICD-10-CM | POA: Diagnosis not present

## 2017-12-30 DIAGNOSIS — N390 Urinary tract infection, site not specified: Secondary | ICD-10-CM | POA: Diagnosis not present

## 2018-01-13 DIAGNOSIS — Z1231 Encounter for screening mammogram for malignant neoplasm of breast: Secondary | ICD-10-CM | POA: Diagnosis not present

## 2018-01-23 ENCOUNTER — Ambulatory Visit: Payer: Medicare Other | Admitting: Nurse Practitioner

## 2018-01-25 ENCOUNTER — Ambulatory Visit (INDEPENDENT_AMBULATORY_CARE_PROVIDER_SITE_OTHER): Payer: Medicare Other | Admitting: Nurse Practitioner

## 2018-01-25 ENCOUNTER — Encounter: Payer: Self-pay | Admitting: Nurse Practitioner

## 2018-01-25 ENCOUNTER — Encounter (INDEPENDENT_AMBULATORY_CARE_PROVIDER_SITE_OTHER): Payer: Self-pay

## 2018-01-25 VITALS — BP 130/70 | HR 98 | Ht 64.0 in | Wt 175.0 lb

## 2018-01-25 DIAGNOSIS — R197 Diarrhea, unspecified: Secondary | ICD-10-CM | POA: Diagnosis not present

## 2018-01-25 MED ORDER — NA SULFATE-K SULFATE-MG SULF 17.5-3.13-1.6 GM/177ML PO SOLN: ORAL | 0 refills | Status: DC

## 2018-01-25 NOTE — Progress Notes (Signed)
Chief Complaint:   persistent diarrhea  IMPRESSION and PLAN:    1. 78 yo female with 4 month history of diarrhea, mainly postprandial. Stool path panel / lactoferrin negative. Weight is stable. Doubt malabsorption. Infection ruled out. No obvious culprit meds.  -we discussed trial of imodium or other anti-diarrheal medication. She is miserable, doesn't like to leave home due to the diarrhea and incontinence. She wants further workup.  -Colonoscopy probably low yield, she had one ~ 4 years ago ( not for diarrhea) but we could obtain random biopsies to check for microscopic colitis. The risks and benefits of colonoscopy with possible polypectomy were discussed and the patient agrees to proceed.  -in the interim I recommended she start Benefiber, a soluble fiber which can help absorb extra fluid in the lumen.  As long as stools are loose will be harder for her to maintain continence.   2. History of Niesen fundoplication 1914.  She recently underwent EGD at [facility].  I do not have that report but procedure was apparently done for dysphagia. No current problems swallowing   HPI:     Patient is a 78 year old female known to Dr. Lyndel Safe with history of chronic constipation until this past September when she began having postprandial loose stools associated with urgency.  Patient saw Dr. Lyndel Safe Oct 1, her MiraLAX and magnesium supplement were held. We ordered stool pathogen panel and stool lactoferrin .    Patient returns with persistent diarrhea. Though stools are sometimes formed, she continues to have urgent, loose, non-bloody bowel movements postprandially. Any PO intake including water triggers the diarrhea. No nocturnal stooling.    No associated abdominal pain. Stool volume varies between small and moderate amount.   She hasn't been able to relate the diarrhea to any changes in medications or diet.  Holding magnesium for several weeks had no bearing on the diarrhea, patient restarted it for  cramps.  Patient uncomfortable leaving home due to the diarrhea and episodes of incontinence.  Diarrhea significantly impacting quality of life   Review of systems:     No chest pain, no SOB, no fevers, no urinary sx   Past Medical History:  Diagnosis Date  . Acid reflux   . Anxiety   . Arthritis   . Asthma   . Cataracts, bilateral   . COPD (chronic obstructive pulmonary disease) ([place])   . H/O bladder problems   . Hypertension   . IBS (irritable bowel syndrome)   . Osteoporosis   . Pneumonia     Patient's surgical history, family medical history, social history, medications and allergies were all reviewed in Epic   Creatinine clearance cannot be calculated (No successful lab value found.)  Current Outpatient Medications  Medication Sig Dispense Refill  . acetaminophen (TYLENOL) 325 MG tablet Take by mouth.    Marland Kitchen albuterol (PROVENTIL HFA;VENTOLIN HFA) 108 (90 Base) MCG/ACT inhaler Inhale into the lungs.    Marland Kitchen albuterol (PROVENTIL) (2.5 MG/3ML) 0.083% nebulizer solution Inhale into the lungs.    Marland Kitchen aspirin EC 81 MG tablet Take 81 mg by mouth.    . chlorpheniramine-HYDROcodone (TUSSIONEX) 10-8 MG/5ML SUER Take by mouth.    . clonazePAM (KLONOPIN) 0.5 MG tablet     . conjugated estrogens (PREMARIN) vaginal cream Place vaginally.    . DULoxetine (CYMBALTA) 20 MG capsule Take 20 mg by mouth.    . EPINEPHrine (EPIPEN 2-PAK) 0.3 mg/0.3 mL IJ SOAJ injection as needed.     . fluticasone (CUTIVATE) 0.05 %  cream Apply topically 2 (two) times daily. 120 g 1  . Fluticasone-Salmeterol (ADVAIR DISKUS) 250-50 MCG/DOSE AEPB Inhale into the lungs.    . gabapentin (NEURONTIN) 300 MG capsule Take 600 mg by mouth 2 (two) times daily as needed (sometimes 3 times daily).    . magnesium oxide (MAG-OX) 400 MG tablet     . MICARDIS 40 MG tablet     . montelukast (SINGULAIR) 10 MG tablet Take by mouth.    Marland Kitchen MYRBETRIQ 50 MG TB24 tablet     . nitroGLYCERIN (NITROSTAT) 0.4 MG SL tablet Place 0.4 mg under the  tongue.    . pantoprazole (PROTONIX) 40 MG tablet Take by mouth.    . polyethylene glycol (MIRALAX / GLYCOLAX) packet Take by mouth.    . potassium chloride SA (K-DUR,KLOR-CON) 20 MEQ tablet Take by mouth.    . traMADol (ULTRAM) 50 MG tablet Take by mouth.    . trimethoprim (TRIMPEX) 100 MG tablet      No current facility-administered medications for this visit.     Physical Exam:     BP 130/70   Pulse 98   Ht 5\' 4"  (1.626 m)   Wt 175 lb (79.4 kg)   SpO2 94%   BMI 30.04 kg/m   GENERAL:  Pleasant female in NAD. Husband present PSYCH: : Cooperative, normal affect EENT:  conjunctiva pink, mucous membranes moist, neck supple without masses CARDIAC:  RRR, no murmur heard, no peripheral edema PULM: Normal respiratory effort, lungs CTA bilaterally, no wheezing ABDOMEN:  Nondistended, soft, nontender. No obvious masses, no hepatomegaly,  normal bowel sounds SKIN:  turgor, no lesions seen Musculoskeletal:  Normal muscle tone, normal strength NEURO: Alert and oriented x 3, no focal neurologic deficits   Tye Savoy , NP 01/25/2018, 10:47 AM

## 2018-01-25 NOTE — Patient Instructions (Signed)
If you are age 78 or older, your body mass index should be between 23-30. Your Body mass index is 30.04 kg/m. If this is out of the aforementioned range listed, please consider follow up with your Primary Care Provider.  If you are age 25 or younger, your body mass index should be between 19-25. Your Body mass index is 30.04 kg/m. If this is out of the aformentioned range listed, please consider follow up with your Primary Care Provider.   You have been scheduled for a colonoscopy. Please follow written instructions given to you at your visit today.  Please pick up your prep supplies at the pharmacy within the next 1-3 days. If you use inhalers (even only as needed), please bring them with you on the day of your procedure. Your physician has requested that you go to www.startemmi.com and enter the access code given to you at your visit today. This web site gives a general overview about your procedure. However, you should still follow specific instructions given to you by our office regarding your preparation for the procedure.  We have sent the following medications to your pharmacy for you to pick up at your convenience: [employer] daily.  (over-the-counter)  Thank you for choosing me and [facility].   Tye Savoy, NP

## 2018-01-26 NOTE — Progress Notes (Signed)
Thanks Nevin Bloodgood for taking care of her Agree with the plan.

## 2018-01-30 ENCOUNTER — Ambulatory Visit (AMBULATORY_SURGERY_CENTER): Payer: Medicare Other | Admitting: Gastroenterology

## 2018-01-30 ENCOUNTER — Encounter: Payer: Self-pay | Admitting: Gastroenterology

## 2018-01-30 VITALS — BP 153/74 | HR 77 | Temp 97.8°F | Resp 15 | Ht 64.0 in | Wt 175.0 lb

## 2018-01-30 DIAGNOSIS — D128 Benign neoplasm of rectum: Secondary | ICD-10-CM

## 2018-01-30 DIAGNOSIS — D120 Benign neoplasm of cecum: Secondary | ICD-10-CM | POA: Diagnosis not present

## 2018-01-30 DIAGNOSIS — R197 Diarrhea, unspecified: Secondary | ICD-10-CM | POA: Diagnosis not present

## 2018-01-30 DIAGNOSIS — D129 Benign neoplasm of anus and anal canal: Secondary | ICD-10-CM

## 2018-01-30 DIAGNOSIS — K621 Rectal polyp: Secondary | ICD-10-CM | POA: Diagnosis not present

## 2018-01-30 DIAGNOSIS — K5730 Diverticulosis of large intestine without perforation or abscess without bleeding: Secondary | ICD-10-CM | POA: Diagnosis not present

## 2018-01-30 MED ORDER — SODIUM CHLORIDE 0.9 % IV SOLN: 500.0000 mL | Freq: Once | INTRAVENOUS | Status: DC

## 2018-01-30 NOTE — Progress Notes (Signed)
To PACU, VSS. Report to [email or web address] 

## 2018-01-30 NOTE — Progress Notes (Signed)
Called to room to assist during endoscopic procedure.  Patient ID and intended procedure confirmed with present staff. Received instructions for my participation in the procedure from the performing physician.  

## 2018-01-30 NOTE — Op Note (Addendum)
[place] Patient Name: Melinda Hartman Procedure Date: 01/30/2018 2:56 PM MRN: [id number] Endoscopist: [name] , MD Age: 78 Referring MD:  Date of Birth: 1940-02-28 Gender: Female Account #: [id number] Procedure:                Colonoscopy Indications:              Chronic diarrhea Medicines:                Monitored Anesthesia Care Procedure:                Pre-Anesthesia Assessment:                           - Prior to the procedure, a History and Physical                            was performed, and patient medications and                            allergies were reviewed. The patient's tolerance of                            previous anesthesia was also reviewed. The risks                            and benefits of the procedure and the sedation                            options and risks were discussed with the patient.                            All questions were answered, and informed consent                            was obtained. Prior Anticoagulants: The patient has                            taken no previous anticoagulant or antiplatelet                            agents. ASA Grade Assessment: II - A patient with                            mild systemic disease. After reviewing the risks                            and benefits, the patient was deemed in                            satisfactory condition to undergo the procedure.                           After obtaining informed consent, the colonoscope  was passed under direct vision. Throughout the                            procedure, the patient's blood pressure, pulse, and                            oxygen saturations were monitored continuously. The                            Colonoscope was introduced through the anus and                            advanced to the 2 cm into the ileum. The                            colonoscopy was performed without difficulty. The                         patient tolerated the procedure well. The quality                            of the bowel preparation was good. Scope In: 3:02:10 PM Scope Out: 3:20:45 PM Scope Withdrawal Time: 0 hours 13 minutes 34 seconds  Total Procedure Duration: 0 hours 18 minutes 35 seconds  Findings:                 A 4 mm polyp was found in the cecum. The polyp was                            sessile. The polyp was removed with a cold biopsy                            forceps. Resection and retrieval were complete.                            Estimated blood loss: none.                           A 6 mm polyp was found in the rectum. The polyp was                            sessile. The polyp was removed with a cold snare.                            Resection and retrieval were complete. Estimated                            blood loss: none.                           A few rare small-mouthed diverticula were found in                            the sigmoid colon.  Non-bleeding internal hemorrhoids were found during                            retroflexion. The hemorrhoids were small. Biopsies                            for histology were taken with a cold forceps for                            evaluation of microscopic colitis. Estimated blood                            loss: none.                           The terminal ileum appeared normal. Biopsies were                            taken with a cold forceps for histology.                           The exam was otherwise without abnormality. Complications:            No immediate complications. Estimated Blood Loss:     Estimated blood loss: none. Impression:               -Colonic polyps status post polypectomy.                           -Minimal sigmoid diverticulosis.                           -Otherwise normal colonoscopy to TI. Recommendation:           - Patient has a contact number available for                             emergencies. The signs and symptoms of potential                            delayed complications were discussed with the                            patient. Return to normal activities tomorrow.                            Written discharge instructions were provided to the                            patient.                           - Resume previous diet.                           - Imodium AD 1 tablet p.o. twice daily and as  needed.                           - Stop all magnesium supplements and                            over-the-counter supplements.                           - Await pathology results.                           - Repeat colonoscopy for surveillance based on                            pathology results.                           - Return to GI clinic in 4 weeks. [name], MD 01/30/2018 3:26:41 PM This report has been signed electronically.

## 2018-01-30 NOTE — Patient Instructions (Signed)
YOU HAD AN ENDOSCOPIC PROCEDURE TODAY AT THE [facility]:   Refer to the procedure report that was given to you for any specific questions about what was found during the examination.  If the procedure report does not answer your questions, please call your gastroenterologist to clarify.  If you requested that your care partner not be given the details of your procedure findings, then the procedure report has been included in a sealed envelope for you to review at your convenience later.  YOU SHOULD EXPECT: Some feelings of bloating in the abdomen. Passage of more gas than usual.  Walking can help get rid of the air that was put into your GI tract during the procedure and reduce the bloating. If you had a lower endoscopy (such as a colonoscopy or flexible sigmoidoscopy) you may notice spotting of blood in your stool or on the toilet paper. If you underwent a bowel prep for your procedure, you may not have a normal bowel movement for a few days.  Please Note:  You might notice some irritation and congestion in your nose or some drainage.  This is from the oxygen used during your procedure.  There is no need for concern and it should clear up in a day or so.  SYMPTOMS TO REPORT IMMEDIATELY:   Following lower endoscopy (colonoscopy or flexible sigmoidoscopy):  Excessive amounts of blood in the stool  Significant tenderness or worsening of abdominal pains  Swelling of the abdomen that is new, acute  Fever of 100F or higher  For urgent or emergent issues, a gastroenterologist can be reached at any hour by calling [phone].   DIET:  We do recommend a small meal at first, but then you may proceed to your regular diet.  Drink plenty of fluids but you should avoid alcoholic beverages for 24 hours.  ACTIVITY:  You should plan to take it easy for the rest of today and you should NOT DRIVE or use heavy machinery until tomorrow (because of the sedation medicines used during the test).     FOLLOW UP: Our staff will call the number listed on your records the next business day following your procedure to check on you and address any questions or concerns that you may have regarding the information given to you following your procedure. If we do not reach you, we will leave a message.  However, if you are feeling well and you are not experiencing any problems, there is no need to return our call.  We will assume that you have returned to your regular daily activities without incident.  If any biopsies were taken you will be contacted by phone or by letter within the next 1-3 weeks.  Please call us at [phone] if you have not heard about the biopsies in 3 weeks.    SIGNATURES/CONFIDENTIALITY: You and/or your care partner have signed paperwork which will be entered into your electronic medical record.  These signatures attest to the fact that that the information above on your After Visit Summary has been reviewed and is understood.  Full responsibility of the confidentiality of this discharge information lies with you and/or your care-partner. 

## 2018-01-31 ENCOUNTER — Telehealth: Payer: Self-pay

## 2018-01-31 NOTE — Telephone Encounter (Signed)
  Follow up Call-  Call back number 01/30/2018  Post procedure Call Back phone  # [phone]  Permission to leave phone message Yes  Some recent data might be hidden     Patient questions:  Do you have a fever, pain , or abdominal swelling? No. Pain Score  0 *  Have you tolerated food without any problems? Yes.    Have you been able to return to your normal activities? Yes.    Do you have any questions about your discharge instructions: Diet   No. Medications  No. Follow up visit  No.  Do you have questions or concerns about your Care? No.  Actions: * If pain score is 4 or above: No action needed, pain <4.

## 2018-01-31 NOTE — Telephone Encounter (Signed)
Attempted to reach patient for post-procedure f/u call. No answer. Left message that we will make another attempt to reach her again later today and for her to please not hesitate to call us if she has any questions/concerns regarding her care. 

## 2018-02-01 DIAGNOSIS — M545 Low back pain: Secondary | ICD-10-CM | POA: Diagnosis not present

## 2018-02-01 DIAGNOSIS — X509XXA Other and unspecified overexertion or strenuous movements or postures, initial encounter: Secondary | ICD-10-CM | POA: Diagnosis not present

## 2018-02-01 DIAGNOSIS — S39012A Strain of muscle, fascia and tendon of lower back, initial encounter: Secondary | ICD-10-CM | POA: Diagnosis not present

## 2018-02-01 DIAGNOSIS — Z8781 Personal history of (healed) traumatic fracture: Secondary | ICD-10-CM | POA: Diagnosis not present

## 2018-02-02 ENCOUNTER — Telehealth: Payer: Self-pay

## 2018-02-02 NOTE — Telephone Encounter (Signed)
Left message for patient to call back  

## 2018-02-06 NOTE — Telephone Encounter (Signed)
Follow up appt made and is in Epic;

## 2018-02-07 DIAGNOSIS — M1990 Unspecified osteoarthritis, unspecified site: Secondary | ICD-10-CM | POA: Diagnosis not present

## 2018-02-07 DIAGNOSIS — R739 Hyperglycemia, unspecified: Secondary | ICD-10-CM | POA: Diagnosis not present

## 2018-02-07 DIAGNOSIS — D539 Nutritional anemia, unspecified: Secondary | ICD-10-CM | POA: Diagnosis not present

## 2018-02-07 DIAGNOSIS — I10 Essential (primary) hypertension: Secondary | ICD-10-CM | POA: Diagnosis not present

## 2018-02-07 DIAGNOSIS — R748 Abnormal levels of other serum enzymes: Secondary | ICD-10-CM | POA: Diagnosis not present

## 2018-02-07 DIAGNOSIS — E785 Hyperlipidemia, unspecified: Secondary | ICD-10-CM | POA: Diagnosis not present

## 2018-02-07 DIAGNOSIS — E559 Vitamin D deficiency, unspecified: Secondary | ICD-10-CM | POA: Diagnosis not present

## 2018-02-09 ENCOUNTER — Encounter: Payer: Self-pay | Admitting: Gastroenterology

## 2018-02-17 DIAGNOSIS — R748 Abnormal levels of other serum enzymes: Secondary | ICD-10-CM | POA: Diagnosis not present

## 2018-03-03 DIAGNOSIS — M4185 Other forms of scoliosis, thoracolumbar region: Secondary | ICD-10-CM | POA: Diagnosis not present

## 2018-03-03 DIAGNOSIS — G8929 Other chronic pain: Secondary | ICD-10-CM | POA: Diagnosis not present

## 2018-03-03 DIAGNOSIS — R918 Other nonspecific abnormal finding of lung field: Secondary | ICD-10-CM | POA: Diagnosis not present

## 2018-03-03 DIAGNOSIS — Z981 Arthrodesis status: Secondary | ICD-10-CM | POA: Diagnosis not present

## 2018-03-03 DIAGNOSIS — M8580 Other specified disorders of bone density and structure, unspecified site: Secondary | ICD-10-CM | POA: Diagnosis not present

## 2018-03-03 DIAGNOSIS — M542 Cervicalgia: Secondary | ICD-10-CM | POA: Diagnosis not present

## 2018-03-03 DIAGNOSIS — M439 Deforming dorsopathy, unspecified: Secondary | ICD-10-CM | POA: Diagnosis not present

## 2018-03-03 DIAGNOSIS — M4712 Other spondylosis with myelopathy, cervical region: Secondary | ICD-10-CM | POA: Diagnosis not present

## 2018-03-03 DIAGNOSIS — Z8781 Personal history of (healed) traumatic fracture: Secondary | ICD-10-CM | POA: Diagnosis not present

## 2018-03-03 DIAGNOSIS — M4802 Spinal stenosis, cervical region: Secondary | ICD-10-CM | POA: Diagnosis not present

## 2018-03-03 DIAGNOSIS — M25561 Pain in right knee: Secondary | ICD-10-CM | POA: Diagnosis not present

## 2018-03-03 DIAGNOSIS — M50322 Other cervical disc degeneration at C5-C6 level: Secondary | ICD-10-CM | POA: Diagnosis not present

## 2018-03-06 ENCOUNTER — Ambulatory Visit (INDEPENDENT_AMBULATORY_CARE_PROVIDER_SITE_OTHER): Payer: Medicare Other | Admitting: Gastroenterology

## 2018-03-06 ENCOUNTER — Encounter: Payer: Self-pay | Admitting: Gastroenterology

## 2018-03-06 VITALS — BP 140/86 | HR 77 | Ht 63.5 in | Wt 178.2 lb

## 2018-03-06 DIAGNOSIS — R197 Diarrhea, unspecified: Secondary | ICD-10-CM | POA: Diagnosis not present

## 2018-03-06 NOTE — Patient Instructions (Signed)
If you are age 78 or older, your body mass index should be between 23-30. Your Body mass index is 31.08 kg/m. If this is out of the aforementioned range listed, please consider follow up with your Primary Care Provider.  If you are age 104 or younger, your body mass index should be between 19-25. Your Body mass index is 31.08 kg/m. If this is out of the aformentioned range listed, please consider follow up with your Primary Care Provider.   Thank you,  Dr. [name]

## 2018-03-06 NOTE — Progress Notes (Signed)
Chief Complaint: FU  Referring Provider:  [name], NP      ASSESSMENT AND PLAN;   #1.  Iatrogenic diarrhea (resolved after stopping magnesium, could have element of functional diarrhea/IBS as well). Neg colon 01/2018 except for small tubular adenomas, negative random colonic biopsies for microscopic colitis.  Normal TI.  Plan: - No need for FU colon d/t age.  D/w patient and patient's husband. - She is to call us if she starts having any problems. - FU in case of any GI problems.   HPI:    Melinda Hartman is a 78 y.o. female  Follow-up visit Much better No diarrhea now since stopped magnesium Seen by Spinal surgery - scheduled MRI neck 03/2010 Pleased with the progress   Past Medical History:  Diagnosis Date  . Acid reflux   . Anxiety   . Arthritis   . Asthma   . Cataracts, bilateral   . COPD (chronic obstructive pulmonary disease) ([place])   . H/O bladder problems   . Hypertension   . IBS (irritable bowel syndrome)   . Osteoporosis   . Pneumonia     Past Surgical History:  Procedure Laterality Date  . ABDOMINAL HYSTERECTOMY  1977  . APPENDECTOMY    . BACK SURGERY     x4 22 screws 2 rods  . COLONOSCOPY  04/11/2014   Mild diverticulosis. Otherwise normal colonoscopy. Highly redundant colon.   Marland Kitchen GALLBLADDER SURGERY  02/26/2017  . HERNIA REPAIR  02/26/2017  . KNEE ARTHROSCOPY Left     Family History  Problem Relation Age of Onset  . Stomach cancer Mother   . Colon cancer Neg Hx   . Esophageal cancer Neg Hx     Social History   Tobacco Use  . Smoking status: Never Smoker  . Smokeless tobacco: Never Used  Substance Use Topics  . Alcohol use: No  . Drug use: No    Current Outpatient Medications  Medication Sig Dispense Refill  . acetaminophen (TYLENOL) 325 MG tablet Take by mouth.    Marland Kitchen albuterol (PROVENTIL HFA;VENTOLIN HFA) 108 (90 Base) MCG/ACT inhaler Inhale into the lungs.    Marland Kitchen albuterol (PROVENTIL) (2.5 MG/3ML) 0.083% nebulizer solution  Inhale into the lungs.    Marland Kitchen aspirin EC 81 MG tablet Take 81 mg by mouth.    . chlorpheniramine-HYDROcodone (TUSSIONEX) 10-8 MG/5ML SUER Take by mouth.    . clonazePAM (KLONOPIN) 0.5 MG tablet     . conjugated estrogens (PREMARIN) vaginal cream Place vaginally.    . DULoxetine (CYMBALTA) 20 MG capsule Take 20 mg by mouth.    . EPINEPHrine (EPIPEN 2-PAK) 0.3 mg/0.3 mL IJ SOAJ injection as needed.     . fluticasone (CUTIVATE) 0.05 % cream Apply topically 2 (two) times daily. 120 g 1  . Fluticasone-Salmeterol (ADVAIR DISKUS) 250-50 MCG/DOSE AEPB Inhale into the lungs.    . gabapentin (NEURONTIN) 300 MG capsule Take 600 mg by mouth 2 (two) times daily as needed (sometimes 3 times daily).    . magnesium oxide (MAG-OX) 400 MG tablet     . MICARDIS 40 MG tablet     . montelukast (SINGULAIR) 10 MG tablet Take by mouth.    . nitroGLYCERIN (NITROSTAT) 0.4 MG SL tablet Place 0.4 mg under the tongue.    . pantoprazole (PROTONIX) 40 MG tablet Take by mouth.    . polyethylene glycol (MIRALAX / GLYCOLAX) packet Take by mouth.    . potassium chloride SA (K-DUR,KLOR-CON) 20 MEQ tablet Take by mouth.    Marland Kitchen  traMADol (ULTRAM) 50 MG tablet Take by mouth.    . trimethoprim (TRIMPEX) 100 MG tablet      No current facility-administered medications for this visit.     Allergies  Allergen Reactions  . Caffeine Shortness Of Breath  . Haloperidol Shortness Of Breath  . Food     Allergy to seafood  . Nicotine Other (See Comments)    asthma  . Tape     Said it as a disappearing tape but not sure    Review of Systems:  Neg except for significant arthritis, tingling sensation in arms, hands     Physical Exam:    BP 140/86   Pulse 77   Ht 5' 3.5" (1.613 m)   Wt 178 lb 4 oz (80.9 kg)   BMI 31.08 kg/m  Filed Weights   03/06/18 1346  Weight: 178 lb 4 oz (80.9 kg)   Constitutional:  Well-developed, in no acute distress. Psychiatric: Normal mood and affect. Behavior is normal. HEENT: Pupils normal.   Conjunctivae are normal. No scleral icterus. Cardiovascular: Normal rate, regular rhythm. No edema Pulmonary/chest: Effort normal and breath sounds normal. No wheezing, rales or rhonchi. Abdominal: Soft, nondistended. Nontender. Bowel sounds active throughout. There are no masses palpable. No hepatomegaly. Neurological: Alert and oriented to person place and time. Skin: Skin is warm and dry. No rashes noted.    Carmell Austria, MD 03/06/2018, 2:14 PM  Cc: [name], NP

## 2018-03-24 DIAGNOSIS — H16223 Keratoconjunctivitis sicca, not specified as Sjogren's, bilateral: Secondary | ICD-10-CM | POA: Diagnosis not present

## 2018-03-24 DIAGNOSIS — H2703 Aphakia, bilateral: Secondary | ICD-10-CM | POA: Diagnosis not present

## 2018-03-28 DIAGNOSIS — Z79899 Other long term (current) drug therapy: Secondary | ICD-10-CM | POA: Diagnosis not present

## 2018-03-28 DIAGNOSIS — M174 Other bilateral secondary osteoarthritis of knee: Secondary | ICD-10-CM | POA: Diagnosis not present

## 2018-03-28 DIAGNOSIS — M1712 Unilateral primary osteoarthritis, left knee: Secondary | ICD-10-CM | POA: Diagnosis not present

## 2018-03-30 DIAGNOSIS — M4712 Other spondylosis with myelopathy, cervical region: Secondary | ICD-10-CM | POA: Diagnosis not present

## 2018-03-30 DIAGNOSIS — M47812 Spondylosis without myelopathy or radiculopathy, cervical region: Secondary | ICD-10-CM | POA: Diagnosis not present

## 2018-03-30 DIAGNOSIS — Z981 Arthrodesis status: Secondary | ICD-10-CM | POA: Diagnosis not present

## 2018-03-30 DIAGNOSIS — Z8781 Personal history of (healed) traumatic fracture: Secondary | ICD-10-CM | POA: Diagnosis not present

## 2018-03-30 DIAGNOSIS — M4802 Spinal stenosis, cervical region: Secondary | ICD-10-CM | POA: Diagnosis not present

## 2018-03-30 DIAGNOSIS — M439 Deforming dorsopathy, unspecified: Secondary | ICD-10-CM | POA: Diagnosis not present

## 2018-03-30 DIAGNOSIS — M539 Dorsopathy, unspecified: Secondary | ICD-10-CM | POA: Diagnosis not present

## 2018-03-30 DIAGNOSIS — M4714 Other spondylosis with myelopathy, thoracic region: Secondary | ICD-10-CM | POA: Diagnosis not present

## 2018-04-04 DIAGNOSIS — M25461 Effusion, right knee: Secondary | ICD-10-CM | POA: Diagnosis not present

## 2018-04-04 DIAGNOSIS — M150 Primary generalized (osteo)arthritis: Secondary | ICD-10-CM | POA: Diagnosis not present

## 2018-04-04 DIAGNOSIS — M810 Age-related osteoporosis without current pathological fracture: Secondary | ICD-10-CM | POA: Diagnosis not present

## 2018-04-07 DIAGNOSIS — M7989 Other specified soft tissue disorders: Secondary | ICD-10-CM | POA: Diagnosis not present

## 2018-04-07 DIAGNOSIS — M25461 Effusion, right knee: Secondary | ICD-10-CM | POA: Diagnosis not present

## 2018-04-21 DIAGNOSIS — M810 Age-related osteoporosis without current pathological fracture: Secondary | ICD-10-CM | POA: Diagnosis not present

## 2018-05-23 DIAGNOSIS — Z0000 Encounter for general adult medical examination without abnormal findings: Secondary | ICD-10-CM | POA: Diagnosis not present

## 2018-05-23 DIAGNOSIS — Z9181 History of falling: Secondary | ICD-10-CM | POA: Diagnosis not present

## 2018-05-23 DIAGNOSIS — M1990 Unspecified osteoarthritis, unspecified site: Secondary | ICD-10-CM | POA: Diagnosis not present

## 2018-05-23 DIAGNOSIS — Z1331 Encounter for screening for depression: Secondary | ICD-10-CM | POA: Diagnosis not present

## 2018-05-23 DIAGNOSIS — E785 Hyperlipidemia, unspecified: Secondary | ICD-10-CM | POA: Diagnosis not present

## 2018-05-23 DIAGNOSIS — Z6833 Body mass index (BMI) 33.0-33.9, adult: Secondary | ICD-10-CM | POA: Diagnosis not present

## 2018-05-23 DIAGNOSIS — D539 Nutritional anemia, unspecified: Secondary | ICD-10-CM | POA: Diagnosis not present

## 2018-05-23 DIAGNOSIS — I10 Essential (primary) hypertension: Secondary | ICD-10-CM | POA: Diagnosis not present

## 2018-05-23 DIAGNOSIS — E559 Vitamin D deficiency, unspecified: Secondary | ICD-10-CM | POA: Diagnosis not present

## 2018-05-23 DIAGNOSIS — E669 Obesity, unspecified: Secondary | ICD-10-CM | POA: Diagnosis not present

## 2018-05-23 DIAGNOSIS — R739 Hyperglycemia, unspecified: Secondary | ICD-10-CM | POA: Diagnosis not present

## 2018-05-23 DIAGNOSIS — Z136 Encounter for screening for cardiovascular disorders: Secondary | ICD-10-CM | POA: Diagnosis not present

## 2018-06-02 DIAGNOSIS — Z7982 Long term (current) use of aspirin: Secondary | ICD-10-CM | POA: Diagnosis not present

## 2018-06-02 DIAGNOSIS — Z8673 Personal history of transient ischemic attack (TIA), and cerebral infarction without residual deficits: Secondary | ICD-10-CM | POA: Diagnosis not present

## 2018-06-02 DIAGNOSIS — I10 Essential (primary) hypertension: Secondary | ICD-10-CM | POA: Diagnosis not present

## 2018-06-02 DIAGNOSIS — S91301A Unspecified open wound, right foot, initial encounter: Secondary | ICD-10-CM | POA: Diagnosis not present

## 2018-06-02 DIAGNOSIS — Z79899 Other long term (current) drug therapy: Secondary | ICD-10-CM | POA: Diagnosis not present

## 2018-06-02 DIAGNOSIS — S91311A Laceration without foreign body, right foot, initial encounter: Secondary | ICD-10-CM | POA: Diagnosis not present

## 2018-06-08 DIAGNOSIS — S99921A Unspecified injury of right foot, initial encounter: Secondary | ICD-10-CM | POA: Diagnosis not present

## 2018-06-19 DIAGNOSIS — S99921A Unspecified injury of right foot, initial encounter: Secondary | ICD-10-CM | POA: Diagnosis not present

## 2018-06-21 DIAGNOSIS — M549 Dorsalgia, unspecified: Secondary | ICD-10-CM | POA: Diagnosis not present

## 2018-06-21 DIAGNOSIS — Z981 Arthrodesis status: Secondary | ICD-10-CM | POA: Diagnosis not present

## 2018-06-21 DIAGNOSIS — M4712 Other spondylosis with myelopathy, cervical region: Secondary | ICD-10-CM | POA: Diagnosis not present

## 2018-06-21 DIAGNOSIS — M47819 Spondylosis without myelopathy or radiculopathy, site unspecified: Secondary | ICD-10-CM | POA: Diagnosis not present

## 2018-06-21 DIAGNOSIS — M4714 Other spondylosis with myelopathy, thoracic region: Secondary | ICD-10-CM | POA: Diagnosis not present

## 2018-06-21 DIAGNOSIS — Z9049 Acquired absence of other specified parts of digestive tract: Secondary | ICD-10-CM | POA: Diagnosis not present

## 2018-06-21 DIAGNOSIS — M439 Deforming dorsopathy, unspecified: Secondary | ICD-10-CM | POA: Diagnosis not present

## 2018-06-21 DIAGNOSIS — M1711 Unilateral primary osteoarthritis, right knee: Secondary | ICD-10-CM | POA: Diagnosis not present

## 2018-06-21 DIAGNOSIS — R2681 Unsteadiness on feet: Secondary | ICD-10-CM | POA: Diagnosis not present

## 2018-06-21 DIAGNOSIS — M8588 Other specified disorders of bone density and structure, other site: Secondary | ICD-10-CM | POA: Diagnosis not present

## 2018-06-21 DIAGNOSIS — M7121 Synovial cyst of popliteal space [Baker], right knee: Secondary | ICD-10-CM | POA: Diagnosis not present

## 2018-06-26 DIAGNOSIS — R262 Difficulty in walking, not elsewhere classified: Secondary | ICD-10-CM | POA: Diagnosis not present

## 2018-06-26 DIAGNOSIS — M25562 Pain in left knee: Secondary | ICD-10-CM | POA: Diagnosis not present

## 2018-06-26 DIAGNOSIS — G8929 Other chronic pain: Secondary | ICD-10-CM | POA: Diagnosis not present

## 2018-06-26 DIAGNOSIS — M25561 Pain in right knee: Secondary | ICD-10-CM | POA: Diagnosis not present

## 2018-07-06 DIAGNOSIS — H61892 Other specified disorders of left external ear: Secondary | ICD-10-CM | POA: Diagnosis not present

## 2018-07-06 DIAGNOSIS — M1990 Unspecified osteoarthritis, unspecified site: Secondary | ICD-10-CM | POA: Diagnosis not present

## 2018-07-06 DIAGNOSIS — Z6833 Body mass index (BMI) 33.0-33.9, adult: Secondary | ICD-10-CM | POA: Diagnosis not present

## 2018-07-18 DIAGNOSIS — R42 Dizziness and giddiness: Secondary | ICD-10-CM | POA: Diagnosis not present

## 2018-07-18 DIAGNOSIS — R51 Headache: Secondary | ICD-10-CM | POA: Diagnosis not present

## 2018-07-18 DIAGNOSIS — I10 Essential (primary) hypertension: Secondary | ICD-10-CM | POA: Diagnosis not present

## 2018-09-01 DIAGNOSIS — E785 Hyperlipidemia, unspecified: Secondary | ICD-10-CM | POA: Diagnosis not present

## 2018-09-01 DIAGNOSIS — R739 Hyperglycemia, unspecified: Secondary | ICD-10-CM | POA: Diagnosis not present

## 2018-09-01 DIAGNOSIS — D539 Nutritional anemia, unspecified: Secondary | ICD-10-CM | POA: Diagnosis not present

## 2018-09-01 DIAGNOSIS — I10 Essential (primary) hypertension: Secondary | ICD-10-CM | POA: Diagnosis not present

## 2018-09-01 DIAGNOSIS — Z139 Encounter for screening, unspecified: Secondary | ICD-10-CM | POA: Diagnosis not present

## 2018-10-05 DIAGNOSIS — R300 Dysuria: Secondary | ICD-10-CM | POA: Diagnosis not present

## 2018-10-05 DIAGNOSIS — N390 Urinary tract infection, site not specified: Secondary | ICD-10-CM | POA: Diagnosis not present

## 2018-10-05 DIAGNOSIS — R159 Full incontinence of feces: Secondary | ICD-10-CM | POA: Diagnosis not present

## 2018-10-05 DIAGNOSIS — Z6833 Body mass index (BMI) 33.0-33.9, adult: Secondary | ICD-10-CM | POA: Diagnosis not present

## 2018-10-10 ENCOUNTER — Ambulatory Visit (INDEPENDENT_AMBULATORY_CARE_PROVIDER_SITE_OTHER): Payer: Medicare Other | Admitting: Gastroenterology

## 2018-10-10 ENCOUNTER — Encounter: Payer: Self-pay | Admitting: Gastroenterology

## 2018-10-10 ENCOUNTER — Other Ambulatory Visit: Payer: Self-pay

## 2018-10-10 VITALS — BP 140/92 | HR 72 | Temp 97.8°F | Ht 64.0 in | Wt 182.0 lb

## 2018-10-10 DIAGNOSIS — R197 Diarrhea, unspecified: Secondary | ICD-10-CM

## 2018-10-10 MED ORDER — CHOLESTYRAMINE 4 G PO PACK: PACK | ORAL | 11 refills | Status: DC

## 2018-10-10 NOTE — Patient Instructions (Signed)
If you are age 78 or older, your body mass index should be between 23-30. Your Body mass index is 31.24 kg/m. If this is out of the aforementioned range listed, please consider follow up with your Primary Care Provider.  If you are age 58 or younger, your body mass index should be between 19-25. Your Body mass index is 31.24 kg/m. If this is out of the aformentioned range listed, please consider follow up with your Primary Care Provider.   Please go to the lab at [facility] ([place] You will need to go to level "B", you do not need an appointment for this. Hours available are 7:30 am - 4:30 pm.   We have sent the following medications to your pharmacy for you to pick up at your convenience: Cholestyramine   Please call Dr. Leland Her nurse Karl Pock, RN)  in 2 weeks at [phone]  to let her now how you are doing.    Thank you,  Dr. [name]

## 2018-10-10 NOTE — Progress Notes (Signed)
Chief Complaint: FU  Referring Provider:  [name], NP      ASSESSMENT AND PLAN;   #1.  Recurrent diarrhea (resolved after stopping magnesium, could have element of functional diarrhea/IBS/bile acid diarrhea as well). Neg colon 01/2018 except for small tubular adenomas, neg random Bx for microscopic colitis.  Nl TI.  Plan: - Stool for GI pathogens and WBCs (R/O C. difficile since she is taking Keflex in the past) - Cholestyamine 4g po QD 2hr before or after #30, 6 refills  - No need for FU colon d/t age.  D/w patient and patient's husband. - She is to call us if she starts having any problems. - FU in case of any GI problems. - Call in 2 weeks. - If still with prbls, blood tests, trial of bentyl and CT.   HPI:    Melinda Hartman is a 78 y.o. female  Follow-up visit Accompanied by her husband (who is a very good cook) Started having diarrhea over the last 2 to 3 months.  Has not been taking magnesium.  Did take a short course of Keflex for UTI.  No nocturnal symptoms no weight loss.  No melena or hematochezia.  Does have lower abdominal crampy pain at times.  Afraid to go out.  Does happen more after eating.  Denies having any nausea, vomiting, heartburn, odynophagia or dysphagia.   Past Medical History:  Diagnosis Date  . Acid reflux   . Anxiety   . Arthritis   . Asthma   . Cataracts, bilateral   . COPD (chronic obstructive pulmonary disease) ([place])   . H/O bladder problems   . Hypertension   . IBS (irritable bowel syndrome)   . Osteoporosis   . Pneumonia     Past Surgical History:  Procedure Laterality Date  . ABDOMINAL HYSTERECTOMY  1977  . APPENDECTOMY    . BACK SURGERY     x4 22 screws 2 rods  . COLONOSCOPY  04/11/2014   Mild diverticulosis. Otherwise normal colonoscopy. Highly redundant colon.   Marland Kitchen GALLBLADDER SURGERY  02/26/2017  . HERNIA REPAIR  02/26/2017  . KNEE ARTHROSCOPY Left     Family History  Problem Relation Age of Onset  . Stomach  cancer Mother   . Colon cancer Neg Hx   . Esophageal cancer Neg Hx     Social History   Tobacco Use  . Smoking status: Never Smoker  . Smokeless tobacco: Never Used  Substance Use Topics  . Alcohol use: No  . Drug use: No    Current Outpatient Medications  Medication Sig Dispense Refill  . acetaminophen (TYLENOL) 325 MG tablet Take by mouth.    Marland Kitchen albuterol (PROVENTIL HFA;VENTOLIN HFA) 108 (90 Base) MCG/ACT inhaler Inhale into the lungs.    Marland Kitchen albuterol (PROVENTIL) (2.5 MG/3ML) 0.083% nebulizer solution Inhale into the lungs.    Marland Kitchen aspirin EC 81 MG tablet Take 81 mg by mouth.    . celecoxib (CELEBREX) 100 MG capsule Take 100 mg by mouth 2 (two) times daily.    . chlorpheniramine-HYDROcodone (TUSSIONEX) 10-8 MG/5ML SUER Take by mouth.    . Cholecalciferol (VITAMIN D3) 1.25 MG (50000 UT) CAPS Take 1 capsule by mouth once a week.    . clonazePAM (KLONOPIN) 0.5 MG tablet     . conjugated estrogens (PREMARIN) vaginal cream Place vaginally.    . DULoxetine (CYMBALTA) 20 MG capsule Take 20 mg by mouth.    . EPINEPHrine (EPIPEN 2-PAK) 0.3 mg/0.3 mL IJ SOAJ injection  as needed.     . ezetimibe (ZETIA) 10 MG tablet Take 10 mg by mouth daily.    Marland Kitchen FIBER PO Take by mouth.    . fluticasone (CUTIVATE) 0.05 % cream Apply topically 2 (two) times daily. 120 g 1  . Fluticasone-Salmeterol (ADVAIR DISKUS) 250-50 MCG/DOSE AEPB Inhale into the lungs.    . gabapentin (NEURONTIN) 300 MG capsule Take 600 mg by mouth 2 (two) times daily as needed (sometimes 3 times daily).    Marland Kitchen MICARDIS 40 MG tablet     . montelukast (SINGULAIR) 10 MG tablet Take by mouth.    . nitroGLYCERIN (NITROSTAT) 0.4 MG SL tablet Place 0.4 mg under the tongue.    . pantoprazole (PROTONIX) 40 MG tablet Take by mouth.    . polyethylene glycol (MIRALAX / GLYCOLAX) packet Take 17 g by mouth daily as needed.     . potassium chloride SA (K-DUR,KLOR-CON) 20 MEQ tablet Take by mouth.    . traMADol (ULTRAM) 50 MG tablet Take by mouth.      No current facility-administered medications for this visit.     Allergies  Allergen Reactions  . Caffeine Shortness Of Breath  . Haloperidol Shortness Of Breath  . Food     Allergy to seafood  . Nicotine Other (See Comments)    asthma  . Tape     Said it as a disappearing tape but not sure    Review of Systems:  Neg except for significant arthritis, tingling sensation in arms, hands     Physical Exam:    BP (!) 140/92   Pulse 72   Temp 97.8 F (36.6 C)   Ht 5\' 4"  (1.626 m)   Wt 182 lb (82.6 kg)   SpO2 97%   BMI 31.24 kg/m  Filed Weights   10/10/18 1610  Weight: 182 lb (82.6 kg)   Constitutional:  Well-developed, in no acute distress. Psychiatric: Normal mood and affect. Behavior is normal. HEENT: Pupils normal.  Conjunctivae are normal. No scleral icterus. Cardiovascular: Normal rate, regular rhythm. No edema Pulmonary/chest: Effort normal and breath sounds normal. No wheezing, rales or rhonchi. Abdominal: Soft, nondistended. Nontender. Bowel sounds active throughout. There are no masses palpable. No hepatomegaly. Neurological: Alert and oriented to person place and time. Skin: Skin is warm and dry. No rashes noted.    Carmell Austria, MD 10/10/2018, 4:28 PM  Cc: [name], NP

## 2018-10-11 ENCOUNTER — Other Ambulatory Visit: Payer: Medicare Other

## 2018-10-13 ENCOUNTER — Other Ambulatory Visit: Payer: Medicare Other

## 2018-10-13 DIAGNOSIS — R197 Diarrhea, unspecified: Secondary | ICD-10-CM

## 2018-10-17 DIAGNOSIS — Z6833 Body mass index (BMI) 33.0-33.9, adult: Secondary | ICD-10-CM | POA: Diagnosis not present

## 2018-10-17 DIAGNOSIS — R300 Dysuria: Secondary | ICD-10-CM | POA: Diagnosis not present

## 2018-10-17 DIAGNOSIS — N760 Acute vaginitis: Secondary | ICD-10-CM | POA: Diagnosis not present

## 2018-10-18 LAB — GASTROINTESTINAL PATHOGEN PANEL PCR
C. difficile Tox A/B, PCR: NOT DETECTED
Campylobacter, PCR: NOT DETECTED
Cryptosporidium, PCR: NOT DETECTED
E coli (ETEC) LT/ST PCR: NOT DETECTED
E coli (STEC) stx1/stx2, PCR: NOT DETECTED
E coli 0157, PCR: NOT DETECTED
Giardia lamblia, PCR: NOT DETECTED
Norovirus, PCR: NOT DETECTED
Rotavirus A, PCR: NOT DETECTED
Salmonella, PCR: NOT DETECTED
Shigella, PCR: NOT DETECTED

## 2018-10-18 LAB — FECAL LACTOFERRIN, QUANT
Fecal Lactoferrin: POSITIVE — AB
MICRO NUMBER:: 923170
SPECIMEN QUALITY:: ADEQUATE

## 2018-10-19 DIAGNOSIS — M810 Age-related osteoporosis without current pathological fracture: Secondary | ICD-10-CM | POA: Diagnosis not present

## 2018-10-26 DIAGNOSIS — J4540 Moderate persistent asthma, uncomplicated: Secondary | ICD-10-CM | POA: Diagnosis not present

## 2018-10-26 DIAGNOSIS — R942 Abnormal results of pulmonary function studies: Secondary | ICD-10-CM | POA: Diagnosis not present

## 2018-10-26 DIAGNOSIS — Z23 Encounter for immunization: Secondary | ICD-10-CM | POA: Diagnosis not present

## 2018-11-13 DIAGNOSIS — M810 Age-related osteoporosis without current pathological fracture: Secondary | ICD-10-CM | POA: Diagnosis not present

## 2018-11-13 DIAGNOSIS — M85851 Other specified disorders of bone density and structure, right thigh: Secondary | ICD-10-CM | POA: Diagnosis not present

## 2018-11-14 DIAGNOSIS — Z20828 Contact with and (suspected) exposure to other viral communicable diseases: Secondary | ICD-10-CM | POA: Diagnosis not present

## 2018-11-14 DIAGNOSIS — R519 Headache, unspecified: Secondary | ICD-10-CM | POA: Diagnosis not present

## 2018-11-20 DIAGNOSIS — N390 Urinary tract infection, site not specified: Secondary | ICD-10-CM | POA: Diagnosis not present

## 2018-11-20 DIAGNOSIS — N3281 Overactive bladder: Secondary | ICD-10-CM | POA: Diagnosis not present

## 2018-11-20 DIAGNOSIS — M810 Age-related osteoporosis without current pathological fracture: Secondary | ICD-10-CM | POA: Diagnosis not present

## 2018-11-27 DIAGNOSIS — M810 Age-related osteoporosis without current pathological fracture: Secondary | ICD-10-CM | POA: Diagnosis not present

## 2018-11-27 DIAGNOSIS — N390 Urinary tract infection, site not specified: Secondary | ICD-10-CM | POA: Diagnosis not present

## 2018-11-27 DIAGNOSIS — Z6834 Body mass index (BMI) 34.0-34.9, adult: Secondary | ICD-10-CM | POA: Diagnosis not present

## 2018-11-27 DIAGNOSIS — E669 Obesity, unspecified: Secondary | ICD-10-CM | POA: Diagnosis not present

## 2018-12-07 DIAGNOSIS — R739 Hyperglycemia, unspecified: Secondary | ICD-10-CM | POA: Diagnosis not present

## 2018-12-07 DIAGNOSIS — D539 Nutritional anemia, unspecified: Secondary | ICD-10-CM | POA: Diagnosis not present

## 2018-12-07 DIAGNOSIS — E785 Hyperlipidemia, unspecified: Secondary | ICD-10-CM | POA: Diagnosis not present

## 2018-12-07 DIAGNOSIS — I10 Essential (primary) hypertension: Secondary | ICD-10-CM | POA: Diagnosis not present

## 2018-12-08 DIAGNOSIS — M810 Age-related osteoporosis without current pathological fracture: Secondary | ICD-10-CM | POA: Diagnosis not present

## 2018-12-22 DIAGNOSIS — Z20828 Contact with and (suspected) exposure to other viral communicable diseases: Secondary | ICD-10-CM | POA: Diagnosis not present

## 2018-12-22 DIAGNOSIS — R519 Headache, unspecified: Secondary | ICD-10-CM | POA: Diagnosis not present

## 2018-12-22 DIAGNOSIS — M791 Myalgia, unspecified site: Secondary | ICD-10-CM | POA: Diagnosis not present

## 2019-03-16 DIAGNOSIS — Z1231 Encounter for screening mammogram for malignant neoplasm of breast: Secondary | ICD-10-CM | POA: Diagnosis not present

## 2019-03-20 DIAGNOSIS — R739 Hyperglycemia, unspecified: Secondary | ICD-10-CM | POA: Diagnosis not present

## 2019-03-20 DIAGNOSIS — E785 Hyperlipidemia, unspecified: Secondary | ICD-10-CM | POA: Diagnosis not present

## 2019-03-20 DIAGNOSIS — L74519 Primary focal hyperhidrosis, unspecified: Secondary | ICD-10-CM | POA: Diagnosis not present

## 2019-03-20 DIAGNOSIS — D539 Nutritional anemia, unspecified: Secondary | ICD-10-CM | POA: Diagnosis not present

## 2019-03-20 DIAGNOSIS — I10 Essential (primary) hypertension: Secondary | ICD-10-CM | POA: Diagnosis not present

## 2019-03-28 DIAGNOSIS — M8580 Other specified disorders of bone density and structure, unspecified site: Secondary | ICD-10-CM | POA: Diagnosis not present

## 2019-03-28 DIAGNOSIS — Z981 Arthrodesis status: Secondary | ICD-10-CM | POA: Diagnosis not present

## 2019-03-28 DIAGNOSIS — M4726 Other spondylosis with radiculopathy, lumbar region: Secondary | ICD-10-CM | POA: Diagnosis not present

## 2019-03-28 DIAGNOSIS — M4316 Spondylolisthesis, lumbar region: Secondary | ICD-10-CM | POA: Diagnosis not present

## 2019-03-28 DIAGNOSIS — M47812 Spondylosis without myelopathy or radiculopathy, cervical region: Secondary | ICD-10-CM | POA: Diagnosis not present

## 2019-03-28 DIAGNOSIS — M5440 Lumbago with sciatica, unspecified side: Secondary | ICD-10-CM | POA: Diagnosis not present

## 2019-03-28 DIAGNOSIS — M4712 Other spondylosis with myelopathy, cervical region: Secondary | ICD-10-CM | POA: Diagnosis not present

## 2019-04-03 DIAGNOSIS — M47812 Spondylosis without myelopathy or radiculopathy, cervical region: Secondary | ICD-10-CM | POA: Diagnosis not present

## 2019-04-03 DIAGNOSIS — M4712 Other spondylosis with myelopathy, cervical region: Secondary | ICD-10-CM | POA: Diagnosis not present

## 2019-04-05 DIAGNOSIS — M545 Low back pain: Secondary | ICD-10-CM | POA: Diagnosis not present

## 2019-04-05 DIAGNOSIS — M542 Cervicalgia: Secondary | ICD-10-CM | POA: Diagnosis not present

## 2019-04-05 DIAGNOSIS — R2689 Other abnormalities of gait and mobility: Secondary | ICD-10-CM | POA: Diagnosis not present

## 2019-04-05 DIAGNOSIS — M25562 Pain in left knee: Secondary | ICD-10-CM | POA: Diagnosis not present

## 2019-04-09 DIAGNOSIS — R300 Dysuria: Secondary | ICD-10-CM | POA: Diagnosis not present

## 2019-04-09 DIAGNOSIS — N390 Urinary tract infection, site not specified: Secondary | ICD-10-CM | POA: Diagnosis not present

## 2019-04-23 DIAGNOSIS — M25562 Pain in left knee: Secondary | ICD-10-CM | POA: Diagnosis not present

## 2019-04-23 DIAGNOSIS — M545 Low back pain: Secondary | ICD-10-CM | POA: Diagnosis not present

## 2019-04-23 DIAGNOSIS — R2689 Other abnormalities of gait and mobility: Secondary | ICD-10-CM | POA: Diagnosis not present

## 2019-04-23 DIAGNOSIS — M542 Cervicalgia: Secondary | ICD-10-CM | POA: Diagnosis not present

## 2019-04-25 DIAGNOSIS — J4540 Moderate persistent asthma, uncomplicated: Secondary | ICD-10-CM | POA: Diagnosis not present

## 2019-04-25 DIAGNOSIS — K449 Diaphragmatic hernia without obstruction or gangrene: Secondary | ICD-10-CM | POA: Diagnosis not present

## 2019-04-25 DIAGNOSIS — R1310 Dysphagia, unspecified: Secondary | ICD-10-CM | POA: Diagnosis not present

## 2019-04-25 DIAGNOSIS — K219 Gastro-esophageal reflux disease without esophagitis: Secondary | ICD-10-CM | POA: Diagnosis not present

## 2019-04-26 DIAGNOSIS — M542 Cervicalgia: Secondary | ICD-10-CM | POA: Diagnosis not present

## 2019-04-26 DIAGNOSIS — M25562 Pain in left knee: Secondary | ICD-10-CM | POA: Diagnosis not present

## 2019-04-26 DIAGNOSIS — R2689 Other abnormalities of gait and mobility: Secondary | ICD-10-CM | POA: Diagnosis not present

## 2019-04-26 DIAGNOSIS — M545 Low back pain: Secondary | ICD-10-CM | POA: Diagnosis not present

## 2019-04-30 DIAGNOSIS — R21 Rash and other nonspecific skin eruption: Secondary | ICD-10-CM | POA: Diagnosis not present

## 2019-04-30 DIAGNOSIS — Z6834 Body mass index (BMI) 34.0-34.9, adult: Secondary | ICD-10-CM | POA: Diagnosis not present

## 2019-04-30 DIAGNOSIS — K219 Gastro-esophageal reflux disease without esophagitis: Secondary | ICD-10-CM | POA: Diagnosis not present

## 2019-05-01 DIAGNOSIS — M542 Cervicalgia: Secondary | ICD-10-CM | POA: Diagnosis not present

## 2019-05-01 DIAGNOSIS — R2689 Other abnormalities of gait and mobility: Secondary | ICD-10-CM | POA: Diagnosis not present

## 2019-05-01 DIAGNOSIS — M545 Low back pain: Secondary | ICD-10-CM | POA: Diagnosis not present

## 2019-05-01 DIAGNOSIS — M25562 Pain in left knee: Secondary | ICD-10-CM | POA: Diagnosis not present

## 2019-05-03 DIAGNOSIS — N390 Urinary tract infection, site not specified: Secondary | ICD-10-CM | POA: Diagnosis not present

## 2019-05-03 DIAGNOSIS — R319 Hematuria, unspecified: Secondary | ICD-10-CM | POA: Diagnosis not present

## 2019-05-03 DIAGNOSIS — R109 Unspecified abdominal pain: Secondary | ICD-10-CM | POA: Diagnosis not present

## 2019-05-03 DIAGNOSIS — Z6834 Body mass index (BMI) 34.0-34.9, adult: Secondary | ICD-10-CM | POA: Diagnosis not present

## 2019-05-21 DIAGNOSIS — R159 Full incontinence of feces: Secondary | ICD-10-CM | POA: Diagnosis not present

## 2019-05-21 DIAGNOSIS — Z6833 Body mass index (BMI) 33.0-33.9, adult: Secondary | ICD-10-CM | POA: Diagnosis not present

## 2019-05-21 DIAGNOSIS — N390 Urinary tract infection, site not specified: Secondary | ICD-10-CM | POA: Diagnosis not present

## 2019-05-22 DIAGNOSIS — R1314 Dysphagia, pharyngoesophageal phase: Secondary | ICD-10-CM | POA: Diagnosis not present

## 2019-05-22 DIAGNOSIS — Z9889 Other specified postprocedural states: Secondary | ICD-10-CM | POA: Diagnosis not present

## 2019-05-22 DIAGNOSIS — K219 Gastro-esophageal reflux disease without esophagitis: Secondary | ICD-10-CM | POA: Diagnosis not present

## 2019-05-22 DIAGNOSIS — R197 Diarrhea, unspecified: Secondary | ICD-10-CM | POA: Diagnosis not present

## 2019-05-23 DIAGNOSIS — K224 Dyskinesia of esophagus: Secondary | ICD-10-CM | POA: Diagnosis not present

## 2019-05-23 DIAGNOSIS — R1312 Dysphagia, oropharyngeal phase: Secondary | ICD-10-CM | POA: Diagnosis not present

## 2019-05-23 DIAGNOSIS — R1310 Dysphagia, unspecified: Secondary | ICD-10-CM | POA: Diagnosis not present

## 2019-05-23 DIAGNOSIS — K449 Diaphragmatic hernia without obstruction or gangrene: Secondary | ICD-10-CM | POA: Diagnosis not present

## 2019-05-23 DIAGNOSIS — K219 Gastro-esophageal reflux disease without esophagitis: Secondary | ICD-10-CM | POA: Diagnosis not present

## 2019-05-30 DIAGNOSIS — Z79899 Other long term (current) drug therapy: Secondary | ICD-10-CM | POA: Diagnosis not present

## 2019-05-30 DIAGNOSIS — B373 Candidiasis of vulva and vagina: Secondary | ICD-10-CM | POA: Diagnosis not present

## 2019-05-30 DIAGNOSIS — N390 Urinary tract infection, site not specified: Secondary | ICD-10-CM | POA: Diagnosis not present

## 2019-05-30 DIAGNOSIS — N952 Postmenopausal atrophic vaginitis: Secondary | ICD-10-CM | POA: Diagnosis not present

## 2019-05-30 DIAGNOSIS — N3946 Mixed incontinence: Secondary | ICD-10-CM | POA: Diagnosis not present

## 2019-05-31 DIAGNOSIS — Z6833 Body mass index (BMI) 33.0-33.9, adult: Secondary | ICD-10-CM | POA: Diagnosis not present

## 2019-05-31 DIAGNOSIS — Z1331 Encounter for screening for depression: Secondary | ICD-10-CM | POA: Diagnosis not present

## 2019-05-31 DIAGNOSIS — Z9181 History of falling: Secondary | ICD-10-CM | POA: Diagnosis not present

## 2019-05-31 DIAGNOSIS — N959 Unspecified menopausal and perimenopausal disorder: Secondary | ICD-10-CM | POA: Diagnosis not present

## 2019-05-31 DIAGNOSIS — E785 Hyperlipidemia, unspecified: Secondary | ICD-10-CM | POA: Diagnosis not present

## 2019-05-31 DIAGNOSIS — Z0000 Encounter for general adult medical examination without abnormal findings: Secondary | ICD-10-CM | POA: Diagnosis not present

## 2019-06-05 DIAGNOSIS — K224 Dyskinesia of esophagus: Secondary | ICD-10-CM | POA: Diagnosis not present

## 2019-06-05 DIAGNOSIS — Z9889 Other specified postprocedural states: Secondary | ICD-10-CM | POA: Diagnosis not present

## 2019-06-05 DIAGNOSIS — K219 Gastro-esophageal reflux disease without esophagitis: Secondary | ICD-10-CM | POA: Diagnosis not present

## 2019-06-06 DIAGNOSIS — N390 Urinary tract infection, site not specified: Secondary | ICD-10-CM | POA: Diagnosis not present

## 2019-06-14 DIAGNOSIS — R1314 Dysphagia, pharyngoesophageal phase: Secondary | ICD-10-CM | POA: Diagnosis not present

## 2019-06-19 DIAGNOSIS — H2703 Aphakia, bilateral: Secondary | ICD-10-CM | POA: Diagnosis not present

## 2019-06-21 DIAGNOSIS — R739 Hyperglycemia, unspecified: Secondary | ICD-10-CM | POA: Diagnosis not present

## 2019-06-21 DIAGNOSIS — I10 Essential (primary) hypertension: Secondary | ICD-10-CM | POA: Diagnosis not present

## 2019-06-21 DIAGNOSIS — E559 Vitamin D deficiency, unspecified: Secondary | ICD-10-CM | POA: Diagnosis not present

## 2019-06-21 DIAGNOSIS — D539 Nutritional anemia, unspecified: Secondary | ICD-10-CM | POA: Diagnosis not present

## 2019-06-21 DIAGNOSIS — E785 Hyperlipidemia, unspecified: Secondary | ICD-10-CM | POA: Diagnosis not present

## 2019-06-25 DIAGNOSIS — R05 Cough: Secondary | ICD-10-CM | POA: Diagnosis not present

## 2019-06-25 DIAGNOSIS — J4540 Moderate persistent asthma, uncomplicated: Secondary | ICD-10-CM | POA: Diagnosis not present

## 2019-06-25 DIAGNOSIS — K219 Gastro-esophageal reflux disease without esophagitis: Secondary | ICD-10-CM | POA: Diagnosis not present

## 2019-06-27 DIAGNOSIS — N952 Postmenopausal atrophic vaginitis: Secondary | ICD-10-CM | POA: Diagnosis not present

## 2019-06-27 DIAGNOSIS — B373 Candidiasis of vulva and vagina: Secondary | ICD-10-CM | POA: Diagnosis not present

## 2019-06-27 DIAGNOSIS — N390 Urinary tract infection, site not specified: Secondary | ICD-10-CM | POA: Diagnosis not present

## 2019-06-27 DIAGNOSIS — N3946 Mixed incontinence: Secondary | ICD-10-CM | POA: Diagnosis not present

## 2019-07-04 DIAGNOSIS — B373 Candidiasis of vulva and vagina: Secondary | ICD-10-CM | POA: Diagnosis not present

## 2019-07-04 DIAGNOSIS — N390 Urinary tract infection, site not specified: Secondary | ICD-10-CM | POA: Diagnosis not present

## 2019-07-04 DIAGNOSIS — N3946 Mixed incontinence: Secondary | ICD-10-CM | POA: Diagnosis not present

## 2019-07-30 DIAGNOSIS — N390 Urinary tract infection, site not specified: Secondary | ICD-10-CM | POA: Diagnosis not present

## 2019-08-01 DIAGNOSIS — R3982 Chronic bladder pain: Secondary | ICD-10-CM | POA: Diagnosis not present

## 2019-08-01 DIAGNOSIS — Z79899 Other long term (current) drug therapy: Secondary | ICD-10-CM | POA: Diagnosis not present

## 2019-08-01 DIAGNOSIS — N390 Urinary tract infection, site not specified: Secondary | ICD-10-CM | POA: Diagnosis not present

## 2019-08-06 DIAGNOSIS — N390 Urinary tract infection, site not specified: Secondary | ICD-10-CM | POA: Diagnosis not present

## 2019-08-06 DIAGNOSIS — R3982 Chronic bladder pain: Secondary | ICD-10-CM | POA: Diagnosis not present

## 2019-08-09 DIAGNOSIS — Z9889 Other specified postprocedural states: Secondary | ICD-10-CM | POA: Diagnosis not present

## 2019-08-09 DIAGNOSIS — N390 Urinary tract infection, site not specified: Secondary | ICD-10-CM | POA: Diagnosis not present

## 2019-08-09 DIAGNOSIS — R634 Abnormal weight loss: Secondary | ICD-10-CM | POA: Diagnosis not present

## 2019-08-09 DIAGNOSIS — R197 Diarrhea, unspecified: Secondary | ICD-10-CM | POA: Diagnosis not present

## 2019-08-09 DIAGNOSIS — R1110 Vomiting, unspecified: Secondary | ICD-10-CM | POA: Diagnosis not present

## 2019-08-09 DIAGNOSIS — R490 Dysphonia: Secondary | ICD-10-CM | POA: Diagnosis not present

## 2019-08-09 DIAGNOSIS — M810 Age-related osteoporosis without current pathological fracture: Secondary | ICD-10-CM | POA: Diagnosis not present

## 2019-08-09 DIAGNOSIS — R1314 Dysphagia, pharyngoesophageal phase: Secondary | ICD-10-CM | POA: Diagnosis not present

## 2019-08-14 DIAGNOSIS — R197 Diarrhea, unspecified: Secondary | ICD-10-CM | POA: Diagnosis not present

## 2019-08-16 DIAGNOSIS — B373 Candidiasis of vulva and vagina: Secondary | ICD-10-CM | POA: Diagnosis not present

## 2019-08-16 DIAGNOSIS — R3982 Chronic bladder pain: Secondary | ICD-10-CM | POA: Diagnosis not present

## 2019-08-16 DIAGNOSIS — N3946 Mixed incontinence: Secondary | ICD-10-CM | POA: Diagnosis not present

## 2019-08-16 DIAGNOSIS — N390 Urinary tract infection, site not specified: Secondary | ICD-10-CM | POA: Diagnosis not present

## 2019-08-20 DIAGNOSIS — R197 Diarrhea, unspecified: Secondary | ICD-10-CM | POA: Diagnosis not present

## 2019-08-20 DIAGNOSIS — R634 Abnormal weight loss: Secondary | ICD-10-CM | POA: Diagnosis not present

## 2019-08-20 DIAGNOSIS — R1110 Vomiting, unspecified: Secondary | ICD-10-CM | POA: Diagnosis not present

## 2019-08-23 DIAGNOSIS — R1314 Dysphagia, pharyngoesophageal phase: Secondary | ICD-10-CM | POA: Diagnosis not present

## 2019-08-23 DIAGNOSIS — R490 Dysphonia: Secondary | ICD-10-CM | POA: Diagnosis not present

## 2019-08-23 DIAGNOSIS — J383 Other diseases of vocal cords: Secondary | ICD-10-CM | POA: Diagnosis not present

## 2019-09-05 DIAGNOSIS — N3001 Acute cystitis with hematuria: Secondary | ICD-10-CM | POA: Diagnosis not present

## 2019-09-05 DIAGNOSIS — N3091 Cystitis, unspecified with hematuria: Secondary | ICD-10-CM | POA: Diagnosis not present

## 2019-09-05 DIAGNOSIS — N390 Urinary tract infection, site not specified: Secondary | ICD-10-CM | POA: Diagnosis not present

## 2019-09-05 DIAGNOSIS — R1084 Generalized abdominal pain: Secondary | ICD-10-CM | POA: Diagnosis not present

## 2019-09-05 DIAGNOSIS — Z20828 Contact with and (suspected) exposure to other viral communicable diseases: Secondary | ICD-10-CM | POA: Diagnosis not present

## 2019-09-05 DIAGNOSIS — K591 Functional diarrhea: Secondary | ICD-10-CM | POA: Diagnosis not present

## 2019-09-20 ENCOUNTER — Inpatient Hospital Stay (HOSPITAL_COMMUNITY)
Admission: EM | Admit: 2019-09-20 | Discharge: 2019-09-22 | DRG: 312 | Disposition: A | Discharge status: Home / Self Care | Payer: Medicare Other | Attending: Student | Admitting: Student

## 2019-09-20 ENCOUNTER — Encounter (HOSPITAL_COMMUNITY): Payer: Self-pay | Admitting: Emergency Medicine

## 2019-09-20 ENCOUNTER — Emergency Department (HOSPITAL_COMMUNITY): Payer: Medicare Other

## 2019-09-20 DIAGNOSIS — I10 Essential (primary) hypertension: Secondary | ICD-10-CM | POA: Diagnosis not present

## 2019-09-20 DIAGNOSIS — Z87440 Personal history of urinary (tract) infections: Secondary | ICD-10-CM

## 2019-09-20 DIAGNOSIS — Z888 Allergy status to other drugs, medicaments and biological substances status: Secondary | ICD-10-CM

## 2019-09-20 DIAGNOSIS — M25562 Pain in left knee: Secondary | ICD-10-CM | POA: Diagnosis not present

## 2019-09-20 DIAGNOSIS — Z79899 Other long term (current) drug therapy: Secondary | ICD-10-CM

## 2019-09-20 DIAGNOSIS — K219 Gastro-esophageal reflux disease without esophagitis: Secondary | ICD-10-CM | POA: Diagnosis present

## 2019-09-20 DIAGNOSIS — W19XXXA Unspecified fall, initial encounter: Secondary | ICD-10-CM | POA: Diagnosis not present

## 2019-09-20 DIAGNOSIS — F329 Major depressive disorder, single episode, unspecified: Secondary | ICD-10-CM | POA: Diagnosis present

## 2019-09-20 DIAGNOSIS — J383 Other diseases of vocal cords: Secondary | ICD-10-CM | POA: Diagnosis not present

## 2019-09-20 DIAGNOSIS — N390 Urinary tract infection, site not specified: Secondary | ICD-10-CM | POA: Diagnosis present

## 2019-09-20 DIAGNOSIS — Z7982 Long term (current) use of aspirin: Secondary | ICD-10-CM

## 2019-09-20 DIAGNOSIS — R55 Syncope and collapse: Secondary | ICD-10-CM

## 2019-09-20 DIAGNOSIS — R490 Dysphonia: Secondary | ICD-10-CM | POA: Diagnosis not present

## 2019-09-20 DIAGNOSIS — Z8673 Personal history of transient ischemic attack (TIA), and cerebral infarction without residual deficits: Secondary | ICD-10-CM

## 2019-09-20 DIAGNOSIS — S0012XA Contusion of left eyelid and periocular area, initial encounter: Secondary | ICD-10-CM | POA: Diagnosis present

## 2019-09-20 DIAGNOSIS — S0083XA Contusion of other part of head, initial encounter: Secondary | ICD-10-CM

## 2019-09-20 DIAGNOSIS — S8992XA Unspecified injury of left lower leg, initial encounter: Secondary | ICD-10-CM | POA: Diagnosis not present

## 2019-09-20 DIAGNOSIS — H269 Unspecified cataract: Secondary | ICD-10-CM | POA: Diagnosis present

## 2019-09-20 DIAGNOSIS — F419 Anxiety disorder, unspecified: Secondary | ICD-10-CM | POA: Diagnosis present

## 2019-09-20 DIAGNOSIS — E876 Hypokalemia: Secondary | ICD-10-CM | POA: Diagnosis present

## 2019-09-20 DIAGNOSIS — Z91013 Allergy to seafood: Secondary | ICD-10-CM

## 2019-09-20 DIAGNOSIS — R0902 Hypoxemia: Secondary | ICD-10-CM | POA: Diagnosis not present

## 2019-09-20 DIAGNOSIS — K589 Irritable bowel syndrome without diarrhea: Secondary | ICD-10-CM | POA: Diagnosis present

## 2019-09-20 DIAGNOSIS — S0990XA Unspecified injury of head, initial encounter: Secondary | ICD-10-CM | POA: Diagnosis not present

## 2019-09-20 DIAGNOSIS — J449 Chronic obstructive pulmonary disease, unspecified: Secondary | ICD-10-CM | POA: Diagnosis not present

## 2019-09-20 DIAGNOSIS — Z20822 Contact with and (suspected) exposure to covid-19: Secondary | ICD-10-CM | POA: Diagnosis not present

## 2019-09-20 DIAGNOSIS — S0512XA Contusion of eyeball and orbital tissues, left eye, initial encounter: Secondary | ICD-10-CM | POA: Diagnosis not present

## 2019-09-20 DIAGNOSIS — E785 Hyperlipidemia, unspecified: Secondary | ICD-10-CM | POA: Diagnosis present

## 2019-09-20 DIAGNOSIS — Z91048 Other nonmedicinal substance allergy status: Secondary | ICD-10-CM

## 2019-09-20 DIAGNOSIS — S199XXA Unspecified injury of neck, initial encounter: Secondary | ICD-10-CM | POA: Diagnosis not present

## 2019-09-20 DIAGNOSIS — W1830XA Fall on same level, unspecified, initial encounter: Secondary | ICD-10-CM | POA: Diagnosis present

## 2019-09-20 DIAGNOSIS — R220 Localized swelling, mass and lump, head: Secondary | ICD-10-CM | POA: Diagnosis not present

## 2019-09-20 DIAGNOSIS — Z9181 History of falling: Secondary | ICD-10-CM

## 2019-09-20 DIAGNOSIS — J90 Pleural effusion, not elsewhere classified: Secondary | ICD-10-CM | POA: Diagnosis not present

## 2019-09-20 HISTORY — DX: Syncope and collapse: R55

## 2019-09-20 LAB — URINALYSIS, ROUTINE W REFLEX MICROSCOPIC
Bilirubin Urine: NEGATIVE
Glucose, UA: NEGATIVE mg/dL
Ketones, ur: NEGATIVE mg/dL
Nitrite: NEGATIVE
Protein, ur: 30 mg/dL — AB
Specific Gravity, Urine: 1.013 (ref 1.005–1.030)
WBC, UA: >50 WBC/hpf — ABNORMAL HIGH (ref 0–5)
pH: 5 (ref 5.0–8.0)

## 2019-09-20 LAB — CBC
HCT: 42.4 % (ref 36.0–46.0)
Hemoglobin: 13.6 g/dL (ref 12.0–15.0)
MCH: 30.9 pg (ref 26.0–34.0)
MCHC: 32.1 g/dL (ref 30.0–36.0)
MCV: 96.4 fL (ref 80.0–100.0)
Platelets: 329 10*3/uL (ref 150–400)
RBC: 4.4 MIL/uL (ref 3.87–5.11)
RDW: 13.3 % (ref 11.5–15.5)
WBC: 9.4 10*3/uL (ref 4.0–10.5)
nRBC: 0 % (ref 0.0–0.2)

## 2019-09-20 LAB — COMPREHENSIVE METABOLIC PANEL
ALT: 18 U/L (ref 0–44)
AST: 17 U/L (ref 15–41)
Albumin: 4.2 g/dL (ref 3.5–5.0)
Alkaline Phosphatase: 102 U/L (ref 38–126)
Anion gap: 9 (ref 5–15)
BUN: 16 mg/dL (ref 8–23)
CO2: 26 mmol/L (ref 22–32)
Calcium: 9.9 mg/dL (ref 8.9–10.3)
Chloride: 104 mmol/L (ref 98–111)
Creatinine, Ser: 0.77 mg/dL (ref 0.44–1.00)
GFR calc Af Amer: >60 mL/min (ref >60)
GFR calc non Af Amer: >60 mL/min (ref >60)
Glucose, Bld: 99 mg/dL (ref 70–99)
Potassium: 3.7 mmol/L (ref 3.5–5.1)
Sodium: 139 mmol/L (ref 135–145)
Total Bilirubin: 0.4 mg/dL (ref 0.3–1.2)
Total Protein: 6.9 g/dL (ref 6.5–8.1)

## 2019-09-20 NOTE — ED Triage Notes (Addendum)
Per EMS-fell while visiting friend-syncopal episode-complaining of left cheek, arm and knee pain-bruise below left eye-no blood thinners-C-collar placed

## 2019-09-20 NOTE — ED Triage Notes (Signed)
Emergency Medicine Provider Triage Evaluation Note  Melinda Hartman , a 79 y.o. female  was evaluated in triage.  Pt complains of syncope, facial and neck pain.  Review of Systems  Positive: Recent uti Negative: No chest pain  Physical Exam  There were no vitals taken for this visit. Gen:   Awake, no distress    HEENT:  Contusion left cheek Resp:  Normal effort  Cardiac:  Normal rate  Abd:   Nondistended, nontender  MSK:   Moves extremities without difficulty  Neuro:  Speech clear   Medical Decision Making  Medically screening exam initiated at 5:05 PM.  Appropriate orders placed.  Kiri Hinderliter was informed that the remainder of the evaluation will be completed by another provider, this initial triage assessment does not replace that evaluation, and the importance of remaining in the ED until their evaluation is complete.  Clinical Impression  Fall/syncope Facial ttp Neck pain left knee pain   Pattricia Boss, MD 09/20/19 [phone]

## 2019-09-20 NOTE — ED Provider Notes (Signed)
[facility] DEPT Provider Note   CSN: [id number] Arrival date & time: 09/20/19  1653     History Chief Complaint  Patient presents with  . Fall    Melinda Hartman is a 79 y.o. female.  HPI    79 year old female history of hypertension, COPD, recent urinary tract infection presents today after syncopal episode.  She was visiting a family member in a nursing home.  She denies any prodrome.  There is no report of seizure activity.  She fell and landed on her face with pain and contusions to her left periorbital area and her left knee.  She is currently awake alert and oriented and has no complaints other than those from the fall.  She denies any chest pain, lightheadedness, generalized weakness, or recent changes in medications.  Past Medical History:  Diagnosis Date  . Acid reflux   . Anxiety   . Arthritis   . Asthma   . Cataracts, bilateral   . COPD (chronic obstructive pulmonary disease) ([place])   . H/O bladder problems   . Hypertension   . IBS (irritable bowel syndrome)   . Osteoporosis   . Pneumonia     There are no problems to display for this patient.   Past Surgical History:  Procedure Laterality Date  . ABDOMINAL HYSTERECTOMY  1977  . APPENDECTOMY    . BACK SURGERY     x4 22 screws 2 rods  . COLONOSCOPY  04/11/2014   Mild diverticulosis. Otherwise normal colonoscopy. Highly redundant colon.   Marland Kitchen GALLBLADDER SURGERY  02/26/2017  . HERNIA REPAIR  02/26/2017  . KNEE ARTHROSCOPY Left      OB History   No obstetric history on file.     Family History  Problem Relation Age of Onset  . Stomach cancer Mother   . Colon cancer Neg Hx   . Esophageal cancer Neg Hx     Social History   Tobacco Use  . Smoking status: Never Smoker  . Smokeless tobacco: Never Used  Vaping Use  . Vaping Use: Never used  Substance Use Topics  . Alcohol use: No  . Drug use: No    Home Medications Prior to Admission medications   Medication Sig  Start Date End Date Taking? Authorizing Provider  acetaminophen (TYLENOL) 325 MG tablet Take by mouth. 10/24/15   [provider]  albuterol (PROVENTIL HFA;VENTOLIN HFA) 108 (90 Base) MCG/ACT inhaler Inhale into the lungs.    [provider]  albuterol (PROVENTIL) (2.5 MG/3ML) 0.083% nebulizer solution Inhale into the lungs.    [provider]  aspirin EC 81 MG tablet Take 81 mg by mouth.    [provider]  celecoxib (CELEBREX) 100 MG capsule Take 100 mg by mouth 2 (two) times daily.    [provider]  chlorpheniramine-HYDROcodone ([place]) 10-8 MG/5ML SUER Take by mouth. 12/07/13   [provider]  Cholecalciferol (VITAMIN D3) 1.25 MG (50000 UT) CAPS Take 1 capsule by mouth once a week.    [provider]  cholestyramine (QUESTRAN) 4 g packet Take once daily 2 hours before or after all other medications. 10/10/18   [name], MD  clonazePAM [place]bye [place]) 0.5 MG tablet  05/10/16   [provider]  conjugated estrogens (PREMARIN) vaginal cream Place vaginally. 02/06/16   [provider]  DULoxetine (CYMBALTA) 20 MG capsule Take 20 mg by mouth.    [provider]  EPINEPHrine (EPIPEN 2-PAK) 0.3 mg/0.3 mL IJ SOAJ injection as needed.  04/17/13   [provider]  ezetimibe (ZETIA) 10 MG tablet Take 10 mg by mouth daily.    [provider]  FIBER PO Take by mouth.    [provider]  fluticasone (CUTIVATE) 0.05 % cream Apply topically 2 (two) times daily. 08/12/17   [name], MD  Fluticasone-Salmeterol (ADVAIR DISKUS) 250-50 MCG/DOSE AEPB Inhale into the lungs.    [provider]  gabapentin (NEURONTIN) 300 MG capsule Take 600 mg by mouth 2 (two) times daily as needed (sometimes 3 times daily).    [provider]  MICARDIS 40 MG tablet  04/30/16   [provider]  montelukast (SINGULAIR) 10 MG tablet Take by mouth. 04/26/07   [provider]    nitroGLYCERIN (NITROSTAT) 0.4 MG SL tablet Place 0.4 mg under the tongue.    [provider]  pantoprazole (PROTONIX) 40 MG tablet Take by mouth.    [provider]  polyethylene glycol (MIRALAX / GLYCOLAX) packet Take 17 g by mouth daily as needed.  04/26/07   [provider]  potassium chloride SA (K-DUR,KLOR-CON) 20 MEQ tablet Take by mouth.    [provider]  traMADol (ULTRAM) 50 MG tablet Take by mouth.    [provider]    Allergies    Caffeine, Haloperidol, Food, Nicotine, and Tape  Review of Systems   Review of Systems  All other systems reviewed and are negative.   Physical Exam Updated Vital Signs BP (!) 159/95 (BP Location: Right Arm)   Pulse 89   Temp 98.4 F (36.9 C) (Rectal)   Resp 16   SpO2 94%   Physical Exam Vitals and nursing note reviewed.  Constitutional:      General: She is not in acute distress.    Appearance: Normal appearance.  HENT:     Head: Normocephalic.     Comments: Contusion over left cheek with some tenderness but no crepitus    Right Ear: External ear normal.     Left Ear: External ear normal.     Nose: Nose normal.     Mouth/Throat:     Mouth: Mucous membranes are moist.  Eyes:     Extraocular Movements: Extraocular movements intact.     Pupils: Pupils are equal, round, and reactive to light.  Neck:     Comments: Mild posterior tenderness palpation Cardiovascular:     Rate and Rhythm: Normal rate and regular rhythm.     Pulses: Normal pulses.     Heart sounds: Normal heart sounds.  Pulmonary:     Effort: Pulmonary effort is normal.     Breath sounds: Normal breath sounds.  Abdominal:     General: Abdomen is flat.  Musculoskeletal:        General: Normal range of motion.     Cervical back: Normal range of motion.     Comments: Mild tenderness palpation of her left knee with contusion noted  Skin:    General: Skin is warm and dry.     Capillary Refill: Capillary refill takes less  than 2 seconds.  Neurological:     General: No focal deficit present.     Mental Status: She is alert and oriented to person, place, and time.  Psychiatric:        Mood and Affect: Mood normal.        Behavior: Behavior normal.     ED Results / Procedures / Treatments   Labs (all labs ordered are listed, but only abnormal results  are displayed) Labs Reviewed  CULTURE, BLOOD (ROUTINE X 2)  CULTURE, BLOOD (ROUTINE X 2)  CBC  COMPREHENSIVE METABOLIC PANEL  URINALYSIS, ROUTINE W REFLEX MICROSCOPIC    EKG None ED ECG REPORT   Date: 09/20/2019  Rate: 87  Rhythm: normal sinus rhythm  QRS Axis: normal  Intervals: normal  ST/T Wave abnormalities: normal  Conduction Disutrbances:none  Narrative Interpretation:   Old EKG Reviewed: none available  I have personally reviewed the EKG tracing and agree with the computerized printout as noted.  Radiology CT Head Wo Contrast  Result Date: 09/20/2019 CLINICAL DATA:  Fall and syncopal episode, left cheek pain and infraorbital bruising EXAM: CT HEAD WITHOUT CONTRAST CT MAXILLOFACIAL WITHOUT CONTRAST CT CERVICAL SPINE WITHOUT CONTRAST TECHNIQUE: Multidetector CT imaging of the head, cervical spine, and maxillofacial structures were performed using the standard protocol without intravenous contrast. Multiplanar CT image reconstructions of the cervical spine and maxillofacial structures were also generated. COMPARISON:  MRI 06/17/2017, CT head and cervical spine 06/17/2017 FINDINGS: CT HEAD FINDINGS Brain: No evidence of acute infarction, hemorrhage, hydrocephalus, extra-axial collection or mass lesion/mass effect. Basal cisterns are patent. Midline intracranial structures are normal. Cerebellar tonsils are normally positioned. Symmetric prominence of the ventricles, cisterns and sulci compatible with parenchymal volume loss. Patchy areas of white matter hypoattenuation are most compatible with mild-to-moderate chronic microvascular angiopathy.  Benign dural calcifications are similar to priors. Vascular: Atherosclerotic calcification of the carotid siphons. No hyperdense vessel. Skull: Minimal left frontal scalp swelling without large hematoma or calvarial fracture. No other acute or suspicious osseous abnormality Other: None. CT MAXILLOFACIAL FINDINGS Osseous: No fracture of the bony orbits. Nasal bones are intact. No other mid face fractures are seen. The pterygoid plates are intact. No visible or suspected temporal bone fractures. Temporomandibular joints are normally aligned. The mandible is intact. No fractured or avulsed teeth. Orbits: Left infraorbital soft tissue swelling. No retro septal gas, stranding or hemorrhage. Prior bilateral lens extractions. Globes appear otherwise normal and symmetric. Symmetric appearance of the extraocular musculature and optic nerve sheath complexes. Normal caliber of the superior ophthalmic veins. Sinuses: Paranasal sinuses and mastoid air cells are predominantly clear. Middle ear cavities are clear. Ossicular chains are normally configured. Soft tissues: Left infraorbital and malar soft tissue swelling without soft tissue gas or foreign body. Remote soft tissue thickening and radiodensity in the subcutaneous tissues of the superior nasal bridge unchanged from prior and may reflect some chronic scarring. No other significant soft tissue abnormalities. CT CERVICAL SPINE FINDINGS Alignment: Stabilization collar is in place at the time of examination. There is mild straightening of the normal cervical lordosis which is slightly more pronounced than on the comparison imaging in the lower cervical spine possibly related to positioning, stabilization or potential muscle spasm. Stepwise retrolisthesis C5-C7 and anterolisthesis C7 on T1 are grossly unchanged from comparison and favored to be on a degenerative, spondylitic basis. No evidence of traumatic listhesis. No abnormally widened, perched or jumped facets. Normal  alignment of the craniocervical and atlantoaxial articulations. Skull base and vertebrae: No acute skull base fracture. No vertebral body fracture or height loss. Normal bone mineralization. No worrisome osseous lesions. Moderate atlantodental arthrosis with periarticular spurring and pseudoarticulation with the basion is unchanged from comparison. Multilevel spondylitic changes are better detailed below. Soft tissues and spinal canal: No pre or paravertebral fluid or swelling. No visible canal hematoma. Cervical carotid atherosclerosis with a slightly medialized course of the right internal carotid artery. Disc levels: Multilevel intervertebral disc height loss with spondylitic endplate changes.  Slightly larger disc osteophyte complexes are present C4-C7 with at most mild canal stenosis at the C6-7 level. Multilevel uncinate spurring and facet hypertrophic changes are present as well with mild multilevel neural foraminal narrowing and more moderate bilateral foraminal impingement at C5-6 and C6-7. Upper chest: Stable calcified granuloma seen in the right lung apex. No acute abnormality in the upper chest or imaged lung apices. Some mild calcification in the visible proximal great vessels. Other: Normal thyroid gland. IMPRESSION: 1. Minimal left frontal scalp swelling without large hematoma or calvarial fracture. No acute intracranial abnormality. 2. Stable mild-to-moderate parenchymal volume loss and chronic microvascular ischemic white matter disease. 3. Left infraorbital and malar soft tissue swelling without soft tissue gas or foreign body. 4. No acute facial bone fracture or orbital injury identified. 5. No acute cervical spine fracture or traumatic listhesis. 6. Multilevel degenerative changes of the cervical spine as described above. 7. Cervical and intracranial atherosclerosis. Electronically Signed   By: Lovena Le M.D.   On: 09/20/2019 18:36   CT Cervical Spine Wo Contrast  Result Date:  09/20/2019 CLINICAL DATA:  Fall and syncopal episode, left cheek pain and infraorbital bruising EXAM: CT HEAD WITHOUT CONTRAST CT MAXILLOFACIAL WITHOUT CONTRAST CT CERVICAL SPINE WITHOUT CONTRAST TECHNIQUE: Multidetector CT imaging of the head, cervical spine, and maxillofacial structures were performed using the standard protocol without intravenous contrast. Multiplanar CT image reconstructions of the cervical spine and maxillofacial structures were also generated. COMPARISON:  MRI 06/17/2017, CT head and cervical spine 06/17/2017 FINDINGS: CT HEAD FINDINGS Brain: No evidence of acute infarction, hemorrhage, hydrocephalus, extra-axial collection or mass lesion/mass effect. Basal cisterns are patent. Midline intracranial structures are normal. Cerebellar tonsils are normally positioned. Symmetric prominence of the ventricles, cisterns and sulci compatible with parenchymal volume loss. Patchy areas of white matter hypoattenuation are most compatible with mild-to-moderate chronic microvascular angiopathy. Benign dural calcifications are similar to priors. Vascular: Atherosclerotic calcification of the carotid siphons. No hyperdense vessel. Skull: Minimal left frontal scalp swelling without large hematoma or calvarial fracture. No other acute or suspicious osseous abnormality Other: None. CT MAXILLOFACIAL FINDINGS Osseous: No fracture of the bony orbits. Nasal bones are intact. No other mid face fractures are seen. The pterygoid plates are intact. No visible or suspected temporal bone fractures. Temporomandibular joints are normally aligned. The mandible is intact. No fractured or avulsed teeth. Orbits: Left infraorbital soft tissue swelling. No retro septal gas, stranding or hemorrhage. Prior bilateral lens extractions. Globes appear otherwise normal and symmetric. Symmetric appearance of the extraocular musculature and optic nerve sheath complexes. Normal caliber of the superior ophthalmic veins. Sinuses: Paranasal  sinuses and mastoid air cells are predominantly clear. Middle ear cavities are clear. Ossicular chains are normally configured. Soft tissues: Left infraorbital and malar soft tissue swelling without soft tissue gas or foreign body. Remote soft tissue thickening and radiodensity in the subcutaneous tissues of the superior nasal bridge unchanged from prior and may reflect some chronic scarring. No other significant soft tissue abnormalities. CT CERVICAL SPINE FINDINGS Alignment: Stabilization collar is in place at the time of examination. There is mild straightening of the normal cervical lordosis which is slightly more pronounced than on the comparison imaging in the lower cervical spine possibly related to positioning, stabilization or potential muscle spasm. Stepwise retrolisthesis C5-C7 and anterolisthesis C7 on T1 are grossly unchanged from comparison and favored to be on a degenerative, spondylitic basis. No evidence of traumatic listhesis. No abnormally widened, perched or jumped facets. Normal alignment of the craniocervical and atlantoaxial articulations.  Skull base and vertebrae: No acute skull base fracture. No vertebral body fracture or height loss. Normal bone mineralization. No worrisome osseous lesions. Moderate atlantodental arthrosis with periarticular spurring and pseudoarticulation with the basion is unchanged from comparison. Multilevel spondylitic changes are better detailed below. Soft tissues and spinal canal: No pre or paravertebral fluid or swelling. No visible canal hematoma. Cervical carotid atherosclerosis with a slightly medialized course of the right internal carotid artery. Disc levels: Multilevel intervertebral disc height loss with spondylitic endplate changes. Slightly larger disc osteophyte complexes are present C4-C7 with at most mild canal stenosis at the C6-7 level. Multilevel uncinate spurring and facet hypertrophic changes are present as well with mild multilevel neural  foraminal narrowing and more moderate bilateral foraminal impingement at C5-6 and C6-7. Upper chest: Stable calcified granuloma seen in the right lung apex. No acute abnormality in the upper chest or imaged lung apices. Some mild calcification in the visible proximal great vessels. Other: Normal thyroid gland. IMPRESSION: 1. Minimal left frontal scalp swelling without large hematoma or calvarial fracture. No acute intracranial abnormality. 2. Stable mild-to-moderate parenchymal volume loss and chronic microvascular ischemic white matter disease. 3. Left infraorbital and malar soft tissue swelling without soft tissue gas or foreign body. 4. No acute facial bone fracture or orbital injury identified. 5. No acute cervical spine fracture or traumatic listhesis. 6. Multilevel degenerative changes of the cervical spine as described above. 7. Cervical and intracranial atherosclerosis. Electronically Signed   By: Lovena Le M.D.   On: 09/20/2019 18:36   DG Chest Port 1 View  Result Date: 09/20/2019 CLINICAL DATA:  Pt states she fell while visiting friend. Per notes- syncopal episode-complaining of left knee pain EXAM: PORTABLE CHEST 1 VIEW COMPARISON:  06/17/2017. FINDINGS: Cardiac silhouette is normal in size. No mediastinal or hilar masses. Linear opacities are noted at both lung bases consistent with atelectasis, similar to the prior chest radiograph and accentuated by low lung volumes. Remainder of the lungs is clear. No convincing pleural effusion and no pneumothorax. Spine fusion/stabilization hardware with pedicle screws and interconnecting rods. Interconnecting rods appear disrupted or unconnected, on the right at the L1-L2 level on the on the left closer to the L2-L3 level. IMPRESSION: 1. No acute cardiopulmonary disease. 2. Linear lung base opacities consistent with atelectasis. No convincing pneumonia or pulmonary edema. 3. Questionable disruption of interconnecting rods along the thoracolumbar fusion.  Electronically Signed   By: Lajean Manes M.D.   On: 09/20/2019 18:18   DG Knee Complete 4 Views Left  Result Date: 09/20/2019 CLINICAL DATA:  Pt states she fell while visiting friend. Per notes- syncopal episode-complaining of left knee pain EXAM: LEFT KNEE - COMPLETE 4+ VIEW COMPARISON:  05/18/2016 FINDINGS: No fracture or bone lesion. Moderate medial joint space compartment narrowing associated marginal osteophytes. Small marginal osteophytes from patella. Lateral compartment is relatively well preserved. No joint effusion. Surrounding soft tissues are unremarkable. IMPRESSION: 1. No fracture or acute finding. 2. Moderate osteoarthritis. Electronically Signed   By: Lajean Manes M.D.   On: 09/20/2019 18:14   CT MAXILLOFACIAL WO CONTRAST  Result Date: 09/20/2019 CLINICAL DATA:  Fall and syncopal episode, left cheek pain and infraorbital bruising EXAM: CT HEAD WITHOUT CONTRAST CT MAXILLOFACIAL WITHOUT CONTRAST CT CERVICAL SPINE WITHOUT CONTRAST TECHNIQUE: Multidetector CT imaging of the head, cervical spine, and maxillofacial structures were performed using the standard protocol without intravenous contrast. Multiplanar CT image reconstructions of the cervical spine and maxillofacial structures were also generated. COMPARISON:  MRI 06/17/2017, CT head and  cervical spine 06/17/2017 FINDINGS: CT HEAD FINDINGS Brain: No evidence of acute infarction, hemorrhage, hydrocephalus, extra-axial collection or mass lesion/mass effect. Basal cisterns are patent. Midline intracranial structures are normal. Cerebellar tonsils are normally positioned. Symmetric prominence of the ventricles, cisterns and sulci compatible with parenchymal volume loss. Patchy areas of white matter hypoattenuation are most compatible with mild-to-moderate chronic microvascular angiopathy. Benign dural calcifications are similar to priors. Vascular: Atherosclerotic calcification of the carotid siphons. No hyperdense vessel. Skull: Minimal left  frontal scalp swelling without large hematoma or calvarial fracture. No other acute or suspicious osseous abnormality Other: None. CT MAXILLOFACIAL FINDINGS Osseous: No fracture of the bony orbits. Nasal bones are intact. No other mid face fractures are seen. The pterygoid plates are intact. No visible or suspected temporal bone fractures. Temporomandibular joints are normally aligned. The mandible is intact. No fractured or avulsed teeth. Orbits: Left infraorbital soft tissue swelling. No retro septal gas, stranding or hemorrhage. Prior bilateral lens extractions. Globes appear otherwise normal and symmetric. Symmetric appearance of the extraocular musculature and optic nerve sheath complexes. Normal caliber of the superior ophthalmic veins. Sinuses: Paranasal sinuses and mastoid air cells are predominantly clear. Middle ear cavities are clear. Ossicular chains are normally configured. Soft tissues: Left infraorbital and malar soft tissue swelling without soft tissue gas or foreign body. Remote soft tissue thickening and radiodensity in the subcutaneous tissues of the superior nasal bridge unchanged from prior and may reflect some chronic scarring. No other significant soft tissue abnormalities. CT CERVICAL SPINE FINDINGS Alignment: Stabilization collar is in place at the time of examination. There is mild straightening of the normal cervical lordosis which is slightly more pronounced than on the comparison imaging in the lower cervical spine possibly related to positioning, stabilization or potential muscle spasm. Stepwise retrolisthesis C5-C7 and anterolisthesis C7 on T1 are grossly unchanged from comparison and favored to be on a degenerative, spondylitic basis. No evidence of traumatic listhesis. No abnormally widened, perched or jumped facets. Normal alignment of the craniocervical and atlantoaxial articulations. Skull base and vertebrae: No acute skull base fracture. No vertebral body fracture or height loss.  Normal bone mineralization. No worrisome osseous lesions. Moderate atlantodental arthrosis with periarticular spurring and pseudoarticulation with the basion is unchanged from comparison. Multilevel spondylitic changes are better detailed below. Soft tissues and spinal canal: No pre or paravertebral fluid or swelling. No visible canal hematoma. Cervical carotid atherosclerosis with a slightly medialized course of the right internal carotid artery. Disc levels: Multilevel intervertebral disc height loss with spondylitic endplate changes. Slightly larger disc osteophyte complexes are present C4-C7 with at most mild canal stenosis at the C6-7 level. Multilevel uncinate spurring and facet hypertrophic changes are present as well with mild multilevel neural foraminal narrowing and more moderate bilateral foraminal impingement at C5-6 and C6-7. Upper chest: Stable calcified granuloma seen in the right lung apex. No acute abnormality in the upper chest or imaged lung apices. Some mild calcification in the visible proximal great vessels. Other: Normal thyroid gland. IMPRESSION: 1. Minimal left frontal scalp swelling without large hematoma or calvarial fracture. No acute intracranial abnormality. 2. Stable mild-to-moderate parenchymal volume loss and chronic microvascular ischemic white matter disease. 3. Left infraorbital and malar soft tissue swelling without soft tissue gas or foreign body. 4. No acute facial bone fracture or orbital injury identified. 5. No acute cervical spine fracture or traumatic listhesis. 6. Multilevel degenerative changes of the cervical spine as described above. 7. Cervical and intracranial atherosclerosis. Electronically Signed   By: Lovena Le  M.D.   On: 09/20/2019 18:36    Procedures Procedures (including critical care time)  Medications Ordered in ED Medications - No data to display  ED Course  I have reviewed the triage vital signs and the nursing notes.  Pertinent labs &  imaging results that were available during my care of the patient were reviewed by me and considered in my medical decision making (see chart for details).  Clinical Course as of Sep 19 2312  Thu Sep 20, 2019  2312 All radographs reviewed Noted ? Disruption of rods on cxr- not clinically correlated as acute with no ttp   [DR]    Clinical Course User Index [DR] Pattricia Boss, MD   MDM Rules/Calculators/A&P                          79 yo female presents today with syncope.  No prodrome and no postictal phase. CT scan and plain films reveal contusion but no fractures or ich Presentation concerning for cardiac syncope No arrhythmia noted here    Final Clinical Impression(s) / ED Diagnoses Final diagnoses:  Syncope, unspecified syncope type  Contusion of face, initial encounter    Rx / DC Orders ED Discharge Orders    None       Pattricia Boss, MD 09/21/19 0002

## 2019-09-21 ENCOUNTER — Encounter (HOSPITAL_COMMUNITY): Payer: Self-pay | Admitting: Internal Medicine

## 2019-09-21 ENCOUNTER — Other Ambulatory Visit: Payer: Self-pay | Admitting: Cardiology

## 2019-09-21 ENCOUNTER — Observation Stay (HOSPITAL_COMMUNITY): Payer: Medicare Other

## 2019-09-21 DIAGNOSIS — R55 Syncope and collapse: Secondary | ICD-10-CM | POA: Diagnosis present

## 2019-09-21 DIAGNOSIS — F329 Major depressive disorder, single episode, unspecified: Secondary | ICD-10-CM

## 2019-09-21 DIAGNOSIS — Z91013 Allergy to seafood: Secondary | ICD-10-CM | POA: Diagnosis not present

## 2019-09-21 DIAGNOSIS — N390 Urinary tract infection, site not specified: Secondary | ICD-10-CM

## 2019-09-21 DIAGNOSIS — I10 Essential (primary) hypertension: Secondary | ICD-10-CM

## 2019-09-21 DIAGNOSIS — J449 Chronic obstructive pulmonary disease, unspecified: Secondary | ICD-10-CM | POA: Diagnosis present

## 2019-09-21 DIAGNOSIS — Z888 Allergy status to other drugs, medicaments and biological substances status: Secondary | ICD-10-CM | POA: Diagnosis not present

## 2019-09-21 DIAGNOSIS — Z9181 History of falling: Secondary | ICD-10-CM | POA: Diagnosis not present

## 2019-09-21 DIAGNOSIS — E785 Hyperlipidemia, unspecified: Secondary | ICD-10-CM | POA: Diagnosis present

## 2019-09-21 DIAGNOSIS — Z87440 Personal history of urinary (tract) infections: Secondary | ICD-10-CM | POA: Diagnosis not present

## 2019-09-21 DIAGNOSIS — K219 Gastro-esophageal reflux disease without esophagitis: Secondary | ICD-10-CM | POA: Diagnosis present

## 2019-09-21 DIAGNOSIS — Z20822 Contact with and (suspected) exposure to covid-19: Secondary | ICD-10-CM | POA: Diagnosis present

## 2019-09-21 DIAGNOSIS — W1830XA Fall on same level, unspecified, initial encounter: Secondary | ICD-10-CM | POA: Diagnosis present

## 2019-09-21 DIAGNOSIS — Z7982 Long term (current) use of aspirin: Secondary | ICD-10-CM | POA: Diagnosis not present

## 2019-09-21 DIAGNOSIS — Z79899 Other long term (current) drug therapy: Secondary | ICD-10-CM | POA: Diagnosis not present

## 2019-09-21 DIAGNOSIS — K589 Irritable bowel syndrome without diarrhea: Secondary | ICD-10-CM | POA: Diagnosis present

## 2019-09-21 DIAGNOSIS — E876 Hypokalemia: Secondary | ICD-10-CM

## 2019-09-21 DIAGNOSIS — Z9189 Other specified personal risk factors, not elsewhere classified: Secondary | ICD-10-CM | POA: Diagnosis not present

## 2019-09-21 DIAGNOSIS — S0012XA Contusion of left eyelid and periocular area, initial encounter: Secondary | ICD-10-CM | POA: Diagnosis present

## 2019-09-21 DIAGNOSIS — Z91048 Other nonmedicinal substance allergy status: Secondary | ICD-10-CM | POA: Diagnosis not present

## 2019-09-21 DIAGNOSIS — Z8673 Personal history of transient ischemic attack (TIA), and cerebral infarction without residual deficits: Secondary | ICD-10-CM | POA: Diagnosis not present

## 2019-09-21 DIAGNOSIS — H269 Unspecified cataract: Secondary | ICD-10-CM | POA: Diagnosis present

## 2019-09-21 DIAGNOSIS — F419 Anxiety disorder, unspecified: Secondary | ICD-10-CM

## 2019-09-21 DIAGNOSIS — S0083XA Contusion of other part of head, initial encounter: Secondary | ICD-10-CM | POA: Diagnosis present

## 2019-09-21 HISTORY — DX: Essential (primary) hypertension: I10

## 2019-09-21 HISTORY — DX: Urinary tract infection, site not specified: N39.0

## 2019-09-21 LAB — CBC
HCT: 38.7 % (ref 36.0–46.0)
Hemoglobin: 12.5 g/dL (ref 12.0–15.0)
MCH: 31 pg (ref 26.0–34.0)
MCHC: 32.3 g/dL (ref 30.0–36.0)
MCV: 96 fL (ref 80.0–100.0)
Platelets: 284 10*3/uL (ref 150–400)
RBC: 4.03 MIL/uL (ref 3.87–5.11)
RDW: 13.2 % (ref 11.5–15.5)
WBC: 7.6 10*3/uL (ref 4.0–10.5)
nRBC: 0 % (ref 0.0–0.2)

## 2019-09-21 LAB — ECHOCARDIOGRAM COMPLETE
Area-P 1/2: 2.37 cm2
S' Lateral: 2.4 cm

## 2019-09-21 LAB — BASIC METABOLIC PANEL
Anion gap: 8 (ref 5–15)
BUN: 14 mg/dL (ref 8–23)
CO2: 27 mmol/L (ref 22–32)
Calcium: 8.7 mg/dL — ABNORMAL LOW (ref 8.9–10.3)
Chloride: 107 mmol/L (ref 98–111)
Creatinine, Ser: 0.62 mg/dL (ref 0.44–1.00)
GFR calc Af Amer: >60 mL/min (ref >60)
GFR calc non Af Amer: >60 mL/min (ref >60)
Glucose, Bld: 105 mg/dL — ABNORMAL HIGH (ref 70–99)
Potassium: 3.3 mmol/L — ABNORMAL LOW (ref 3.5–5.1)
Sodium: 142 mmol/L (ref 135–145)

## 2019-09-21 LAB — SARS CORONAVIRUS 2 BY RT PCR (HOSPITAL ORDER, PERFORMED IN ~~LOC~~ HOSPITAL LAB): SARS Coronavirus 2: NEGATIVE

## 2019-09-21 LAB — MAGNESIUM: Magnesium: 2.3 mg/dL (ref 1.7–2.4)

## 2019-09-21 MED ORDER — CLONAZEPAM 1 MG PO TABS
1.0000 mg | ORAL_TABLET | Freq: Two times a day (BID) | ORAL | Status: DC
  Administered 2019-09-21 – 2019-09-22 (×4): 1 mg via ORAL
  Filled 2019-09-21: qty 1
  Filled 2019-09-21: qty 2
  Filled 2019-09-21: qty 1
  Filled 2019-09-21: qty 2

## 2019-09-21 MED ORDER — ONDANSETRON HCL 4 MG/2ML IJ SOLN
4.0000 mg | Freq: Four times a day (QID) | INTRAMUSCULAR | Status: DC | PRN
  Filled 2019-09-21: qty 2

## 2019-09-21 MED ORDER — NITROGLYCERIN 0.4 MG SL SUBL: 0.4000 mg | SUBLINGUAL_TABLET | SUBLINGUAL | Status: DC | PRN

## 2019-09-21 MED ORDER — GABAPENTIN 300 MG PO CAPS
600.0000 mg | ORAL_CAPSULE | Freq: Two times a day (BID) | ORAL | Status: DC | PRN
  Administered 2019-09-21 – 2019-09-22 (×2): 600 mg via ORAL
  Filled 2019-09-21 (×2): qty 2

## 2019-09-21 MED ORDER — EZETIMIBE 10 MG PO TABS
10.0000 mg | ORAL_TABLET | Freq: Every day | ORAL | Status: DC
  Administered 2019-09-21 – 2019-09-22 (×2): 10 mg via ORAL
  Filled 2019-09-21 (×2): qty 1

## 2019-09-21 MED ORDER — ONDANSETRON HCL 4 MG PO TABS: 4.0000 mg | ORAL_TABLET | Freq: Four times a day (QID) | ORAL | Status: DC | PRN

## 2019-09-21 MED ORDER — PANTOPRAZOLE SODIUM 40 MG PO TBEC
40.0000 mg | DELAYED_RELEASE_TABLET | Freq: Every day | ORAL | Status: DC
  Administered 2019-09-21 – 2019-09-22 (×2): 40 mg via ORAL
  Filled 2019-09-21 (×2): qty 1

## 2019-09-21 MED ORDER — VITAMIN D 25 MCG (1000 UNIT) PO TABS
1000.0000 [IU] | ORAL_TABLET | Freq: Every day | ORAL | Status: DC
  Administered 2019-09-21 – 2019-09-22 (×2): 1000 [IU] via ORAL
  Filled 2019-09-21 (×2): qty 1

## 2019-09-21 MED ORDER — IRBESARTAN 150 MG PO TABS
150.0000 mg | ORAL_TABLET | Freq: Every day | ORAL | Status: DC
  Administered 2019-09-21: 150 mg via ORAL
  Filled 2019-09-21: qty 1

## 2019-09-21 MED ORDER — SODIUM CHLORIDE 0.9 % IV SOLN
1.0000 g | INTRAVENOUS | Status: DC
  Administered 2019-09-21 – 2019-09-22 (×2): 1 g via INTRAVENOUS
  Filled 2019-09-21: qty 1
  Filled 2019-09-21: qty 10

## 2019-09-21 MED ORDER — POTASSIUM CHLORIDE CRYS ER 20 MEQ PO TBCR
20.0000 meq | EXTENDED_RELEASE_TABLET | Freq: Every day | ORAL | Status: DC
  Administered 2019-09-21 – 2019-09-22 (×2): 20 meq via ORAL
  Filled 2019-09-21 (×2): qty 1

## 2019-09-21 MED ORDER — DULOXETINE HCL 30 MG PO CPEP
30.0000 mg | ORAL_CAPSULE | Freq: Every day | ORAL | Status: DC
  Administered 2019-09-21 – 2019-09-22 (×2): 30 mg via ORAL
  Filled 2019-09-21 (×2): qty 1

## 2019-09-21 MED ORDER — OXYBUTYNIN CHLORIDE ER 5 MG PO TB24
5.0000 mg | ORAL_TABLET | Freq: Every day | ORAL | Status: DC
  Administered 2019-09-21 – 2019-09-22 (×2): 5 mg via ORAL
  Filled 2019-09-21 (×2): qty 1

## 2019-09-21 MED ORDER — MONTELUKAST SODIUM 10 MG PO TABS
10.0000 mg | ORAL_TABLET | Freq: Every day | ORAL | Status: DC
  Administered 2019-09-21 – 2019-09-22 (×2): 10 mg via ORAL
  Filled 2019-09-21 (×2): qty 1

## 2019-09-21 MED ORDER — HEPARIN SODIUM (PORCINE) 5000 UNIT/ML IJ SOLN: 5000.0000 [IU] | Freq: Three times a day (TID) | INTRAMUSCULAR | Status: DC

## 2019-09-21 NOTE — Consult Note (Addendum)
Cardiology Consultation:   Patient ID: Melinda Hartman MRN: [id number]; DOB: 1940-05-20  Admit date: 09/20/2019 Date of Consult: 09/21/2019  Primary Care Provider: [name], NP [facility] Cardiologist: No primary care provider on file.  remote Dr. Roselie Awk[facility] Electrophysiologist:  None    Patient Profile:   Melinda Hartman is a 79 y.o. female with a hx of chronic chest pain, HTN, HLD  Asthma, anxiety, depression, COPD, GERD with Nissen fundoplication 4315, CVA and multiple TIAs who is being seen today for the evaluation of syncope at the request of Dr. Cyndia Skeeters.  History of Present Illness:   Ms. Melinda Hartman with above hx and last seen by Dr Geraldo Pitter in 2016 with hx of chronic chest pain and neg stress tests 2010, 2014 and 2016.    She presented last evening with syncope -was walking to meed someone and suddenly had loss of consciousness.  No lightheadedness, no awareness of rapid HR.  No seizure activity no loss of urine or stool.      She had contusion on her fact CT head, Cspine and maxillofacial with soft tissue swelling only.    EKG:  The EKG was personally reviewed and demonstrates:  SR borderline Lt axis deviation no old to compare  Telemetry:  Telemetry was personally reviewed and demonstrates:  SR though at 23:14 pm had 2 sec pause   BP on arrival 159/91 P 82 afebrile.  No orthostatic BPs done that I can find.   Na 142 today K+ today 3.3 was normal on admit. Cr 0.62, LFTs normal WBC 7.6 Hgb 12.5, Hct 96 plts 284  U/a with large leukocytes and neg nitrites  PCXR :IMPRESSION: 1. No acute cardiopulmonary disease. 2. Linear lung base opacities consistent with atelectasis. No convincing pneumonia or pulmonary edema. 3. Questionable disruption of interconnecting rods along the thoracolumbar fusion.  Currently no chest pain and no SOB.  Has not felt lightheaded at home. Has been under increased stress taking care of sisters.  Her husband is with her for exam.  He  recently had PPM at [facility].   She is on no rate lowering meds.   Her BP is elevated.   She was told years ago she had MVP.  Past Medical History:  Diagnosis Date  . Acid reflux   . Anxiety   . Arthritis   . Asthma   . Cataracts, bilateral   . COPD (chronic obstructive pulmonary disease) ([place])   . H/O bladder problems   . Hypertension   . IBS (irritable bowel syndrome)   . Osteoporosis   . Pneumonia     Past Surgical History:  Procedure Laterality Date  . ABDOMINAL HYSTERECTOMY  1977  . APPENDECTOMY    . BACK SURGERY     x4 22 screws 2 rods  . COLONOSCOPY  04/11/2014   Mild diverticulosis. Otherwise normal colonoscopy. Highly redundant colon.   Marland Kitchen GALLBLADDER SURGERY  02/26/2017  . HERNIA REPAIR  02/26/2017  . KNEE ARTHROSCOPY Left      Home Medications:  Prior to Admission medications   Medication Sig Start Date End Date Taking? Authorizing Provider  acetaminophen (TYLENOL) 325 MG tablet Take 325 mg by mouth every 6 (six) hours as needed for moderate pain.  10/24/15  Yes [provider]  aspirin EC 81 MG tablet Take 81 mg by mouth daily.    Yes [provider]  celecoxib (CELEBREX) 100 MG capsule Take 100 mg by mouth 2 (two) times daily.   Yes [provider]  cholecalciferol (VITAMIN D3) 25 MCG (1000 UNIT) tablet Take 1,000 Units by mouth daily.   Yes [provider]  clonazePAM (KLONOPIN) 0.5 MG tablet Take 1 mg by mouth 2 (two) times daily.  05/10/16  Yes [provider]  conjugated estrogens (PREMARIN) vaginal cream Place 1 Applicatorful vaginally See admin instructions. 3 times a week 02/06/16  Yes [provider]  DULoxetine (CYMBALTA) 30 MG capsule Take 30 mg by mouth daily. 09/04/19  Yes [provider]  EPINEPHrine (EPIPEN 2-PAK) 0.3 mg/0.3 mL IJ SOAJ injection Inject 0.3 mg into the muscle as needed for anaphylaxis.  04/17/13  Yes [provider]  esomeprazole (NEXIUM) 40 MG capsule Take 40 mg by  mouth 2 (two) times daily. 09/01/19  Yes [provider]  ezetimibe (ZETIA) 10 MG tablet Take 10 mg by mouth daily.   Yes [provider]  gabapentin (NEURONTIN) 300 MG capsule Take 600 mg by mouth 2 (two) times daily as needed (sometimes 3 times daily).   Yes [provider]  MICARDIS 40 MG tablet Take 40 mg by mouth daily.  04/30/16  Yes [provider]  montelukast (SINGULAIR) 10 MG tablet Take 10 mg by mouth daily.  04/26/07  Yes [provider]  nitroGLYCERIN (NITROSTAT) 0.4 MG SL tablet Place 0.4 mg under the tongue every 5 (five) minutes as needed for chest pain.    Yes [provider]  oxybutynin (DITROPAN-XL) 5 MG 24 hr tablet Take 5 mg by mouth daily. 06/27/19  Yes [provider]  polyethylene glycol (MIRALAX / GLYCOLAX) packet Take 17 g by mouth daily as needed for mild constipation.  04/26/07  Yes [provider]  potassium chloride SA (K-DUR,KLOR-CON) 20 MEQ tablet Take 20 mEq by mouth daily.    Yes [provider]  traMADol (ULTRAM) 50 MG tablet Take 50 mg by mouth every 12 (twelve) hours as needed for moderate pain.    Yes [provider]  cholestyramine (QUESTRAN) 4 g packet Take once daily 2 hours before or after all other medications. Patient not taking: Reported on 09/20/2019 10/10/18   [name], MD  fluticasone (CUTIVATE) 0.05 % cream Apply topically 2 (two) times daily. Patient not taking: Reported on 09/20/2019 08/12/17   [name], MD    Inpatient Medications: Scheduled Meds: . cholecalciferol  1,000 Units Oral Daily  . clonazePAM  1 mg Oral BID  . DULoxetine  30 mg Oral Daily  . ezetimibe  10 mg Oral Daily  . irbesartan  150 mg Oral Daily  . montelukast  10 mg Oral Daily  . oxybutynin  5 mg Oral Daily  . pantoprazole  40 mg Oral Daily  . potassium chloride SA  20 mEq Oral Daily   Continuous Infusions: . cefTRIAXone (ROCEPHIN)  IV Stopped (09/21/19 0341)   PRN Meds: gabapentin,  nitroGLYCERIN, ondansetron **OR** ondansetron (ZOFRAN) IV  Allergies:    Allergies  Allergen Reactions  . Caffeine Shortness Of Breath  . Haloperidol Shortness Of Breath  . Food     Allergy to seafood  . Nicotine Other (See Comments)    asthma  . Tape     Said it as a disappearing tape but not sure    Social History:   Social History   Socioeconomic History  . Marital status: Married    Spouse name: Not on file  . Number of children: 2  . Years of education: Not on file  . Highest education level: Not on  file  Occupational History  . Occupation: Retired  Tobacco Use  . Smoking status: Never Smoker  . Smokeless tobacco: Never Used  Vaping Use  . Vaping Use: Never used  Substance and Sexual Activity  . Alcohol use: No  . Drug use: No  . Sexual activity: Not on file  Other Topics Concern  . Not on file  Social History Narrative  . Not on file   Social Determinants of Health   Financial Resource Strain:   . Difficulty of Paying Living Expenses: Not on file  Food Insecurity:   . Worried About Charity fundraiser in the Last Year: Not on file  . Ran Out of Food in the Last Year: Not on file  Transportation Needs:   . Lack of Transportation (Medical): Not on file  . Lack of Transportation (Non-Medical): Not on file  Physical Activity:   . Days of Exercise per Week: Not on file  . Minutes of Exercise per Session: Not on file  Stress:   . Feeling of Stress : Not on file  Social Connections:   . Frequency of Communication with Friends and Family: Not on file  . Frequency of Social Gatherings with Friends and Family: Not on file  . Attends Religious Services: Not on file  . Active Member of Clubs or Organizations: Not on file  . Attends Archivist Meetings: Not on file  . Marital Status: Not on file  Intimate Partner Violence:   . Fear of Current or Ex-Partner: Not on file  . Emotionally Abused: Not on file  . Physically Abused: Not on file  . Sexually  Abused: Not on file    Family History:    Family History  Problem Relation Age of Onset  . Stomach cancer Mother   . Colon cancer Neg Hx   . Esophageal cancer Neg Hx      ROS:  Please see the history of present illness.  General:no colds or fevers, no weight changes Skin:no rashes or ulcers HEENT:no blurred vision, no congestion CV:see HPI PUL:see HPI GI:no diarrhea constipation or melena, no indigestion GU:no hematuria, no dysuria, freg UTIs MS:no joint pain, no claudication Neuro:+ syncope, no lightheadedness, hx of CVA Endo:no diabetes, no thyroid disease  All other ROS reviewed and negative.     Physical Exam/Data:   Vitals:   09/21/19 0100 09/21/19 0115 09/21/19 0202 09/21/19 0330  BP: (!) 186/74  138/81 (!) 150/98  Pulse: 81 65 61 87  Resp: 20 19 17 14   Temp:      TempSrc:      SpO2: 95% 92% 94% 92%    Intake/Output Summary (Last 24 hours) at 09/21/2019 0801 Last data filed at 09/21/2019 0341 Gross per 24 hour  Intake 100 ml  Output --  Net 100 ml   Last 3 Weights 10/10/2018 03/06/2018 01/30/2018  Weight (lbs) 182 lb 178 lb 4 oz 175 lb  Weight (kg) 82.555 kg 80.854 kg 79.379 kg     There is no height or weight on file to calculate BMI.  General:  Well nourished, well developed, in no acute distress, face with bruising HEENT: normal, bruising to face  Lymph: no adenopathy Neck: no JVD Endocrine:  No thryomegaly Vascular: No carotid bruits; pedal pulses 1+ bilaterally without bruits  Cardiac:  normal S1, S2; RRR; no murmur gallup rub or click Lungs:  clear to auscultation bilaterally, no wheezing, rhonchi or rales  Abd: soft, nontender, no hepatomegaly  Ext: no edema Musculoskeletal:  No deformities, BUE and BLE strength normal and equal Skin: warm and dry lacerations Lt hand  Neuro:  Alert and oriented X 3 MAE, follows commands, no focal abnormalities noted Psych:  Normal affect    Relevant CV Studies: Echo ordered  Laboratory Data:  High  Sensitivity Troponin:  No results for input(s): TROPONINIHS in the last 720 hours.   Chemistry Recent Labs  Lab 09/20/19 1720 09/21/19 0432  NA 139 142  K 3.7 3.3*  CL 104 107  CO2 26 27  GLUCOSE 99 105*  BUN 16 14  CREATININE 0.77 0.62  CALCIUM 9.9 8.7*  GFRNONAA >60 >60  GFRAA >60 >60  ANIONGAP 9 8    Recent Labs  Lab 09/20/19 1720  PROT 6.9  ALBUMIN 4.2  AST 17  ALT 18  ALKPHOS 102  BILITOT 0.4   Hematology Recent Labs  Lab 09/20/19 1720 09/21/19 0432  WBC 9.4 7.6  RBC 4.40 4.03  HGB 13.6 12.5  HCT 42.4 38.7  MCV 96.4 96.0  MCH 30.9 31.0  MCHC 32.1 32.3  RDW 13.3 13.2  PLT 329 284   BNPNo results for input(s): BNP, PROBNP in the last 168 hours.  DDimer No results for input(s): DDIMER in the last 168 hours.   Radiology/Studies:  CT Head Wo Contrast  Result Date: 09/20/2019 CLINICAL DATA:  Fall and syncopal episode, left cheek pain and infraorbital bruising EXAM: CT HEAD WITHOUT CONTRAST CT MAXILLOFACIAL WITHOUT CONTRAST CT CERVICAL SPINE WITHOUT CONTRAST TECHNIQUE: Multidetector CT imaging of the head, cervical spine, and maxillofacial structures were performed using the standard protocol without intravenous contrast. Multiplanar CT image reconstructions of the cervical spine and maxillofacial structures were also generated. COMPARISON:  MRI 06/17/2017, CT head and cervical spine 06/17/2017 FINDINGS: CT HEAD FINDINGS Brain: No evidence of acute infarction, hemorrhage, hydrocephalus, extra-axial collection or mass lesion/mass effect. Basal cisterns are patent. Midline intracranial structures are normal. Cerebellar tonsils are normally positioned. Symmetric prominence of the ventricles, cisterns and sulci compatible with parenchymal volume loss. Patchy areas of white matter hypoattenuation are most compatible with mild-to-moderate chronic microvascular angiopathy. Benign dural calcifications are similar to priors. Vascular: Atherosclerotic calcification of the carotid  siphons. No hyperdense vessel. Skull: Minimal left frontal scalp swelling without large hematoma or calvarial fracture. No other acute or suspicious osseous abnormality Other: None. CT MAXILLOFACIAL FINDINGS Osseous: No fracture of the bony orbits. Nasal bones are intact. No other mid face fractures are seen. The pterygoid plates are intact. No visible or suspected temporal bone fractures. Temporomandibular joints are normally aligned. The mandible is intact. No fractured or avulsed teeth. Orbits: Left infraorbital soft tissue swelling. No retro septal gas, stranding or hemorrhage. Prior bilateral lens extractions. Globes appear otherwise normal and symmetric. Symmetric appearance of the extraocular musculature and optic nerve sheath complexes. Normal caliber of the superior ophthalmic veins. Sinuses: Paranasal sinuses and mastoid air cells are predominantly clear. Middle ear cavities are clear. Ossicular chains are normally configured. Soft tissues: Left infraorbital and malar soft tissue swelling without soft tissue gas or foreign body. Remote soft tissue thickening and radiodensity in the subcutaneous tissues of the superior nasal bridge unchanged from prior and may reflect some chronic scarring. No other significant soft tissue abnormalities. CT CERVICAL SPINE FINDINGS Alignment: Stabilization collar is in place at the time of examination. There is mild straightening of the normal cervical lordosis which is slightly more pronounced than on the comparison imaging in the lower cervical spine possibly related to positioning, stabilization or potential muscle spasm.  Stepwise retrolisthesis C5-C7 and anterolisthesis C7 on T1 are grossly unchanged from comparison and favored to be on a degenerative, spondylitic basis. No evidence of traumatic listhesis. No abnormally widened, perched or jumped facets. Normal alignment of the craniocervical and atlantoaxial articulations. Skull base and vertebrae: No acute skull base  fracture. No vertebral body fracture or height loss. Normal bone mineralization. No worrisome osseous lesions. Moderate atlantodental arthrosis with periarticular spurring and pseudoarticulation with the basion is unchanged from comparison. Multilevel spondylitic changes are better detailed below. Soft tissues and spinal canal: No pre or paravertebral fluid or swelling. No visible canal hematoma. Cervical carotid atherosclerosis with a slightly medialized course of the right internal carotid artery. Disc levels: Multilevel intervertebral disc height loss with spondylitic endplate changes. Slightly larger disc osteophyte complexes are present C4-C7 with at most mild canal stenosis at the C6-7 level. Multilevel uncinate spurring and facet hypertrophic changes are present as well with mild multilevel neural foraminal narrowing and more moderate bilateral foraminal impingement at C5-6 and C6-7. Upper chest: Stable calcified granuloma seen in the right lung apex. No acute abnormality in the upper chest or imaged lung apices. Some mild calcification in the visible proximal great vessels. Other: Normal thyroid gland. IMPRESSION: 1. Minimal left frontal scalp swelling without large hematoma or calvarial fracture. No acute intracranial abnormality. 2. Stable mild-to-moderate parenchymal volume loss and chronic microvascular ischemic white matter disease. 3. Left infraorbital and malar soft tissue swelling without soft tissue gas or foreign body. 4. No acute facial bone fracture or orbital injury identified. 5. No acute cervical spine fracture or traumatic listhesis. 6. Multilevel degenerative changes of the cervical spine as described above. 7. Cervical and intracranial atherosclerosis. Electronically Signed   By: Lovena Le M.D.   On: 09/20/2019 18:36   CT Cervical Spine Wo Contrast  Result Date: 09/20/2019 CLINICAL DATA:  Fall and syncopal episode, left cheek pain and infraorbital bruising EXAM: CT HEAD WITHOUT  CONTRAST CT MAXILLOFACIAL WITHOUT CONTRAST CT CERVICAL SPINE WITHOUT CONTRAST TECHNIQUE: Multidetector CT imaging of the head, cervical spine, and maxillofacial structures were performed using the standard protocol without intravenous contrast. Multiplanar CT image reconstructions of the cervical spine and maxillofacial structures were also generated. COMPARISON:  MRI 06/17/2017, CT head and cervical spine 06/17/2017 FINDINGS: CT HEAD FINDINGS Brain: No evidence of acute infarction, hemorrhage, hydrocephalus, extra-axial collection or mass lesion/mass effect. Basal cisterns are patent. Midline intracranial structures are normal. Cerebellar tonsils are normally positioned. Symmetric prominence of the ventricles, cisterns and sulci compatible with parenchymal volume loss. Patchy areas of white matter hypoattenuation are most compatible with mild-to-moderate chronic microvascular angiopathy. Benign dural calcifications are similar to priors. Vascular: Atherosclerotic calcification of the carotid siphons. No hyperdense vessel. Skull: Minimal left frontal scalp swelling without large hematoma or calvarial fracture. No other acute or suspicious osseous abnormality Other: None. CT MAXILLOFACIAL FINDINGS Osseous: No fracture of the bony orbits. Nasal bones are intact. No other mid face fractures are seen. The pterygoid plates are intact. No visible or suspected temporal bone fractures. Temporomandibular joints are normally aligned. The mandible is intact. No fractured or avulsed teeth. Orbits: Left infraorbital soft tissue swelling. No retro septal gas, stranding or hemorrhage. Prior bilateral lens extractions. Globes appear otherwise normal and symmetric. Symmetric appearance of the extraocular musculature and optic nerve sheath complexes. Normal caliber of the superior ophthalmic veins. Sinuses: Paranasal sinuses and mastoid air cells are predominantly clear. Middle ear cavities are clear. Ossicular chains are normally  configured. Soft tissues: Left infraorbital and malar soft  tissue swelling without soft tissue gas or foreign body. Remote soft tissue thickening and radiodensity in the subcutaneous tissues of the superior nasal bridge unchanged from prior and may reflect some chronic scarring. No other significant soft tissue abnormalities. CT CERVICAL SPINE FINDINGS Alignment: Stabilization collar is in place at the time of examination. There is mild straightening of the normal cervical lordosis which is slightly more pronounced than on the comparison imaging in the lower cervical spine possibly related to positioning, stabilization or potential muscle spasm. Stepwise retrolisthesis C5-C7 and anterolisthesis C7 on T1 are grossly unchanged from comparison and favored to be on a degenerative, spondylitic basis. No evidence of traumatic listhesis. No abnormally widened, perched or jumped facets. Normal alignment of the craniocervical and atlantoaxial articulations. Skull base and vertebrae: No acute skull base fracture. No vertebral body fracture or height loss. Normal bone mineralization. No worrisome osseous lesions. Moderate atlantodental arthrosis with periarticular spurring and pseudoarticulation with the basion is unchanged from comparison. Multilevel spondylitic changes are better detailed below. Soft tissues and spinal canal: No pre or paravertebral fluid or swelling. No visible canal hematoma. Cervical carotid atherosclerosis with a slightly medialized course of the right internal carotid artery. Disc levels: Multilevel intervertebral disc height loss with spondylitic endplate changes. Slightly larger disc osteophyte complexes are present C4-C7 with at most mild canal stenosis at the C6-7 level. Multilevel uncinate spurring and facet hypertrophic changes are present as well with mild multilevel neural foraminal narrowing and more moderate bilateral foraminal impingement at C5-6 and C6-7. Upper chest: Stable calcified  granuloma seen in the right lung apex. No acute abnormality in the upper chest or imaged lung apices. Some mild calcification in the visible proximal great vessels. Other: Normal thyroid gland. IMPRESSION: 1. Minimal left frontal scalp swelling without large hematoma or calvarial fracture. No acute intracranial abnormality. 2. Stable mild-to-moderate parenchymal volume loss and chronic microvascular ischemic white matter disease. 3. Left infraorbital and malar soft tissue swelling without soft tissue gas or foreign body. 4. No acute facial bone fracture or orbital injury identified. 5. No acute cervical spine fracture or traumatic listhesis. 6. Multilevel degenerative changes of the cervical spine as described above. 7. Cervical and intracranial atherosclerosis. Electronically Signed   By: Lovena Le M.D.   On: 09/20/2019 18:36   DG Chest Port 1 View  Result Date: 09/20/2019 CLINICAL DATA:  Pt states she fell while visiting friend. Per notes- syncopal episode-complaining of left knee pain EXAM: PORTABLE CHEST 1 VIEW COMPARISON:  06/17/2017. FINDINGS: Cardiac silhouette is normal in size. No mediastinal or hilar masses. Linear opacities are noted at both lung bases consistent with atelectasis, similar to the prior chest radiograph and accentuated by low lung volumes. Remainder of the lungs is clear. No convincing pleural effusion and no pneumothorax. Spine fusion/stabilization hardware with pedicle screws and interconnecting rods. Interconnecting rods appear disrupted or unconnected, on the right at the L1-L2 level on the on the left closer to the L2-L3 level. IMPRESSION: 1. No acute cardiopulmonary disease. 2. Linear lung base opacities consistent with atelectasis. No convincing pneumonia or pulmonary edema. 3. Questionable disruption of interconnecting rods along the thoracolumbar fusion. Electronically Signed   By: Lajean Manes M.D.   On: 09/20/2019 18:18   DG Knee Complete 4 Views Left  Result Date:  09/20/2019 CLINICAL DATA:  Pt states she fell while visiting friend. Per notes- syncopal episode-complaining of left knee pain EXAM: LEFT KNEE - COMPLETE 4+ VIEW COMPARISON:  05/18/2016 FINDINGS: No fracture or bone lesion. Moderate medial  joint space compartment narrowing associated marginal osteophytes. Small marginal osteophytes from patella. Lateral compartment is relatively well preserved. No joint effusion. Surrounding soft tissues are unremarkable. IMPRESSION: 1. No fracture or acute finding. 2. Moderate osteoarthritis. Electronically Signed   By: Lajean Manes M.D.   On: 09/20/2019 18:14   CT MAXILLOFACIAL WO CONTRAST  Result Date: 09/20/2019 CLINICAL DATA:  Fall and syncopal episode, left cheek pain and infraorbital bruising EXAM: CT HEAD WITHOUT CONTRAST CT MAXILLOFACIAL WITHOUT CONTRAST CT CERVICAL SPINE WITHOUT CONTRAST TECHNIQUE: Multidetector CT imaging of the head, cervical spine, and maxillofacial structures were performed using the standard protocol without intravenous contrast. Multiplanar CT image reconstructions of the cervical spine and maxillofacial structures were also generated. COMPARISON:  MRI 06/17/2017, CT head and cervical spine 06/17/2017 FINDINGS: CT HEAD FINDINGS Brain: No evidence of acute infarction, hemorrhage, hydrocephalus, extra-axial collection or mass lesion/mass effect. Basal cisterns are patent. Midline intracranial structures are normal. Cerebellar tonsils are normally positioned. Symmetric prominence of the ventricles, cisterns and sulci compatible with parenchymal volume loss. Patchy areas of white matter hypoattenuation are most compatible with mild-to-moderate chronic microvascular angiopathy. Benign dural calcifications are similar to priors. Vascular: Atherosclerotic calcification of the carotid siphons. No hyperdense vessel. Skull: Minimal left frontal scalp swelling without large hematoma or calvarial fracture. No other acute or suspicious osseous abnormality  Other: None. CT MAXILLOFACIAL FINDINGS Osseous: No fracture of the bony orbits. Nasal bones are intact. No other mid face fractures are seen. The pterygoid plates are intact. No visible or suspected temporal bone fractures. Temporomandibular joints are normally aligned. The mandible is intact. No fractured or avulsed teeth. Orbits: Left infraorbital soft tissue swelling. No retro septal gas, stranding or hemorrhage. Prior bilateral lens extractions. Globes appear otherwise normal and symmetric. Symmetric appearance of the extraocular musculature and optic nerve sheath complexes. Normal caliber of the superior ophthalmic veins. Sinuses: Paranasal sinuses and mastoid air cells are predominantly clear. Middle ear cavities are clear. Ossicular chains are normally configured. Soft tissues: Left infraorbital and malar soft tissue swelling without soft tissue gas or foreign body. Remote soft tissue thickening and radiodensity in the subcutaneous tissues of the superior nasal bridge unchanged from prior and may reflect some chronic scarring. No other significant soft tissue abnormalities. CT CERVICAL SPINE FINDINGS Alignment: Stabilization collar is in place at the time of examination. There is mild straightening of the normal cervical lordosis which is slightly more pronounced than on the comparison imaging in the lower cervical spine possibly related to positioning, stabilization or potential muscle spasm. Stepwise retrolisthesis C5-C7 and anterolisthesis C7 on T1 are grossly unchanged from comparison and favored to be on a degenerative, spondylitic basis. No evidence of traumatic listhesis. No abnormally widened, perched or jumped facets. Normal alignment of the craniocervical and atlantoaxial articulations. Skull base and vertebrae: No acute skull base fracture. No vertebral body fracture or height loss. Normal bone mineralization. No worrisome osseous lesions. Moderate atlantodental arthrosis with periarticular  spurring and pseudoarticulation with the basion is unchanged from comparison. Multilevel spondylitic changes are better detailed below. Soft tissues and spinal canal: No pre or paravertebral fluid or swelling. No visible canal hematoma. Cervical carotid atherosclerosis with a slightly medialized course of the right internal carotid artery. Disc levels: Multilevel intervertebral disc height loss with spondylitic endplate changes. Slightly larger disc osteophyte complexes are present C4-C7 with at most mild canal stenosis at the C6-7 level. Multilevel uncinate spurring and facet hypertrophic changes are present as well with mild multilevel neural foraminal narrowing and more moderate bilateral  foraminal impingement at C5-6 and C6-7. Upper chest: Stable calcified granuloma seen in the right lung apex. No acute abnormality in the upper chest or imaged lung apices. Some mild calcification in the visible proximal great vessels. Other: Normal thyroid gland. IMPRESSION: 1. Minimal left frontal scalp swelling without large hematoma or calvarial fracture. No acute intracranial abnormality. 2. Stable mild-to-moderate parenchymal volume loss and chronic microvascular ischemic white matter disease. 3. Left infraorbital and malar soft tissue swelling without soft tissue gas or foreign body. 4. No acute facial bone fracture or orbital injury identified. 5. No acute cervical spine fracture or traumatic listhesis. 6. Multilevel degenerative changes of the cervical spine as described above. 7. Cervical and intracranial atherosclerosis. Electronically Signed   By: Lovena Le M.D.   On: 09/20/2019 18:36        No chest pain    Assessment and Plan:   1. Syncope - no arrhythmias in ER- EMS strips with SR 78 to 80s, their EKG with inverted T wave in III -have ordered orthostatic BP.  Would need 30 day event monitor-Thought for linq.  Electrolytes on admit normal.  Will check TSH as well.  Echo is ordered.  Hx of being told she  had MVP (one 2 second pause on tele 2. No angina, hx of neg stress tests last one 2016.  3. HTN has been elevated   But has not rec'd meds 4. Recurrent UTIs 5. HLD on zetia      For questions or updates, please contact [place] Please consult [email or web address] for contact info under    Signed, Cecilie Kicks, NP  09/21/2019 8:01 AM   History and all data above reviewed.  Patient examined.  I agree with the findings as above.  The patient is pleasant and she has no significant past history.  She was walking to go visit her sister and the next thing she knows she was hitting the floor.  She was apparently behind her companions and not seen.  She remembers hitting the floor and does not think that she was ever unconscious.  She has not had a history of syncope but has had a history of falls.   She has otherwise been doing OK but under much stress.  The patient denies any new symptoms such as chest discomfort, neck or arm discomfort. There has been no new shortness of breath, PND or orthopnea. There have been no reported palpitations, presyncope or syncope.  She walks with a cane because of back problems and does "stumble" because of this.   The patient exam reveals COR:RRR, no obvious MVP click or murmurs  ,  Lungs: clear  ,  Abd: Positive bowel sounds, no rebound no guarding, Ext No edema  .  All available labs, radiology testing, previous records reviewed. Agree with documented assessment and plan.   Questionable syncope:  It is not clear that she had LOC.  She did have trauma.  She had some bradycardia overnight on tele but nothing that should cause syncope.  She has mild conduction delay on the EKG but no high risk findings.  She does not have orthostatic symptoms but I agree with checking orthostatics.   I also would plan a 4 week event monitor and follow up with me after this is available.    Jeneen Rinks [facility]  10:25 AM  09/21/2019

## 2019-09-21 NOTE — Progress Notes (Signed)
°  Echocardiogram 2D Echocardiogram has been performed.  Melinda Hartman 09/21/2019, 4:04 PM

## 2019-09-21 NOTE — Discharge Instructions (Signed)
Cardiology office will arrange for you to wear a monitor for 30 days.  It will come in the mail with instructions.

## 2019-09-21 NOTE — H&P (Signed)
History and Physical    Melinda Hartman MOQ:[id number] DOB: [birth year] DOA: 09/20/2019  PCP: [name], NP  Patient coming from: Home.  Chief Complaint: Loss of consciousness.  HPI: Melinda Hartman is a 79 y.o. female with history of hypertension, hyperlipidemia, asthma, anxiety and depression was brought to the ER after patient had a syncopal episode.  Patient states she was walking to meet someone in the independent living facility when she suddenly lost consciousness with no prodrome.  Did not have any chest pain palpitations shortness of breath dizziness prior to or after the fall.  She fell forward and hit her face.  She did not lose consciousness but regained soon.  Did not have any seizure-like activities or incontinence of urine.  Patient has been having recurrent UTI and has been on Keflex for last 1 month.  ED Course: In the ER patient had some contusions on the face.  CT head, C-spine and CT maxillofacial were done which were showing some soft tissue swelling EKG was showing normal sinus rhythm with QTC of 437 ms.  Labs were largely unremarkable.  UA is concerning for UTI again.  Patient admitted for syncope.  Review of Systems: As per HPI, rest all negative.   Past Medical History:  Diagnosis Date  . Acid reflux   . Anxiety   . Arthritis   . Asthma   . Cataracts, bilateral   . COPD (chronic obstructive pulmonary disease) ([place])   . H/O bladder problems   . Hypertension   . IBS (irritable bowel syndrome)   . Osteoporosis   . Pneumonia     Past Surgical History:  Procedure Laterality Date  . ABDOMINAL HYSTERECTOMY  1977  . APPENDECTOMY    . BACK SURGERY     x4 22 screws 2 rods  . COLONOSCOPY  04/11/2014   Mild diverticulosis. Otherwise normal colonoscopy. Highly redundant colon.   Marland Kitchen GALLBLADDER SURGERY  02/26/2017  . HERNIA REPAIR  02/26/2017  . KNEE ARTHROSCOPY Left      reports that she has never smoked. She has never used smokeless tobacco. She reports that she  does not drink alcohol and does not use drugs.  Allergies  Allergen Reactions  . Caffeine Shortness Of Breath  . Haloperidol Shortness Of Breath  . Food     Allergy to seafood  . Nicotine Other (See Comments)    asthma  . Tape     Said it as a disappearing tape but not sure    Family History  Problem Relation Age of Onset  . Stomach cancer Mother   . Colon cancer Neg Hx   . Esophageal cancer Neg Hx     Prior to Admission medications   Medication Sig Start Date End Date Taking? Authorizing Provider  acetaminophen (TYLENOL) 325 MG tablet Take 325 mg by mouth every 6 (six) hours as needed for moderate pain.  10/24/15  Yes [provider]  aspirin EC 81 MG tablet Take 81 mg by mouth daily.    Yes [provider]  celecoxib (CELEBREX) 100 MG capsule Take 100 mg by mouth 2 (two) times daily.   Yes [provider]  cholecalciferol (VITAMIN D3) 25 MCG (1000 UNIT) tablet Take 1,000 Units by mouth daily.   Yes [provider]  clonazePAM (KLONOPIN) 0.5 MG tablet Take 1 mg by mouth 2 (two) times daily.  05/10/16  Yes [provider]  conjugated estrogens (PREMARIN) vaginal cream Place 1 Applicatorful vaginally See admin instructions. 3 times  a week 02/06/16  Yes [provider]  DULoxetine (CYMBALTA) 30 MG capsule Take 30 mg by mouth daily. 09/04/19  Yes [provider]  EPINEPHrine (EPIPEN 2-PAK) 0.3 mg/0.3 mL IJ SOAJ injection Inject 0.3 mg into the muscle as needed for anaphylaxis.  04/17/13  Yes [provider]  esomeprazole (NEXIUM) 40 MG capsule Take 40 mg by mouth 2 (two) times daily. 09/01/19  Yes [provider]  ezetimibe (ZETIA) 10 MG tablet Take 10 mg by mouth daily.   Yes [provider]  gabapentin (NEURONTIN) 300 MG capsule Take 600 mg by mouth 2 (two) times daily as needed (sometimes 3 times daily).   Yes [provider]  MICARDIS 40 MG tablet Take 40 mg by mouth daily.  04/30/16  Yes  [provider]  montelukast (SINGULAIR) 10 MG tablet Take 10 mg by mouth daily.  04/26/07  Yes [provider]  nitroGLYCERIN (NITROSTAT) 0.4 MG SL tablet Place 0.4 mg under the tongue every 5 (five) minutes as needed for chest pain.    Yes [provider]  oxybutynin (DITROPAN-XL) 5 MG 24 hr tablet Take 5 mg by mouth daily. 06/27/19  Yes [provider]  polyethylene glycol (MIRALAX / GLYCOLAX) packet Take 17 g by mouth daily as needed for mild constipation.  04/26/07  Yes [provider]  potassium chloride SA (K-DUR,KLOR-CON) 20 MEQ tablet Take 20 mEq by mouth daily.    Yes [provider]  traMADol (ULTRAM) 50 MG tablet Take 50 mg by mouth every 12 (twelve) hours as needed for moderate pain.    Yes [provider]  cholestyramine (QUESTRAN) 4 g packet Take once daily 2 hours before or after all other medications. Patient not taking: Reported on 09/20/2019 10/10/18   [name], MD  fluticasone (CUTIVATE) 0.05 % cream Apply topically 2 (two) times daily. Patient not taking: Reported on 09/20/2019 08/12/17   [name], MD    Physical Exam: Constitutional: Moderately built and nourished. Vitals:   09/20/19 2230 09/20/19 2300 09/20/19 2315 09/20/19 2345  BP: (!) 162/82 (!) 159/84 135/75 (!) 180/159  Pulse: 85 70 (!) 59 73  Resp: (!) 24 16 14 19   Temp:      TempSrc:      SpO2: 98% 96% 96% 96%   Eyes: Anicteric no pallor. ENMT: No discharge from the ears eyes or mouth. Neck: No mass felt.  No neck rigidity. Respiratory: No rhonchi or crepitations. Cardiovascular: S1-S2 heard. Abdomen: Soft nontender bowel sounds present. Musculoskeletal: No edema. Skin: Mild ecchymotic areas on the face. Neurologic: Alert awake oriented to time place and person.  Moves all extremities. Psychiatric: Appears normal.  Normal affect.   Labs on Admission: I have personally reviewed following labs and imaging studies  CBC: Recent Labs  Lab  09/20/19 1720  WBC 9.4  HGB 13.6  HCT 42.4  MCV 96.4  PLT 518   Basic Metabolic Panel: Recent Labs  Lab 09/20/19 1720  NA 139  K 3.7  CL 104  CO2 26  GLUCOSE 99  BUN 16  CREATININE 0.77  CALCIUM 9.9   GFR: CrCl cannot be calculated (Unknown ideal weight.). Liver Function Tests: Recent Labs  Lab 09/20/19 1720  AST 17  ALT 18  ALKPHOS 102  BILITOT 0.4  PROT 6.9  ALBUMIN 4.2   No results for input(s): LIPASE, AMYLASE in the last 168 hours. No results for input(s): AMMONIA in the last 168 hours. Coagulation Profile: No results for input(s): INR,  PROTIME in the last 168 hours. Cardiac Enzymes: No results for input(s): CKTOTAL, CKMB, CKMBINDEX, TROPONINI in the last 168 hours. BNP (last 3 results) No results for input(s): PROBNP in the last 8760 hours. HbA1C: No results for input(s): HGBA1C in the last 72 hours. CBG: No results for input(s): GLUCAP in the last 168 hours. Lipid Profile: No results for input(s): CHOL, HDL, LDLCALC, TRIG, CHOLHDL, LDLDIRECT in the last 72 hours. Thyroid Function Tests: No results for input(s): TSH, T4TOTAL, FREET4, T3FREE, THYROIDAB in the last 72 hours. Anemia Panel: No results for input(s): VITAMINB12, FOLATE, FERRITIN, TIBC, IRON, RETICCTPCT in the last 72 hours. Urine analysis:    Component Value Date/Time   COLORURINE YELLOW 09/20/2019 2300   APPEARANCEUR HAZY (A) 09/20/2019 2300   LABSPEC 1.013 09/20/2019 2300   PHURINE 5.0 09/20/2019 2300   GLUCOSEU NEGATIVE 09/20/2019 2300   HGBUR SMALL (A) 09/20/2019 2300   BILIRUBINUR NEGATIVE 09/20/2019 2300   KETONESUR NEGATIVE 09/20/2019 2300   PROTEINUR 30 (A) 09/20/2019 2300   NITRITE NEGATIVE 09/20/2019 2300   LEUKOCYTESUR LARGE (A) 09/20/2019 2300   Sepsis Labs: @LABRCNTIP (procalcitonin:4,lacticidven:4) )No results found for this or any previous visit (from the past 240 hour(s)).   Radiological Exams on Admission: CT Head Wo Contrast  Result Date: 09/20/2019 CLINICAL  DATA:  Fall and syncopal episode, left cheek pain and infraorbital bruising EXAM: CT HEAD WITHOUT CONTRAST CT MAXILLOFACIAL WITHOUT CONTRAST CT CERVICAL SPINE WITHOUT CONTRAST TECHNIQUE: Multidetector CT imaging of the head, cervical spine, and maxillofacial structures were performed using the standard protocol without intravenous contrast. Multiplanar CT image reconstructions of the cervical spine and maxillofacial structures were also generated. COMPARISON:  MRI 06/17/2017, CT head and cervical spine 06/17/2017 FINDINGS: CT HEAD FINDINGS Brain: No evidence of acute infarction, hemorrhage, hydrocephalus, extra-axial collection or mass lesion/mass effect. Basal cisterns are patent. Midline intracranial structures are normal. Cerebellar tonsils are normally positioned. Symmetric prominence of the ventricles, cisterns and sulci compatible with parenchymal volume loss. Patchy areas of white matter hypoattenuation are most compatible with mild-to-moderate chronic microvascular angiopathy. Benign dural calcifications are similar to priors. Vascular: Atherosclerotic calcification of the carotid siphons. No hyperdense vessel. Skull: Minimal left frontal scalp swelling without large hematoma or calvarial fracture. No other acute or suspicious osseous abnormality Other: None. CT MAXILLOFACIAL FINDINGS Osseous: No fracture of the bony orbits. Nasal bones are intact. No other mid face fractures are seen. The pterygoid plates are intact. No visible or suspected temporal bone fractures. Temporomandibular joints are normally aligned. The mandible is intact. No fractured or avulsed teeth. Orbits: Left infraorbital soft tissue swelling. No retro septal gas, stranding or hemorrhage. Prior bilateral lens extractions. Globes appear otherwise normal and symmetric. Symmetric appearance of the extraocular musculature and optic nerve sheath complexes. Normal caliber of the superior ophthalmic veins. Sinuses: Paranasal sinuses and mastoid  air cells are predominantly clear. Middle ear cavities are clear. Ossicular chains are normally configured. Soft tissues: Left infraorbital and malar soft tissue swelling without soft tissue gas or foreign body. Remote soft tissue thickening and radiodensity in the subcutaneous tissues of the superior nasal bridge unchanged from prior and may reflect some chronic scarring. No other significant soft tissue abnormalities. CT CERVICAL SPINE FINDINGS Alignment: Stabilization collar is in place at the time of examination. There is mild straightening of the normal cervical lordosis which is slightly more pronounced than on the comparison imaging in the lower cervical spine possibly related to positioning, stabilization or potential muscle spasm. Stepwise retrolisthesis C5-C7 and anterolisthesis C7 on T1  are grossly unchanged from comparison and favored to be on a degenerative, spondylitic basis. No evidence of traumatic listhesis. No abnormally widened, perched or jumped facets. Normal alignment of the craniocervical and atlantoaxial articulations. Skull base and vertebrae: No acute skull base fracture. No vertebral body fracture or height loss. Normal bone mineralization. No worrisome osseous lesions. Moderate atlantodental arthrosis with periarticular spurring and pseudoarticulation with the basion is unchanged from comparison. Multilevel spondylitic changes are better detailed below. Soft tissues and spinal canal: No pre or paravertebral fluid or swelling. No visible canal hematoma. Cervical carotid atherosclerosis with a slightly medialized course of the right internal carotid artery. Disc levels: Multilevel intervertebral disc height loss with spondylitic endplate changes. Slightly larger disc osteophyte complexes are present C4-C7 with at most mild canal stenosis at the C6-7 level. Multilevel uncinate spurring and facet hypertrophic changes are present as well with mild multilevel neural foraminal narrowing and more  moderate bilateral foraminal impingement at C5-6 and C6-7. Upper chest: Stable calcified granuloma seen in the right lung apex. No acute abnormality in the upper chest or imaged lung apices. Some mild calcification in the visible proximal great vessels. Other: Normal thyroid gland. IMPRESSION: 1. Minimal left frontal scalp swelling without large hematoma or calvarial fracture. No acute intracranial abnormality. 2. Stable mild-to-moderate parenchymal volume loss and chronic microvascular ischemic white matter disease. 3. Left infraorbital and malar soft tissue swelling without soft tissue gas or foreign body. 4. No acute facial bone fracture or orbital injury identified. 5. No acute cervical spine fracture or traumatic listhesis. 6. Multilevel degenerative changes of the cervical spine as described above. 7. Cervical and intracranial atherosclerosis. Electronically Signed   By: Lovena Le M.D.   On: 09/20/2019 18:36   CT Cervical Spine Wo Contrast  Result Date: 09/20/2019 CLINICAL DATA:  Fall and syncopal episode, left cheek pain and infraorbital bruising EXAM: CT HEAD WITHOUT CONTRAST CT MAXILLOFACIAL WITHOUT CONTRAST CT CERVICAL SPINE WITHOUT CONTRAST TECHNIQUE: Multidetector CT imaging of the head, cervical spine, and maxillofacial structures were performed using the standard protocol without intravenous contrast. Multiplanar CT image reconstructions of the cervical spine and maxillofacial structures were also generated. COMPARISON:  MRI 06/17/2017, CT head and cervical spine 06/17/2017 FINDINGS: CT HEAD FINDINGS Brain: No evidence of acute infarction, hemorrhage, hydrocephalus, extra-axial collection or mass lesion/mass effect. Basal cisterns are patent. Midline intracranial structures are normal. Cerebellar tonsils are normally positioned. Symmetric prominence of the ventricles, cisterns and sulci compatible with parenchymal volume loss. Patchy areas of white matter hypoattenuation are most compatible with  mild-to-moderate chronic microvascular angiopathy. Benign dural calcifications are similar to priors. Vascular: Atherosclerotic calcification of the carotid siphons. No hyperdense vessel. Skull: Minimal left frontal scalp swelling without large hematoma or calvarial fracture. No other acute or suspicious osseous abnormality Other: None. CT MAXILLOFACIAL FINDINGS Osseous: No fracture of the bony orbits. Nasal bones are intact. No other mid face fractures are seen. The pterygoid plates are intact. No visible or suspected temporal bone fractures. Temporomandibular joints are normally aligned. The mandible is intact. No fractured or avulsed teeth. Orbits: Left infraorbital soft tissue swelling. No retro septal gas, stranding or hemorrhage. Prior bilateral lens extractions. Globes appear otherwise normal and symmetric. Symmetric appearance of the extraocular musculature and optic nerve sheath complexes. Normal caliber of the superior ophthalmic veins. Sinuses: Paranasal sinuses and mastoid air cells are predominantly clear. Middle ear cavities are clear. Ossicular chains are normally configured. Soft tissues: Left infraorbital and malar soft tissue swelling without soft tissue gas or foreign body.  Remote soft tissue thickening and radiodensity in the subcutaneous tissues of the superior nasal bridge unchanged from prior and may reflect some chronic scarring. No other significant soft tissue abnormalities. CT CERVICAL SPINE FINDINGS Alignment: Stabilization collar is in place at the time of examination. There is mild straightening of the normal cervical lordosis which is slightly more pronounced than on the comparison imaging in the lower cervical spine possibly related to positioning, stabilization or potential muscle spasm. Stepwise retrolisthesis C5-C7 and anterolisthesis C7 on T1 are grossly unchanged from comparison and favored to be on a degenerative, spondylitic basis. No evidence of traumatic listhesis. No  abnormally widened, perched or jumped facets. Normal alignment of the craniocervical and atlantoaxial articulations. Skull base and vertebrae: No acute skull base fracture. No vertebral body fracture or height loss. Normal bone mineralization. No worrisome osseous lesions. Moderate atlantodental arthrosis with periarticular spurring and pseudoarticulation with the basion is unchanged from comparison. Multilevel spondylitic changes are better detailed below. Soft tissues and spinal canal: No pre or paravertebral fluid or swelling. No visible canal hematoma. Cervical carotid atherosclerosis with a slightly medialized course of the right internal carotid artery. Disc levels: Multilevel intervertebral disc height loss with spondylitic endplate changes. Slightly larger disc osteophyte complexes are present C4-C7 with at most mild canal stenosis at the C6-7 level. Multilevel uncinate spurring and facet hypertrophic changes are present as well with mild multilevel neural foraminal narrowing and more moderate bilateral foraminal impingement at C5-6 and C6-7. Upper chest: Stable calcified granuloma seen in the right lung apex. No acute abnormality in the upper chest or imaged lung apices. Some mild calcification in the visible proximal great vessels. Other: Normal thyroid gland. IMPRESSION: 1. Minimal left frontal scalp swelling without large hematoma or calvarial fracture. No acute intracranial abnormality. 2. Stable mild-to-moderate parenchymal volume loss and chronic microvascular ischemic white matter disease. 3. Left infraorbital and malar soft tissue swelling without soft tissue gas or foreign body. 4. No acute facial bone fracture or orbital injury identified. 5. No acute cervical spine fracture or traumatic listhesis. 6. Multilevel degenerative changes of the cervical spine as described above. 7. Cervical and intracranial atherosclerosis. Electronically Signed   By: Lovena Le M.D.   On: 09/20/2019 18:36   DG  Chest Port 1 View  Result Date: 09/20/2019 CLINICAL DATA:  Pt states she fell while visiting friend. Per notes- syncopal episode-complaining of left knee pain EXAM: PORTABLE CHEST 1 VIEW COMPARISON:  06/17/2017. FINDINGS: Cardiac silhouette is normal in size. No mediastinal or hilar masses. Linear opacities are noted at both lung bases consistent with atelectasis, similar to the prior chest radiograph and accentuated by low lung volumes. Remainder of the lungs is clear. No convincing pleural effusion and no pneumothorax. Spine fusion/stabilization hardware with pedicle screws and interconnecting rods. Interconnecting rods appear disrupted or unconnected, on the right at the L1-L2 level on the on the left closer to the L2-L3 level. IMPRESSION: 1. No acute cardiopulmonary disease. 2. Linear lung base opacities consistent with atelectasis. No convincing pneumonia or pulmonary edema. 3. Questionable disruption of interconnecting rods along the thoracolumbar fusion. Electronically Signed   By: Lajean Manes M.D.   On: 09/20/2019 18:18   DG Knee Complete 4 Views Left  Result Date: 09/20/2019 CLINICAL DATA:  Pt states she fell while visiting friend. Per notes- syncopal episode-complaining of left knee pain EXAM: LEFT KNEE - COMPLETE 4+ VIEW COMPARISON:  05/18/2016 FINDINGS: No fracture or bone lesion. Moderate medial joint space compartment narrowing associated marginal osteophytes. Small marginal  osteophytes from patella. Lateral compartment is relatively well preserved. No joint effusion. Surrounding soft tissues are unremarkable. IMPRESSION: 1. No fracture or acute finding. 2. Moderate osteoarthritis. Electronically Signed   By: Lajean Manes M.D.   On: 09/20/2019 18:14   CT MAXILLOFACIAL WO CONTRAST  Result Date: 09/20/2019 CLINICAL DATA:  Fall and syncopal episode, left cheek pain and infraorbital bruising EXAM: CT HEAD WITHOUT CONTRAST CT MAXILLOFACIAL WITHOUT CONTRAST CT CERVICAL SPINE WITHOUT CONTRAST  TECHNIQUE: Multidetector CT imaging of the head, cervical spine, and maxillofacial structures were performed using the standard protocol without intravenous contrast. Multiplanar CT image reconstructions of the cervical spine and maxillofacial structures were also generated. COMPARISON:  MRI 06/17/2017, CT head and cervical spine 06/17/2017 FINDINGS: CT HEAD FINDINGS Brain: No evidence of acute infarction, hemorrhage, hydrocephalus, extra-axial collection or mass lesion/mass effect. Basal cisterns are patent. Midline intracranial structures are normal. Cerebellar tonsils are normally positioned. Symmetric prominence of the ventricles, cisterns and sulci compatible with parenchymal volume loss. Patchy areas of white matter hypoattenuation are most compatible with mild-to-moderate chronic microvascular angiopathy. Benign dural calcifications are similar to priors. Vascular: Atherosclerotic calcification of the carotid siphons. No hyperdense vessel. Skull: Minimal left frontal scalp swelling without large hematoma or calvarial fracture. No other acute or suspicious osseous abnormality Other: None. CT MAXILLOFACIAL FINDINGS Osseous: No fracture of the bony orbits. Nasal bones are intact. No other mid face fractures are seen. The pterygoid plates are intact. No visible or suspected temporal bone fractures. Temporomandibular joints are normally aligned. The mandible is intact. No fractured or avulsed teeth. Orbits: Left infraorbital soft tissue swelling. No retro septal gas, stranding or hemorrhage. Prior bilateral lens extractions. Globes appear otherwise normal and symmetric. Symmetric appearance of the extraocular musculature and optic nerve sheath complexes. Normal caliber of the superior ophthalmic veins. Sinuses: Paranasal sinuses and mastoid air cells are predominantly clear. Middle ear cavities are clear. Ossicular chains are normally configured. Soft tissues: Left infraorbital and malar soft tissue swelling  without soft tissue gas or foreign body. Remote soft tissue thickening and radiodensity in the subcutaneous tissues of the superior nasal bridge unchanged from prior and may reflect some chronic scarring. No other significant soft tissue abnormalities. CT CERVICAL SPINE FINDINGS Alignment: Stabilization collar is in place at the time of examination. There is mild straightening of the normal cervical lordosis which is slightly more pronounced than on the comparison imaging in the lower cervical spine possibly related to positioning, stabilization or potential muscle spasm. Stepwise retrolisthesis C5-C7 and anterolisthesis C7 on T1 are grossly unchanged from comparison and favored to be on a degenerative, spondylitic basis. No evidence of traumatic listhesis. No abnormally widened, perched or jumped facets. Normal alignment of the craniocervical and atlantoaxial articulations. Skull base and vertebrae: No acute skull base fracture. No vertebral body fracture or height loss. Normal bone mineralization. No worrisome osseous lesions. Moderate atlantodental arthrosis with periarticular spurring and pseudoarticulation with the basion is unchanged from comparison. Multilevel spondylitic changes are better detailed below. Soft tissues and spinal canal: No pre or paravertebral fluid or swelling. No visible canal hematoma. Cervical carotid atherosclerosis with a slightly medialized course of the right internal carotid artery. Disc levels: Multilevel intervertebral disc height loss with spondylitic endplate changes. Slightly larger disc osteophyte complexes are present C4-C7 with at most mild canal stenosis at the C6-7 level. Multilevel uncinate spurring and facet hypertrophic changes are present as well with mild multilevel neural foraminal narrowing and more moderate bilateral foraminal impingement at C5-6 and C6-7. Upper chest: Stable  calcified granuloma seen in the right lung apex. No acute abnormality in the upper chest or  imaged lung apices. Some mild calcification in the visible proximal great vessels. Other: Normal thyroid gland. IMPRESSION: 1. Minimal left frontal scalp swelling without large hematoma or calvarial fracture. No acute intracranial abnormality. 2. Stable mild-to-moderate parenchymal volume loss and chronic microvascular ischemic white matter disease. 3. Left infraorbital and malar soft tissue swelling without soft tissue gas or foreign body. 4. No acute facial bone fracture or orbital injury identified. 5. No acute cervical spine fracture or traumatic listhesis. 6. Multilevel degenerative changes of the cervical spine as described above. 7. Cervical and intracranial atherosclerosis. Electronically Signed   By: Lovena Le M.D.   On: 09/20/2019 18:36    EKG: Independently reviewed.  Normal sinus rhythm.  Assessment/Plan Principal Problem:   Syncope Active Problems:   Acute lower UTI   Essential hypertension    1. Syncope -cause not clear.  Will check orthostatics.  Patient had no prodrome before the syncopal episodes are concerning for any cardiac event we will monitor in telemetry.  Will request cardiology consult. 2. Hypertension on Micardis. 3. Recurrent UTI presently will place patient on ceftriaxone.  Has been taking Keflex at home.  Per patient patient has urology follow-up for cystoscopy. 4. Anxiety and depression on Cymbalta and Klonopin. 5. Hyperlipidemia on Zetia.   DVT prophylaxis: SCDs for now. Code Status: Full code. Family Communication: Discussed with patient. Disposition Plan: Home. Consults called: None. Admission status: Observation.   Rise Patience MD [facility] Pager [phone].  If 7PM-7AM, please contact [email or web address] [email or web address] Password [email or web address]  09/21/2019, 1:07 AM

## 2019-09-21 NOTE — Progress Notes (Signed)
PROGRESS NOTE  Melinda Hartman JOA:[id number] DOB: May 08, 1940   PCP: [name], NP  Patient is from: Home.  Independently ambulates at baseline.  DOA: 09/20/2019 LOS: 0  Brief Narrative / Interim history: 79 year old female with history of HTN, asthma, HLD, anxiety, depression and recurrent UTI on prophylactic antibiotic presenting with syncopal episode without prodrome.  She did strike her head when she fell forward on the floor with LOC.  Patient has history of recurrent UTI.  Followed by urology.  She is on low-dose Keflex prophylactically.  She reports dysuria and increased frequency of urination.  No other symptoms.  In ED, slightly hypertensive.  Afebrile.  93% on RA.  CMP and CBC without significant finding.  UA with small Hgb, large leukocytes and rare bacteria.  Head, cervical spine and maxillofacial CT without acute finding except minimal left frontal scalp swelling without hematoma.  Left ankle x-ray and chest x-ray without acute finding.  EKG normal sinus rhythm without ischemic finding or abnormal interval.  Cultures obtained.  Patient was started on IV ceftriaxone and admitted for syncope and UTI.  Subjective: No major events overnight of this morning.  No complaints.  Denies chest pain, lightheadedness, palpitation, dyspnea, orthopnea, PND, nausea, vomiting, abdominal pain or any back pain.  Reports dysuria and increased frequency of urination.  She has history of recurrent UTI and has been taking prophylactic antibiotics.  Objective: Vitals:   09/21/19 0902 09/21/19 0903 09/21/19 0904 09/21/19 0956  BP: (!) 168/90 (!) 173/91 (!) 165/103 (!) 150/71  Pulse: 66 82 80 85  Resp:    17  Temp:      TempSrc:      SpO2:    93%    Intake/Output Summary (Last 24 hours) at 09/21/2019 1231 Last data filed at 09/21/2019 0341 Gross per 24 hour  Intake 100 ml  Output --  Net 100 ml   There were no vitals filed for this visit.  Examination:  GENERAL: No apparent distress.   Nontoxic. HEENT: MMM.  Vision and hearing grossly intact.  NECK: Supple.  No apparent JVD.  RESP:  No IWOB.  Fair aeration bilaterally. CVS:  RRR. Heart sounds normal.  ABD/GI/GU: BS+. Abd soft, NTND.  No CVA tenderness. MSK/EXT:  Moves extremities. No apparent deformity. No edema.  SKIN: no apparent skin lesion or wound NEURO: Awake, alert and oriented appropriately.  No apparent focal neuro deficit. PSYCH: Calm. Normal affect.  Procedures:  None.  Microbiology summarized: Blood cultures NGTD. Urine culture pending.  Assessment & Plan: Syncope-unclear etiology.  No prodromes.  No significant finding on EKG or telemetry.  Basic labs within normal.  She has UTI symptoms with concerning UA but not quite sure if this alone would explain his syncope.  Orthostatic vitals negative. -Check echocardiogram and TSH -Appreciate cardiology input-plan for 30-day event monitor and outpatient follow-up -Screen for COVID-19. -PT/OT eval  Acute urinary tract infection in patient with history of recurrent UTI-has dysuria and increased frequency.  Urinalysis somewhat concerning.  Blood culture negative. -Continue IV ceftriaxone -Check urine culture  Hypokalemia: Likely due to IV fluid. -Replenish and recheck -Check magnesium.  Essential hypertension: BP slightly elevated. -Avapro and instead of home Micardis.   -As needed labetalol  Anxiety and depression: Stable -Continue home medications  Hyperlipidemia: -Continue home Zetia   There is no height or weight on file to calculate BMI.         DVT prophylaxis:  Place and maintain sequential compression device Start: 09/21/19 0113  Code Status:  Full code Family Communication: Updated patient's husband at bedside. Status is: Observation  The patient will require care spanning > 2 midnights and should be moved to inpatient because: Ongoing diagnostic testing needed not appropriate for outpatient work up, IV treatments appropriate due  to intensity of illness or inability to take PO and Inpatient level of care appropriate due to severity of illness.  Remains on IV ceftriaxone pending urine cultures.  Echocardiogram pending.   Dispo: The patient is from: Home              Anticipated d/c is to: Home              Anticipated d/c date is: 1 day              Patient currently is not medically stable to d/c.       Consultants:  Cardiology   Sch Meds:  Scheduled Meds: . cholecalciferol  1,000 Units Oral Daily  . clonazePAM  1 mg Oral BID  . DULoxetine  30 mg Oral Daily  . ezetimibe  10 mg Oral Daily  . irbesartan  150 mg Oral Daily  . montelukast  10 mg Oral Daily  . oxybutynin  5 mg Oral Daily  . pantoprazole  40 mg Oral Daily  . potassium chloride SA  20 mEq Oral Daily   Continuous Infusions: . cefTRIAXone (ROCEPHIN)  IV Stopped (09/21/19 0341)   PRN Meds:.gabapentin, nitroGLYCERIN, ondansetron **OR** ondansetron (ZOFRAN) IV  Antimicrobials: Anti-infectives (From admission, onward)   Start     Dose/Rate Route Frequency Ordered Stop   09/21/19 0200  cefTRIAXone (ROCEPHIN) 1 g in sodium chloride 0.9 % 100 mL IVPB        1 g 200 mL/hr over 30 Minutes Intravenous Every 24 hours 09/21/19 0148         I have personally reviewed the following labs and images: CBC: Recent Labs  Lab 09/20/19 1720 09/21/19 0432  WBC 9.4 7.6  HGB 13.6 12.5  HCT 42.4 38.7  MCV 96.4 96.0  PLT 329 284   BMP &GFR Recent Labs  Lab 09/20/19 1720 09/21/19 0432  NA 139 142  K 3.7 3.3*  CL 104 107  CO2 26 27  GLUCOSE 99 105*  BUN 16 14  CREATININE 0.77 0.62  CALCIUM 9.9 8.7*   CrCl cannot be calculated (Unknown ideal weight.). Liver & Pancreas: Recent Labs  Lab 09/20/19 1720  AST 17  ALT 18  ALKPHOS 102  BILITOT 0.4  PROT 6.9  ALBUMIN 4.2   No results for input(s): LIPASE, AMYLASE in the last 168 hours. No results for input(s): AMMONIA in the last 168 hours. Diabetic: No results for input(s): HGBA1C in  the last 72 hours. No results for input(s): GLUCAP in the last 168 hours. Cardiac Enzymes: No results for input(s): CKTOTAL, CKMB, CKMBINDEX, TROPONINI in the last 168 hours. No results for input(s): PROBNP in the last 8760 hours. Coagulation Profile: No results for input(s): INR, PROTIME in the last 168 hours. Thyroid Function Tests: No results for input(s): TSH, T4TOTAL, FREET4, T3FREE, THYROIDAB in the last 72 hours. Lipid Profile: No results for input(s): CHOL, HDL, LDLCALC, TRIG, CHOLHDL, LDLDIRECT in the last 72 hours. Anemia Panel: No results for input(s): VITAMINB12, FOLATE, FERRITIN, TIBC, IRON, RETICCTPCT in the last 72 hours. Urine analysis:    Component Value Date/Time   COLORURINE YELLOW 09/20/2019 2300   APPEARANCEUR HAZY (A) 09/20/2019 2300   LABSPEC 1.013 09/20/2019 2300   PHURINE 5.0 09/20/2019 2300  GLUCOSEU NEGATIVE 09/20/2019 2300   HGBUR SMALL (A) 09/20/2019 2300   BILIRUBINUR NEGATIVE 09/20/2019 2300   KETONESUR NEGATIVE 09/20/2019 2300   PROTEINUR 30 (A) 09/20/2019 2300   NITRITE NEGATIVE 09/20/2019 2300   LEUKOCYTESUR LARGE (A) 09/20/2019 2300   Sepsis Labs: Invalid input(s): PROCALCITONIN, [place]  Microbiology: Recent Results (from the past 240 hour(s))  Blood culture (routine x 2)     Status: None (Preliminary result)   Collection Time: 09/20/19  5:30 PM   Specimen: BLOOD  Result Value Ref Range Status   Specimen Description   Final    BLOOD RIGHT ANTECUBITAL Performed at [facility] [address] [place], [place] [address]    Special Requests   Final    BOTTLES DRAWN AEROBIC AND ANAEROBIC Blood Culture adequate volume Performed at [facility] [address], [place], [place] [address]    Culture   Final    NO GROWTH < 12 HOURS Performed at [facility] [address], [place], [place] [address]    Report Status PENDING  Incomplete    Radiology Studies: CT Head Wo Contrast  Result  Date: 09/20/2019 CLINICAL DATA:  Fall and syncopal episode, left cheek pain and infraorbital bruising EXAM: CT HEAD WITHOUT CONTRAST CT MAXILLOFACIAL WITHOUT CONTRAST CT CERVICAL SPINE WITHOUT CONTRAST TECHNIQUE: Multidetector CT imaging of the head, cervical spine, and maxillofacial structures were performed using the standard protocol without intravenous contrast. Multiplanar CT image reconstructions of the cervical spine and maxillofacial structures were also generated. COMPARISON:  MRI 06/17/2017, CT head and cervical spine 06/17/2017 FINDINGS: CT HEAD FINDINGS Brain: No evidence of acute infarction, hemorrhage, hydrocephalus, extra-axial collection or mass lesion/mass effect. Basal cisterns are patent. Midline intracranial structures are normal. Cerebellar tonsils are normally positioned. Symmetric prominence of the ventricles, cisterns and sulci compatible with parenchymal volume loss. Patchy areas of white matter hypoattenuation are most compatible with mild-to-moderate chronic microvascular angiopathy. Benign dural calcifications are similar to priors. Vascular: Atherosclerotic calcification of the carotid siphons. No hyperdense vessel. Skull: Minimal left frontal scalp swelling without large hematoma or calvarial fracture. No other acute or suspicious osseous abnormality Other: None. CT MAXILLOFACIAL FINDINGS Osseous: No fracture of the bony orbits. Nasal bones are intact. No other mid face fractures are seen. The pterygoid plates are intact. No visible or suspected temporal bone fractures. Temporomandibular joints are normally aligned. The mandible is intact. No fractured or avulsed teeth. Orbits: Left infraorbital soft tissue swelling. No retro septal gas, stranding or hemorrhage. Prior bilateral lens extractions. Globes appear otherwise normal and symmetric. Symmetric appearance of the extraocular musculature and optic nerve sheath complexes. Normal caliber of the superior ophthalmic veins. Sinuses:  Paranasal sinuses and mastoid air cells are predominantly clear. Middle ear cavities are clear. Ossicular chains are normally configured. Soft tissues: Left infraorbital and malar soft tissue swelling without soft tissue gas or foreign body. Remote soft tissue thickening and radiodensity in the subcutaneous tissues of the superior nasal bridge unchanged from prior and may reflect some chronic scarring. No other significant soft tissue abnormalities. CT CERVICAL SPINE FINDINGS Alignment: Stabilization collar is in place at the time of examination. There is mild straightening of the normal cervical lordosis which is slightly more pronounced than on the comparison imaging in the lower cervical spine possibly related to positioning, stabilization or potential muscle spasm. Stepwise retrolisthesis C5-C7 and anterolisthesis C7 on T1 are grossly unchanged from comparison and favored to be on a degenerative, spondylitic basis. No evidence of traumatic listhesis.  No abnormally widened, perched or jumped facets. Normal alignment of the craniocervical and atlantoaxial articulations. Skull base and vertebrae: No acute skull base fracture. No vertebral body fracture or height loss. Normal bone mineralization. No worrisome osseous lesions. Moderate atlantodental arthrosis with periarticular spurring and pseudoarticulation with the basion is unchanged from comparison. Multilevel spondylitic changes are better detailed below. Soft tissues and spinal canal: No pre or paravertebral fluid or swelling. No visible canal hematoma. Cervical carotid atherosclerosis with a slightly medialized course of the right internal carotid artery. Disc levels: Multilevel intervertebral disc height loss with spondylitic endplate changes. Slightly larger disc osteophyte complexes are present C4-C7 with at most mild canal stenosis at the C6-7 level. Multilevel uncinate spurring and facet hypertrophic changes are present as well with mild multilevel  neural foraminal narrowing and more moderate bilateral foraminal impingement at C5-6 and C6-7. Upper chest: Stable calcified granuloma seen in the right lung apex. No acute abnormality in the upper chest or imaged lung apices. Some mild calcification in the visible proximal great vessels. Other: Normal thyroid gland. IMPRESSION: 1. Minimal left frontal scalp swelling without large hematoma or calvarial fracture. No acute intracranial abnormality. 2. Stable mild-to-moderate parenchymal volume loss and chronic microvascular ischemic white matter disease. 3. Left infraorbital and malar soft tissue swelling without soft tissue gas or foreign body. 4. No acute facial bone fracture or orbital injury identified. 5. No acute cervical spine fracture or traumatic listhesis. 6. Multilevel degenerative changes of the cervical spine as described above. 7. Cervical and intracranial atherosclerosis. Electronically Signed   By: Lovena Le M.D.   On: 09/20/2019 18:36   CT Cervical Spine Wo Contrast  Result Date: 09/20/2019 CLINICAL DATA:  Fall and syncopal episode, left cheek pain and infraorbital bruising EXAM: CT HEAD WITHOUT CONTRAST CT MAXILLOFACIAL WITHOUT CONTRAST CT CERVICAL SPINE WITHOUT CONTRAST TECHNIQUE: Multidetector CT imaging of the head, cervical spine, and maxillofacial structures were performed using the standard protocol without intravenous contrast. Multiplanar CT image reconstructions of the cervical spine and maxillofacial structures were also generated. COMPARISON:  MRI 06/17/2017, CT head and cervical spine 06/17/2017 FINDINGS: CT HEAD FINDINGS Brain: No evidence of acute infarction, hemorrhage, hydrocephalus, extra-axial collection or mass lesion/mass effect. Basal cisterns are patent. Midline intracranial structures are normal. Cerebellar tonsils are normally positioned. Symmetric prominence of the ventricles, cisterns and sulci compatible with parenchymal volume loss. Patchy areas of white matter  hypoattenuation are most compatible with mild-to-moderate chronic microvascular angiopathy. Benign dural calcifications are similar to priors. Vascular: Atherosclerotic calcification of the carotid siphons. No hyperdense vessel. Skull: Minimal left frontal scalp swelling without large hematoma or calvarial fracture. No other acute or suspicious osseous abnormality Other: None. CT MAXILLOFACIAL FINDINGS Osseous: No fracture of the bony orbits. Nasal bones are intact. No other mid face fractures are seen. The pterygoid plates are intact. No visible or suspected temporal bone fractures. Temporomandibular joints are normally aligned. The mandible is intact. No fractured or avulsed teeth. Orbits: Left infraorbital soft tissue swelling. No retro septal gas, stranding or hemorrhage. Prior bilateral lens extractions. Globes appear otherwise normal and symmetric. Symmetric appearance of the extraocular musculature and optic nerve sheath complexes. Normal caliber of the superior ophthalmic veins. Sinuses: Paranasal sinuses and mastoid air cells are predominantly clear. Middle ear cavities are clear. Ossicular chains are normally configured. Soft tissues: Left infraorbital and malar soft tissue swelling without soft tissue gas or foreign body. Remote soft tissue thickening and radiodensity in the subcutaneous tissues of the superior nasal bridge unchanged from prior and  may reflect some chronic scarring. No other significant soft tissue abnormalities. CT CERVICAL SPINE FINDINGS Alignment: Stabilization collar is in place at the time of examination. There is mild straightening of the normal cervical lordosis which is slightly more pronounced than on the comparison imaging in the lower cervical spine possibly related to positioning, stabilization or potential muscle spasm. Stepwise retrolisthesis C5-C7 and anterolisthesis C7 on T1 are grossly unchanged from comparison and favored to be on a degenerative, spondylitic basis. No  evidence of traumatic listhesis. No abnormally widened, perched or jumped facets. Normal alignment of the craniocervical and atlantoaxial articulations. Skull base and vertebrae: No acute skull base fracture. No vertebral body fracture or height loss. Normal bone mineralization. No worrisome osseous lesions. Moderate atlantodental arthrosis with periarticular spurring and pseudoarticulation with the basion is unchanged from comparison. Multilevel spondylitic changes are better detailed below. Soft tissues and spinal canal: No pre or paravertebral fluid or swelling. No visible canal hematoma. Cervical carotid atherosclerosis with a slightly medialized course of the right internal carotid artery. Disc levels: Multilevel intervertebral disc height loss with spondylitic endplate changes. Slightly larger disc osteophyte complexes are present C4-C7 with at most mild canal stenosis at the C6-7 level. Multilevel uncinate spurring and facet hypertrophic changes are present as well with mild multilevel neural foraminal narrowing and more moderate bilateral foraminal impingement at C5-6 and C6-7. Upper chest: Stable calcified granuloma seen in the right lung apex. No acute abnormality in the upper chest or imaged lung apices. Some mild calcification in the visible proximal great vessels. Other: Normal thyroid gland. IMPRESSION: 1. Minimal left frontal scalp swelling without large hematoma or calvarial fracture. No acute intracranial abnormality. 2. Stable mild-to-moderate parenchymal volume loss and chronic microvascular ischemic white matter disease. 3. Left infraorbital and malar soft tissue swelling without soft tissue gas or foreign body. 4. No acute facial bone fracture or orbital injury identified. 5. No acute cervical spine fracture or traumatic listhesis. 6. Multilevel degenerative changes of the cervical spine as described above. 7. Cervical and intracranial atherosclerosis. Electronically Signed   By: Lovena Le  M.D.   On: 09/20/2019 18:36   DG Chest Port 1 View  Result Date: 09/20/2019 CLINICAL DATA:  Pt states she fell while visiting friend. Per notes- syncopal episode-complaining of left knee pain EXAM: PORTABLE CHEST 1 VIEW COMPARISON:  06/17/2017. FINDINGS: Cardiac silhouette is normal in size. No mediastinal or hilar masses. Linear opacities are noted at both lung bases consistent with atelectasis, similar to the prior chest radiograph and accentuated by low lung volumes. Remainder of the lungs is clear. No convincing pleural effusion and no pneumothorax. Spine fusion/stabilization hardware with pedicle screws and interconnecting rods. Interconnecting rods appear disrupted or unconnected, on the right at the L1-L2 level on the on the left closer to the L2-L3 level. IMPRESSION: 1. No acute cardiopulmonary disease. 2. Linear lung base opacities consistent with atelectasis. No convincing pneumonia or pulmonary edema. 3. Questionable disruption of interconnecting rods along the thoracolumbar fusion. Electronically Signed   By: Lajean Manes M.D.   On: 09/20/2019 18:18   DG Knee Complete 4 Views Left  Result Date: 09/20/2019 CLINICAL DATA:  Pt states she fell while visiting friend. Per notes- syncopal episode-complaining of left knee pain EXAM: LEFT KNEE - COMPLETE 4+ VIEW COMPARISON:  05/18/2016 FINDINGS: No fracture or bone lesion. Moderate medial joint space compartment narrowing associated marginal osteophytes. Small marginal osteophytes from patella. Lateral compartment is relatively well preserved. No joint effusion. Surrounding soft tissues are unremarkable. IMPRESSION: 1.  No fracture or acute finding. 2. Moderate osteoarthritis. Electronically Signed   By: Lajean Manes M.D.   On: 09/20/2019 18:14   CT MAXILLOFACIAL WO CONTRAST  Result Date: 09/20/2019 CLINICAL DATA:  Fall and syncopal episode, left cheek pain and infraorbital bruising EXAM: CT HEAD WITHOUT CONTRAST CT MAXILLOFACIAL WITHOUT CONTRAST CT  CERVICAL SPINE WITHOUT CONTRAST TECHNIQUE: Multidetector CT imaging of the head, cervical spine, and maxillofacial structures were performed using the standard protocol without intravenous contrast. Multiplanar CT image reconstructions of the cervical spine and maxillofacial structures were also generated. COMPARISON:  MRI 06/17/2017, CT head and cervical spine 06/17/2017 FINDINGS: CT HEAD FINDINGS Brain: No evidence of acute infarction, hemorrhage, hydrocephalus, extra-axial collection or mass lesion/mass effect. Basal cisterns are patent. Midline intracranial structures are normal. Cerebellar tonsils are normally positioned. Symmetric prominence of the ventricles, cisterns and sulci compatible with parenchymal volume loss. Patchy areas of white matter hypoattenuation are most compatible with mild-to-moderate chronic microvascular angiopathy. Benign dural calcifications are similar to priors. Vascular: Atherosclerotic calcification of the carotid siphons. No hyperdense vessel. Skull: Minimal left frontal scalp swelling without large hematoma or calvarial fracture. No other acute or suspicious osseous abnormality Other: None. CT MAXILLOFACIAL FINDINGS Osseous: No fracture of the bony orbits. Nasal bones are intact. No other mid face fractures are seen. The pterygoid plates are intact. No visible or suspected temporal bone fractures. Temporomandibular joints are normally aligned. The mandible is intact. No fractured or avulsed teeth. Orbits: Left infraorbital soft tissue swelling. No retro septal gas, stranding or hemorrhage. Prior bilateral lens extractions. Globes appear otherwise normal and symmetric. Symmetric appearance of the extraocular musculature and optic nerve sheath complexes. Normal caliber of the superior ophthalmic veins. Sinuses: Paranasal sinuses and mastoid air cells are predominantly clear. Middle ear cavities are clear. Ossicular chains are normally configured. Soft tissues: Left infraorbital and  malar soft tissue swelling without soft tissue gas or foreign body. Remote soft tissue thickening and radiodensity in the subcutaneous tissues of the superior nasal bridge unchanged from prior and may reflect some chronic scarring. No other significant soft tissue abnormalities. CT CERVICAL SPINE FINDINGS Alignment: Stabilization collar is in place at the time of examination. There is mild straightening of the normal cervical lordosis which is slightly more pronounced than on the comparison imaging in the lower cervical spine possibly related to positioning, stabilization or potential muscle spasm. Stepwise retrolisthesis C5-C7 and anterolisthesis C7 on T1 are grossly unchanged from comparison and favored to be on a degenerative, spondylitic basis. No evidence of traumatic listhesis. No abnormally widened, perched or jumped facets. Normal alignment of the craniocervical and atlantoaxial articulations. Skull base and vertebrae: No acute skull base fracture. No vertebral body fracture or height loss. Normal bone mineralization. No worrisome osseous lesions. Moderate atlantodental arthrosis with periarticular spurring and pseudoarticulation with the basion is unchanged from comparison. Multilevel spondylitic changes are better detailed below. Soft tissues and spinal canal: No pre or paravertebral fluid or swelling. No visible canal hematoma. Cervical carotid atherosclerosis with a slightly medialized course of the right internal carotid artery. Disc levels: Multilevel intervertebral disc height loss with spondylitic endplate changes. Slightly larger disc osteophyte complexes are present C4-C7 with at most mild canal stenosis at the C6-7 level. Multilevel uncinate spurring and facet hypertrophic changes are present as well with mild multilevel neural foraminal narrowing and more moderate bilateral foraminal impingement at C5-6 and C6-7. Upper chest: Stable calcified granuloma seen in the right lung apex. No acute  abnormality in the upper chest or imaged lung  apices. Some mild calcification in the visible proximal great vessels. Other: Normal thyroid gland. IMPRESSION: 1. Minimal left frontal scalp swelling without large hematoma or calvarial fracture. No acute intracranial abnormality. 2. Stable mild-to-moderate parenchymal volume loss and chronic microvascular ischemic white matter disease. 3. Left infraorbital and malar soft tissue swelling without soft tissue gas or foreign body. 4. No acute facial bone fracture or orbital injury identified. 5. No acute cervical spine fracture or traumatic listhesis. 6. Multilevel degenerative changes of the cervical spine as described above. 7. Cervical and intracranial atherosclerosis. Electronically Signed   By: Lovena Le M.D.   On: 09/20/2019 18:36     Liberta Gimpel T. Anita  If 7PM-7AM, please contact night-coverage [email or web address] 09/21/2019, 12:31 PM

## 2019-09-21 NOTE — ED Notes (Signed)
Pt used one person assist with stand and pivot to bedside commode.

## 2019-09-22 ENCOUNTER — Other Ambulatory Visit: Payer: Self-pay

## 2019-09-22 ENCOUNTER — Other Ambulatory Visit: Payer: Self-pay | Admitting: Physician Assistant

## 2019-09-22 DIAGNOSIS — Z9189 Other specified personal risk factors, not elsewhere classified: Secondary | ICD-10-CM

## 2019-09-22 DIAGNOSIS — E785 Hyperlipidemia, unspecified: Secondary | ICD-10-CM

## 2019-09-22 LAB — RENAL FUNCTION PANEL
Albumin: 3.4 g/dL — ABNORMAL LOW (ref 3.5–5.0)
Anion gap: 9 (ref 5–15)
BUN: 14 mg/dL (ref 8–23)
CO2: 25 mmol/L (ref 22–32)
Calcium: 8.8 mg/dL — ABNORMAL LOW (ref 8.9–10.3)
Chloride: 108 mmol/L (ref 98–111)
Creatinine, Ser: 0.7 mg/dL (ref 0.44–1.00)
GFR calc Af Amer: >60 mL/min (ref >60)
GFR calc non Af Amer: >60 mL/min (ref >60)
Glucose, Bld: 103 mg/dL — ABNORMAL HIGH (ref 70–99)
Phosphorus: 4.2 mg/dL (ref 2.5–4.6)
Potassium: 3.5 mmol/L (ref 3.5–5.1)
Sodium: 142 mmol/L (ref 135–145)

## 2019-09-22 LAB — URINE CULTURE: Culture: 10000 — AB

## 2019-09-22 LAB — MAGNESIUM: Magnesium: 2.2 mg/dL (ref 1.7–2.4)

## 2019-09-22 MED ORDER — IRBESARTAN 300 MG PO TABS
300.0000 mg | ORAL_TABLET | Freq: Every day | ORAL | Status: DC
  Administered 2019-09-22: 300 mg via ORAL
  Filled 2019-09-22: qty 1

## 2019-09-22 MED ORDER — CEPHALEXIN 500 MG PO CAPS: 500.0000 mg | ORAL_CAPSULE | Freq: Three times a day (TID) | ORAL | 0 refills | Status: AC

## 2019-09-22 MED ORDER — SODIUM CHLORIDE 0.9 % IV SOLN
INTRAVENOUS | Status: DC | PRN
  Administered 2019-09-22: 250 mL via INTRAVENOUS

## 2019-09-22 NOTE — Progress Notes (Signed)
Assessment unchanged. Pt and husband verbalized understanding through teach back including Antibiotic to take post dc and follow up care. Dc'd via wc to front entrance accompanied by husband and NT.

## 2019-09-22 NOTE — Discharge Summary (Signed)
Physician Discharge Summary  Melinda Hartman ZHG:[id number] DOB: 1940/05/09 DOA: 09/20/2019  PCP: [name], NP  Admit date: 09/20/2019 Discharge date: 09/22/2019  Admitted From: Home Disposition: Home  Recommendations for Outpatient Follow-up:  1. Follow ups as below. 2. Please obtain CBC/BMP/Mag at follow up 3. Please follow up on the following pending results: Urine culture  Home Health: None required Equipment/Devices: None required  Discharge Condition: Stable CODE STATUS: Full code   Follow-up Information    Minus Breeding, MD Follow up on 11/06/2019.   Specialty: Cardiology Why: 4:20 Pm  Contact information: [address] [place] [address] [phone]        [name], NP. Schedule an appointment as soon as possible for a visit in 1 week(s).   Specialty: Internal Medicine Contact information: [address] [place] [address] [phone]               Hospital Course: 78 year old female with history of HTN, asthma, HLD, anxiety, depression and recurrent UTI on prophylactic antibiotic presenting with syncopal episode without prodrome.  She did strike her head when she fell forward on the floor with LOC.  Patient has history of recurrent UTI.  Followed by urology.  She is on low-dose Keflex prophylactically.  She reports dysuria and increased frequency of urination.  No other symptoms.  In ED, slightly hypertensive.  Afebrile.  93% on RA.  CMP and CBC without significant finding.  UA with small Hgb, large leukocytes and rare bacteria.  Head, cervical spine and maxillofacial CT without acute finding except minimal left frontal scalp swelling without hematoma.  Left ankle x-ray and chest x-ray without acute finding.  EKG normal sinus rhythm without ischemic finding or abnormal interval.  Cultures obtained.  Patient was started on IV ceftriaxone and admitted for syncope and UTI.  Syncopal work-up including EKG, telemetry and  echocardiogram without significant finding.  Patient to follow-up with cardiology for 30-day event monitor.  In regards to UTI, she was a started on IV ceftriaxone.  Urine culture pending.  She was very eager to go home.  She was discharged on p.o. Keflex 500 mg 3 times daily for 5 days.  She understand that we may have to change antibiotic based on culture sensitivity.  She will follow up with a PCP and neurologist.  Patient was evaluated by therapy and no need was identified.  See individual problem list below for more hospital course.  Discharge Diagnoses:  Syncope-unclear etiology.  No prodromes.  No significant finding on EKG, telemetry and echocardiogram.  TSH and basic labs within normal.  She might be at risk for polypharmacy on Klonopin, tramadol and gabapentin.  She has UTI symptoms with concerning UA but not quite sure if this alone would explain her syncope.  Orthostatic vitals negative. -Patient to follow-up with cardiology for 30-day event monitor  Acute urinary tract infection in patient with history of recurrent UTI-has dysuria and increased frequency.  Urinalysis somewhat concerning.  Blood culture negative.  Urine culture pending but patient very eager to go home. -Continue IV ceftriaxone 9/2-9/4.  Keflex 500 mg 3 times daily for 4 more days -We will follow urine culture and sensitivity  Hypokalemia: K improved to 3.5.  Received K-Dur prior to discharge.  Magnesium within normal.  Essential hypertension: BP slightly elevated. -Resume home Micardis  Anxiety and depression: Stable -Continue home medications  Hyperlipidemia: -Continue home Zetia  At risk for polypharmacy: She is on Klonopin, tramadol and gabapentin  which could increase her risk of fall -Recommend weaning if possible.    Body mass index is 30.61 kg/m.            Discharge Exam: Vitals:   09/22/19 0541 09/22/19 0645  BP: (!) 182/69 (!) 158/98  Pulse: 78   Resp: 18   Temp: 97.8 F  (36.6 C)   SpO2: 100%     GENERAL: No apparent distress.  Nontoxic. HEENT: MMM.  Vision grossly intact.  Diminished hearing. NECK: Supple.  No apparent JVD.  RESP: On room air.  No IWOB.  Fair aeration bilaterally. CVS:  RRR. Heart sounds normal.  ABD/GI/GU: Bowel sounds present. Soft. Non tender.  MSK/EXT:  Moves extremities. No apparent deformity. No edema.  SKIN: no apparent skin lesion or wound NEURO: Awake, alert and oriented appropriately.  No apparent focal neuro deficit. PSYCH: Calm. Normal affect.  Discharge Instructions  Discharge Instructions    Call MD for:  difficulty breathing, headache or visual disturbances   Complete by: As directed    Call MD for:  extreme fatigue   Complete by: As directed    Call MD for:  persistant dizziness or light-headedness   Complete by: As directed    Call MD for:  persistant nausea and vomiting   Complete by: As directed    Call MD for:  temperature >100.4   Complete by: As directed    Diet - low sodium heart healthy   Complete by: As directed    Discharge instructions   Complete by: As directed    It has been a pleasure taking care of you!  You were hospitalized due to syncope (passing out) and urinary tract infection.  The test is we have done about your heart including EKG and echocardiogram are basically normal.  Please follow-up with cardiologist for heart monitor to exclude abnormal heart rhythm.  In regards to urinary tract infection, urine culture is still pending.  Since you are eager to go home, we are discharging you on oral antibiotic which is subject to change based on your urine culture result.  We will notify you if we need to change your antibiotic after urine culture results.  Follow-up with your primary care doctor and urologist in 1 to 2 weeks.   We may have started you on other new medications or made some changes to your home medications during this hospitalization. Please review your new medication list and the  directions carefully before you take them.  Of note, we noted that you are on Klonopin, tramadol and gabapentin.  These combination of medications could increase your risk of fall.  You may have to discuss this with your prescriber.     Please go to your hospital follow-up appointments or call to schedule as recommended.   Take care,   Increase activity slowly   Complete by: As directed      Allergies as of 09/22/2019      Reactions   Caffeine Shortness Of Breath   Haloperidol Shortness Of Breath   Food    Allergy to seafood   Nicotine Other (See Comments)   asthma   Tape    Said it as a disappearing tape but not sure      Medication List    TAKE these medications   acetaminophen 325 MG tablet Commonly known as: TYLENOL Take 325 mg by mouth every 6 (six) hours as needed for moderate pain.   aspirin EC 81 MG tablet Take 81 mg by mouth  daily.   celecoxib 100 MG capsule Commonly known as: CELEBREX Take 100 mg by mouth 2 (two) times daily.   cephALEXin 500 MG capsule Commonly known as: KEFLEX Take 1 capsule (500 mg total) by mouth 3 (three) times daily for 5 days.   cholecalciferol 25 MCG (1000 UNIT) tablet Commonly known as: VITAMIN D3 Take 1,000 Units by mouth daily.   cholestyramine 4 g packet Commonly known as: Questran Take once daily 2 hours before or after all other medications.   clonazePAM 0.5 MG tablet Commonly known as: KLONOPIN Take 1 mg by mouth 2 (two) times daily.   DULoxetine 30 MG capsule Commonly known as: CYMBALTA Take 30 mg by mouth daily.   EpiPen 2-Pak 0.3 mg/0.3 mL Soaj injection Generic drug: EPINEPHrine Inject 0.3 mg into the muscle as needed for anaphylaxis.   esomeprazole 40 MG capsule Commonly known as: NEXIUM Take 40 mg by mouth 2 (two) times daily.   ezetimibe 10 MG tablet Commonly known as: ZETIA Take 10 mg by mouth daily.   fluticasone 0.05 % cream Commonly known as: Cutivate Apply topically 2 (two) times daily.     gabapentin 300 MG capsule Commonly known as: NEURONTIN Take 600 mg by mouth 2 (two) times daily as needed (sometimes 3 times daily).   Micardis 40 MG tablet Generic drug: telmisartan Take 40 mg by mouth daily.   nitroGLYCERIN 0.4 MG SL tablet Commonly known as: NITROSTAT Place 0.4 mg under the tongue every 5 (five) minutes as needed for chest pain.   oxybutynin 5 MG 24 hr tablet Commonly known as: DITROPAN-XL Take 5 mg by mouth daily.   polyethylene glycol 17 g packet Commonly known as: MIRALAX / GLYCOLAX Take 17 g by mouth daily as needed for mild constipation.   potassium chloride SA 20 MEQ tablet Commonly known as: KLOR-CON Take 20 mEq by mouth daily.   Premarin vaginal cream Generic drug: conjugated estrogens Place 1 Applicatorful vaginally See admin instructions. 3 times a week   Singulair 10 MG tablet Generic drug: montelukast Take 10 mg by mouth daily.   traMADol 50 MG tablet Commonly known as: ULTRAM Take 50 mg by mouth every 12 (twelve) hours as needed for moderate pain.       Consultations:  Cardiology  Procedures/Studies:  2D Echo on 09/21/2019  1. Left ventricular ejection fraction, by estimation, is 60 to 65%. The  left ventricle has normal function. The left ventricle has no regional  wall motion abnormalities. Left ventricular diastolic parameters are  consistent with Grade I diastolic  dysfunction (impaired relaxation).  2. Right ventricular systolic function is normal. The right ventricular  size is normal.  3. Left atrial size was moderately dilated.  4. The mitral valve is normal in structure. Trivial mitral valve  regurgitation. No evidence of mitral stenosis.  5. The aortic valve was not well visualized. Aortic valve regurgitation  is not visualized. No aortic stenosis is present.  6. The inferior vena cava is normal in size with greater than 50%  respiratory variability, suggesting right atrial pressure of 3 mmHg.    CT Head Wo  Contrast  Result Date: 09/20/2019 CLINICAL DATA:  Fall and syncopal episode, left cheek pain and infraorbital bruising EXAM: CT HEAD WITHOUT CONTRAST CT MAXILLOFACIAL WITHOUT CONTRAST CT CERVICAL SPINE WITHOUT CONTRAST TECHNIQUE: Multidetector CT imaging of the head, cervical spine, and maxillofacial structures were performed using the standard protocol without intravenous contrast. Multiplanar CT image reconstructions of the cervical spine and maxillofacial structures were also  generated. COMPARISON:  MRI 06/17/2017, CT head and cervical spine 06/17/2017 FINDINGS: CT HEAD FINDINGS Brain: No evidence of acute infarction, hemorrhage, hydrocephalus, extra-axial collection or mass lesion/mass effect. Basal cisterns are patent. Midline intracranial structures are normal. Cerebellar tonsils are normally positioned. Symmetric prominence of the ventricles, cisterns and sulci compatible with parenchymal volume loss. Patchy areas of white matter hypoattenuation are most compatible with mild-to-moderate chronic microvascular angiopathy. Benign dural calcifications are similar to priors. Vascular: Atherosclerotic calcification of the carotid siphons. No hyperdense vessel. Skull: Minimal left frontal scalp swelling without large hematoma or calvarial fracture. No other acute or suspicious osseous abnormality Other: None. CT MAXILLOFACIAL FINDINGS Osseous: No fracture of the bony orbits. Nasal bones are intact. No other mid face fractures are seen. The pterygoid plates are intact. No visible or suspected temporal bone fractures. Temporomandibular joints are normally aligned. The mandible is intact. No fractured or avulsed teeth. Orbits: Left infraorbital soft tissue swelling. No retro septal gas, stranding or hemorrhage. Prior bilateral lens extractions. Globes appear otherwise normal and symmetric. Symmetric appearance of the extraocular musculature and optic nerve sheath complexes. Normal caliber of the superior ophthalmic  veins. Sinuses: Paranasal sinuses and mastoid air cells are predominantly clear. Middle ear cavities are clear. Ossicular chains are normally configured. Soft tissues: Left infraorbital and malar soft tissue swelling without soft tissue gas or foreign body. Remote soft tissue thickening and radiodensity in the subcutaneous tissues of the superior nasal bridge unchanged from prior and may reflect some chronic scarring. No other significant soft tissue abnormalities. CT CERVICAL SPINE FINDINGS Alignment: Stabilization collar is in place at the time of examination. There is mild straightening of the normal cervical lordosis which is slightly more pronounced than on the comparison imaging in the lower cervical spine possibly related to positioning, stabilization or potential muscle spasm. Stepwise retrolisthesis C5-C7 and anterolisthesis C7 on T1 are grossly unchanged from comparison and favored to be on a degenerative, spondylitic basis. No evidence of traumatic listhesis. No abnormally widened, perched or jumped facets. Normal alignment of the craniocervical and atlantoaxial articulations. Skull base and vertebrae: No acute skull base fracture. No vertebral body fracture or height loss. Normal bone mineralization. No worrisome osseous lesions. Moderate atlantodental arthrosis with periarticular spurring and pseudoarticulation with the basion is unchanged from comparison. Multilevel spondylitic changes are better detailed below. Soft tissues and spinal canal: No pre or paravertebral fluid or swelling. No visible canal hematoma. Cervical carotid atherosclerosis with a slightly medialized course of the right internal carotid artery. Disc levels: Multilevel intervertebral disc height loss with spondylitic endplate changes. Slightly larger disc osteophyte complexes are present C4-C7 with at most mild canal stenosis at the C6-7 level. Multilevel uncinate spurring and facet hypertrophic changes are present as well with mild  multilevel neural foraminal narrowing and more moderate bilateral foraminal impingement at C5-6 and C6-7. Upper chest: Stable calcified granuloma seen in the right lung apex. No acute abnormality in the upper chest or imaged lung apices. Some mild calcification in the visible proximal great vessels. Other: Normal thyroid gland. IMPRESSION: 1. Minimal left frontal scalp swelling without large hematoma or calvarial fracture. No acute intracranial abnormality. 2. Stable mild-to-moderate parenchymal volume loss and chronic microvascular ischemic white matter disease. 3. Left infraorbital and malar soft tissue swelling without soft tissue gas or foreign body. 4. No acute facial bone fracture or orbital injury identified. 5. No acute cervical spine fracture or traumatic listhesis. 6. Multilevel degenerative changes of the cervical spine as described above. 7. Cervical and intracranial atherosclerosis.  Electronically Signed   By: Lovena Le M.D.   On: 09/20/2019 18:36   CT Cervical Spine Wo Contrast  Result Date: 09/20/2019 CLINICAL DATA:  Fall and syncopal episode, left cheek pain and infraorbital bruising EXAM: CT HEAD WITHOUT CONTRAST CT MAXILLOFACIAL WITHOUT CONTRAST CT CERVICAL SPINE WITHOUT CONTRAST TECHNIQUE: Multidetector CT imaging of the head, cervical spine, and maxillofacial structures were performed using the standard protocol without intravenous contrast. Multiplanar CT image reconstructions of the cervical spine and maxillofacial structures were also generated. COMPARISON:  MRI 06/17/2017, CT head and cervical spine 06/17/2017 FINDINGS: CT HEAD FINDINGS Brain: No evidence of acute infarction, hemorrhage, hydrocephalus, extra-axial collection or mass lesion/mass effect. Basal cisterns are patent. Midline intracranial structures are normal. Cerebellar tonsils are normally positioned. Symmetric prominence of the ventricles, cisterns and sulci compatible with parenchymal volume loss. Patchy areas of white  matter hypoattenuation are most compatible with mild-to-moderate chronic microvascular angiopathy. Benign dural calcifications are similar to priors. Vascular: Atherosclerotic calcification of the carotid siphons. No hyperdense vessel. Skull: Minimal left frontal scalp swelling without large hematoma or calvarial fracture. No other acute or suspicious osseous abnormality Other: None. CT MAXILLOFACIAL FINDINGS Osseous: No fracture of the bony orbits. Nasal bones are intact. No other mid face fractures are seen. The pterygoid plates are intact. No visible or suspected temporal bone fractures. Temporomandibular joints are normally aligned. The mandible is intact. No fractured or avulsed teeth. Orbits: Left infraorbital soft tissue swelling. No retro septal gas, stranding or hemorrhage. Prior bilateral lens extractions. Globes appear otherwise normal and symmetric. Symmetric appearance of the extraocular musculature and optic nerve sheath complexes. Normal caliber of the superior ophthalmic veins. Sinuses: Paranasal sinuses and mastoid air cells are predominantly clear. Middle ear cavities are clear. Ossicular chains are normally configured. Soft tissues: Left infraorbital and malar soft tissue swelling without soft tissue gas or foreign body. Remote soft tissue thickening and radiodensity in the subcutaneous tissues of the superior nasal bridge unchanged from prior and may reflect some chronic scarring. No other significant soft tissue abnormalities. CT CERVICAL SPINE FINDINGS Alignment: Stabilization collar is in place at the time of examination. There is mild straightening of the normal cervical lordosis which is slightly more pronounced than on the comparison imaging in the lower cervical spine possibly related to positioning, stabilization or potential muscle spasm. Stepwise retrolisthesis C5-C7 and anterolisthesis C7 on T1 are grossly unchanged from comparison and favored to be on a degenerative, spondylitic  basis. No evidence of traumatic listhesis. No abnormally widened, perched or jumped facets. Normal alignment of the craniocervical and atlantoaxial articulations. Skull base and vertebrae: No acute skull base fracture. No vertebral body fracture or height loss. Normal bone mineralization. No worrisome osseous lesions. Moderate atlantodental arthrosis with periarticular spurring and pseudoarticulation with the basion is unchanged from comparison. Multilevel spondylitic changes are better detailed below. Soft tissues and spinal canal: No pre or paravertebral fluid or swelling. No visible canal hematoma. Cervical carotid atherosclerosis with a slightly medialized course of the right internal carotid artery. Disc levels: Multilevel intervertebral disc height loss with spondylitic endplate changes. Slightly larger disc osteophyte complexes are present C4-C7 with at most mild canal stenosis at the C6-7 level. Multilevel uncinate spurring and facet hypertrophic changes are present as well with mild multilevel neural foraminal narrowing and more moderate bilateral foraminal impingement at C5-6 and C6-7. Upper chest: Stable calcified granuloma seen in the right lung apex. No acute abnormality in the upper chest or imaged lung apices. Some mild calcification in the visible proximal  great vessels. Other: Normal thyroid gland. IMPRESSION: 1. Minimal left frontal scalp swelling without large hematoma or calvarial fracture. No acute intracranial abnormality. 2. Stable mild-to-moderate parenchymal volume loss and chronic microvascular ischemic white matter disease. 3. Left infraorbital and malar soft tissue swelling without soft tissue gas or foreign body. 4. No acute facial bone fracture or orbital injury identified. 5. No acute cervical spine fracture or traumatic listhesis. 6. Multilevel degenerative changes of the cervical spine as described above. 7. Cervical and intracranial atherosclerosis. Electronically Signed   By: Lovena Le M.D.   On: 09/20/2019 18:36   DG Chest Port 1 View  Result Date: 09/20/2019 CLINICAL DATA:  Pt states she fell while visiting friend. Per notes- syncopal episode-complaining of left knee pain EXAM: PORTABLE CHEST 1 VIEW COMPARISON:  06/17/2017. FINDINGS: Cardiac silhouette is normal in size. No mediastinal or hilar masses. Linear opacities are noted at both lung bases consistent with atelectasis, similar to the prior chest radiograph and accentuated by low lung volumes. Remainder of the lungs is clear. No convincing pleural effusion and no pneumothorax. Spine fusion/stabilization hardware with pedicle screws and interconnecting rods. Interconnecting rods appear disrupted or unconnected, on the right at the L1-L2 level on the on the left closer to the L2-L3 level. IMPRESSION: 1. No acute cardiopulmonary disease. 2. Linear lung base opacities consistent with atelectasis. No convincing pneumonia or pulmonary edema. 3. Questionable disruption of interconnecting rods along the thoracolumbar fusion. Electronically Signed   By: Lajean Manes M.D.   On: 09/20/2019 18:18   DG Knee Complete 4 Views Left  Result Date: 09/20/2019 CLINICAL DATA:  Pt states she fell while visiting friend. Per notes- syncopal episode-complaining of left knee pain EXAM: LEFT KNEE - COMPLETE 4+ VIEW COMPARISON:  05/18/2016 FINDINGS: No fracture or bone lesion. Moderate medial joint space compartment narrowing associated marginal osteophytes. Small marginal osteophytes from patella. Lateral compartment is relatively well preserved. No joint effusion. Surrounding soft tissues are unremarkable. IMPRESSION: 1. No fracture or acute finding. 2. Moderate osteoarthritis. Electronically Signed   By: Lajean Manes M.D.   On: 09/20/2019 18:14   ECHOCARDIOGRAM COMPLETE  Result Date: 09/21/2019    ECHOCARDIOGRAM REPORT   Patient Name:   Melinda Hartman Date of Exam: 09/21/2019 Medical Rec #:  [id number]     Height:       64.0 in Accession #:     [id number]    Weight:       182.0 lb Date of Birth:  [birth year]    BSA:          1.880 m Patient Age:    89 years      BP:           143/100 mmHg Patient Gender: F             HR:           70 bpm. Exam Location:  Inpatient Procedure: 2D Echo Indications:    syncope  History:        Patient has no prior history of Echocardiogram examinations.                 COPD; Risk Factors:Hypertension.  Sonographer:    Jannett Celestine RDCS (AE) Referring Phys: [id number] [facility]  Sonographer Comments: apical windows were very challenging due to body habitus and required repositioning on multiple views. IMPRESSIONS  1. Left ventricular ejection fraction, by estimation, is 60 to 65%. The left ventricle has normal function. The left ventricle has no  regional wall motion abnormalities. Left ventricular diastolic parameters are consistent with Grade I diastolic dysfunction (impaired relaxation).  2. Right ventricular systolic function is normal. The right ventricular size is normal.  3. Left atrial size was moderately dilated.  4. The mitral valve is normal in structure. Trivial mitral valve regurgitation. No evidence of mitral stenosis.  5. The aortic valve was not well visualized. Aortic valve regurgitation is not visualized. No aortic stenosis is present.  6. The inferior vena cava is normal in size with greater than 50% respiratory variability, suggesting right atrial pressure of 3 mmHg. FINDINGS  Left Ventricle: Left ventricular ejection fraction, by estimation, is 60 to 65%. The left ventricle has normal function. The left ventricle has no regional wall motion abnormalities. The left ventricular internal cavity size was normal in size. There is  no left ventricular hypertrophy. Left ventricular diastolic parameters are consistent with Grade I diastolic dysfunction (impaired relaxation). Normal left ventricular filling pressure. Right Ventricle: The right ventricular size is normal. No increase in right ventricular wall  thickness. Right ventricular systolic function is normal. Left Atrium: Left atrial size was moderately dilated. Right Atrium: Right atrial size was normal in size. Pericardium: There is no evidence of pericardial effusion. Mitral Valve: The mitral valve is normal in structure. Normal mobility of the mitral valve leaflets. Trivial mitral valve regurgitation. No evidence of mitral valve stenosis. Tricuspid Valve: The tricuspid valve is normal in structure. Tricuspid valve regurgitation is not demonstrated. No evidence of tricuspid stenosis. Aortic Valve: The aortic valve was not well visualized. Aortic valve regurgitation is not visualized. No aortic stenosis is present. Pulmonic Valve: The pulmonic valve was not well visualized. Pulmonic valve regurgitation is not visualized. No evidence of pulmonic stenosis. Aorta: The aortic root is normal in size and structure. Venous: The inferior vena cava is normal in size with greater than 50% respiratory variability, suggesting right atrial pressure of 3 mmHg. IAS/Shunts: No atrial level shunt detected by color flow Doppler.  LEFT VENTRICLE PLAX 2D LVIDd:         4.00 cm  Diastology LVIDs:         2.40 cm  LV e' lateral:   6.85 cm/s LV PW:         1.20 cm  LV E/e' lateral: 10.6 LV IVS:        1.00 cm  LV e' medial:    7.72 cm/s LVOT diam:     2.00 cm  LV E/e' medial:  9.4 LV SV:         40 LV SV Index:   21 LVOT Area:     3.14 cm  LEFT ATRIUM         Index LA diam:    4.70 cm 2.50 cm/m  AORTIC VALVE LVOT Vmax:   64.60 cm/s LVOT Vmean:  45.100 cm/s LVOT VTI:    0.128 m  AORTA Ao Root diam: 2.70 cm MITRAL VALVE MV Area (PHT): 2.37 cm    SHUNTS MV Decel Time: 320 msec    Systemic VTI:  0.13 m MV E velocity: 72.40 cm/s  Systemic Diam: 2.00 cm MV A velocity: 98.50 cm/s MV E/A ratio:  0.74 Mihai Croitoru MD Electronically signed by Sanda Klein MD Signature Date/Time: 09/21/2019/4:30:04 PM    Final    CT MAXILLOFACIAL WO CONTRAST  Result Date: 09/20/2019 CLINICAL DATA:  Fall  and syncopal episode, left cheek pain and infraorbital bruising EXAM: CT HEAD WITHOUT CONTRAST CT MAXILLOFACIAL WITHOUT CONTRAST CT CERVICAL SPINE WITHOUT  CONTRAST TECHNIQUE: Multidetector CT imaging of the head, cervical spine, and maxillofacial structures were performed using the standard protocol without intravenous contrast. Multiplanar CT image reconstructions of the cervical spine and maxillofacial structures were also generated. COMPARISON:  MRI 06/17/2017, CT head and cervical spine 06/17/2017 FINDINGS: CT HEAD FINDINGS Brain: No evidence of acute infarction, hemorrhage, hydrocephalus, extra-axial collection or mass lesion/mass effect. Basal cisterns are patent. Midline intracranial structures are normal. Cerebellar tonsils are normally positioned. Symmetric prominence of the ventricles, cisterns and sulci compatible with parenchymal volume loss. Patchy areas of white matter hypoattenuation are most compatible with mild-to-moderate chronic microvascular angiopathy. Benign dural calcifications are similar to priors. Vascular: Atherosclerotic calcification of the carotid siphons. No hyperdense vessel. Skull: Minimal left frontal scalp swelling without large hematoma or calvarial fracture. No other acute or suspicious osseous abnormality Other: None. CT MAXILLOFACIAL FINDINGS Osseous: No fracture of the bony orbits. Nasal bones are intact. No other mid face fractures are seen. The pterygoid plates are intact. No visible or suspected temporal bone fractures. Temporomandibular joints are normally aligned. The mandible is intact. No fractured or avulsed teeth. Orbits: Left infraorbital soft tissue swelling. No retro septal gas, stranding or hemorrhage. Prior bilateral lens extractions. Globes appear otherwise normal and symmetric. Symmetric appearance of the extraocular musculature and optic nerve sheath complexes. Normal caliber of the superior ophthalmic veins. Sinuses: Paranasal sinuses and mastoid air cells  are predominantly clear. Middle ear cavities are clear. Ossicular chains are normally configured. Soft tissues: Left infraorbital and malar soft tissue swelling without soft tissue gas or foreign body. Remote soft tissue thickening and radiodensity in the subcutaneous tissues of the superior nasal bridge unchanged from prior and may reflect some chronic scarring. No other significant soft tissue abnormalities. CT CERVICAL SPINE FINDINGS Alignment: Stabilization collar is in place at the time of examination. There is mild straightening of the normal cervical lordosis which is slightly more pronounced than on the comparison imaging in the lower cervical spine possibly related to positioning, stabilization or potential muscle spasm. Stepwise retrolisthesis C5-C7 and anterolisthesis C7 on T1 are grossly unchanged from comparison and favored to be on a degenerative, spondylitic basis. No evidence of traumatic listhesis. No abnormally widened, perched or jumped facets. Normal alignment of the craniocervical and atlantoaxial articulations. Skull base and vertebrae: No acute skull base fracture. No vertebral body fracture or height loss. Normal bone mineralization. No worrisome osseous lesions. Moderate atlantodental arthrosis with periarticular spurring and pseudoarticulation with the basion is unchanged from comparison. Multilevel spondylitic changes are better detailed below. Soft tissues and spinal canal: No pre or paravertebral fluid or swelling. No visible canal hematoma. Cervical carotid atherosclerosis with a slightly medialized course of the right internal carotid artery. Disc levels: Multilevel intervertebral disc height loss with spondylitic endplate changes. Slightly larger disc osteophyte complexes are present C4-C7 with at most mild canal stenosis at the C6-7 level. Multilevel uncinate spurring and facet hypertrophic changes are present as well with mild multilevel neural foraminal narrowing and more moderate  bilateral foraminal impingement at C5-6 and C6-7. Upper chest: Stable calcified granuloma seen in the right lung apex. No acute abnormality in the upper chest or imaged lung apices. Some mild calcification in the visible proximal great vessels. Other: Normal thyroid gland. IMPRESSION: 1. Minimal left frontal scalp swelling without large hematoma or calvarial fracture. No acute intracranial abnormality. 2. Stable mild-to-moderate parenchymal volume loss and chronic microvascular ischemic white matter disease. 3. Left infraorbital and malar soft tissue swelling without soft tissue gas or foreign body.  4. No acute facial bone fracture or orbital injury identified. 5. No acute cervical spine fracture or traumatic listhesis. 6. Multilevel degenerative changes of the cervical spine as described above. 7. Cervical and intracranial atherosclerosis. Electronically Signed   By: Lovena Le M.D.   On: 09/20/2019 18:36       The results of significant diagnostics from this hospitalization (including imaging, microbiology, ancillary and laboratory) are listed below for reference.     Microbiology: Recent Results (from the past 240 hour(s))  Blood culture (routine x 2)     Status: None (Preliminary result)   Collection Time: 09/20/19  5:30 PM   Specimen: BLOOD  Result Value Ref Range Status   Specimen Description   Final    BLOOD RIGHT ANTECUBITAL Performed at [facility] [address] [place], [place] [address]    Special Requests   Final    BOTTLES DRAWN AEROBIC AND ANAEROBIC Blood Culture adequate volume Performed at [facility] [address], [place], [place] [address]    Culture   Final    NO GROWTH < 12 HOURS Performed at [facility] [address] [place], [place] [address]    Report Status PENDING  Incomplete  SARS Coronavirus 2 by RT PCR (hospital order, performed in [facility] hospital lab) Nasopharyngeal Nasopharyngeal Swab     Status:  None   Collection Time: 09/21/19  3:25 PM   Specimen: Nasopharyngeal Swab  Result Value Ref Range Status   SARS Coronavirus 2 NEGATIVE NEGATIVE Final    Comment: (NOTE) SARS-CoV-2 target nucleic acids are NOT DETECTED.  The SARS-CoV-2 RNA is generally detectable in upper and lower respiratory specimens during the acute phase of infection. The lowest concentration of SARS-CoV-2 viral copies this assay can detect is 250 copies / mL. A negative result does not preclude SARS-CoV-2 infection and should not be used as the sole basis for treatment or other patient management decisions.  A negative result may occur with improper specimen collection / handling, submission of specimen other than nasopharyngeal swab, presence of viral mutation(s) within the areas targeted by this assay, and inadequate number of viral copies (<250 copies / mL). A negative result must be combined with clinical observations, patient history, and epidemiological information.  Fact Sheet for Patients:   StrictlyIdeas.no  Fact Sheet for Healthcare Providers: BankingDealers.co.za  This test is not yet approved or  cleared by the [place] FDA and has been authorized for detection and/or diagnosis of SARS-CoV-2 by FDA under an Emergency Use Authorization (EUA).  This EUA will remain in effect (meaning this test can be used) for the duration of the COVID-19 declaration under Section 564(b)(1) of the Act, 21 U.S.C. section 360bbb-3(b)(1), unless the authorization is terminated or revoked sooner.  Performed at [facility], [place] [address], [place], [place] [address]      Labs: BNP (last 3 results) No results for input(s): BNP in the last 8760 hours. Basic Metabolic Panel: Recent Labs  Lab 09/20/19 1720 09/21/19 0432 09/22/19 0528  NA 139 142 142  K 3.7 3.3* 3.5  CL 104 107 108  CO2 26 27 25   GLUCOSE 99 105* 103*  BUN 16 14 14     CREATININE 0.77 0.62 0.70  CALCIUM 9.9 8.7* 8.8*  MG  --  2.3 2.2  PHOS  --   --  4.2   Liver Function Tests: Recent Labs  Lab 09/20/19 1720 09/22/19 0528  AST 17  --  ALT 18  --   ALKPHOS 102  --   BILITOT 0.4  --   PROT 6.9  --   ALBUMIN 4.2 3.4*   No results for input(s): LIPASE, AMYLASE in the last 168 hours. No results for input(s): AMMONIA in the last 168 hours. CBC: Recent Labs  Lab 09/20/19 1720 09/21/19 0432  WBC 9.4 7.6  HGB 13.6 12.5  HCT 42.4 38.7  MCV 96.4 96.0  PLT 329 284   Cardiac Enzymes: No results for input(s): CKTOTAL, CKMB, CKMBINDEX, TROPONINI in the last 168 hours. BNP: Invalid input(s): POCBNP CBG: No results for input(s): GLUCAP in the last 168 hours. D-Dimer No results for input(s): DDIMER in the last 72 hours. Hgb A1c No results for input(s): HGBA1C in the last 72 hours. Lipid Profile No results for input(s): CHOL, HDL, LDLCALC, TRIG, CHOLHDL, LDLDIRECT in the last 72 hours. Thyroid function studies No results for input(s): TSH, T4TOTAL, T3FREE, THYROIDAB in the last 72 hours.  Invalid input(s): FREET3 Anemia work up No results for input(s): VITAMINB12, FOLATE, FERRITIN, TIBC, IRON, RETICCTPCT in the last 72 hours. Urinalysis    Component Value Date/Time   COLORURINE YELLOW 09/20/2019 2300   APPEARANCEUR HAZY (A) 09/20/2019 2300   LABSPEC 1.013 09/20/2019 2300   PHURINE 5.0 09/20/2019 2300   GLUCOSEU NEGATIVE 09/20/2019 2300   HGBUR SMALL (A) 09/20/2019 2300   BILIRUBINUR NEGATIVE 09/20/2019 2300   KETONESUR NEGATIVE 09/20/2019 2300   PROTEINUR 30 (A) 09/20/2019 2300   NITRITE NEGATIVE 09/20/2019 2300   LEUKOCYTESUR LARGE (A) 09/20/2019 2300   Sepsis Labs Invalid input(s): PROCALCITONIN,  WBC,  LACTICIDVEN   Time coordinating discharge: 35 minutes  SIGNED:  Mercy Riding, MD  [facility] 09/22/2019, 9:55 AM  If 7PM-7AM, please contact night-coverage [email or web address]

## 2019-09-22 NOTE — Plan of Care (Signed)
  Problem: Education: Goal: Knowledge of General Education information will improve Description: Including pain rating scale, medication(s)/side effects and non-pharmacologic comfort measures 09/22/2019 0311 by Anda Kraft, RN Outcome: Progressing 09/22/2019 0310 by Anda Kraft, RN Outcome: Progressing   Problem: Activity: Goal: Risk for activity intolerance will decrease 09/22/2019 0311 by Anda Kraft, RN Outcome: Progressing 09/22/2019 0310 by Anda Kraft, RN Outcome: Progressing   Problem: Pain Managment: Goal: General experience of comfort will improve 09/22/2019 0311 by Anda Kraft, RN Outcome: Progressing 09/22/2019 0310 by Anda Kraft, RN Outcome: Progressing   Problem: Safety: Goal: Ability to remain free from injury will improve 09/22/2019 0311 by Anda Kraft, RN Outcome: Progressing 09/22/2019 0310 by Anda Kraft, RN Outcome: Progressing   Problem: Skin Integrity: Goal: Risk for impaired skin integrity will decrease Outcome: Progressing

## 2019-09-22 NOTE — Progress Notes (Signed)
Occupational Therapy Evaluation  Patient demonstrate independence with self care and functional transfers during evaluation, patient appears at her baseline. No further acute OT indicated at this time, will sign off. Please re-consult if any new needs arise.    09/22/19 0820  OT Visit Information  Last OT Received On 09/22/19  Assistance Needed +1  History of Present Illness Patient is a 79 year old female with history of hypertension, hyperlipidemia, asthma, anxiety and depression was brought to the ER after patient had a syncopal episode. Patient has been having recurrent UTI and has been on Keflex for last 1 month.  Precautions  Precautions Fall  Restrictions  Weight Bearing Restrictions No  Home Living  Family/patient expects to be discharged to: Private residence  Living Arrangements Spouse/significant other  Available Help at Discharge Family  Type of [place] to enter  Entrance Stairs-Number of Steps 1 step onto porch, no steps in back  [place] Two level;Able to live on main level with bedroom/bathroom  Bathroom Shower/Tub Walk-in shower  [place] - 2 wheels;Cane - single point;Grab bars - tub/shower;Shower seat  Prior Function  Level of Independence Independent with assistive device(s)  Comments cane  Communication  Communication HOH  Cognition  Arousal/Alertness Lethargic;Awake/alert  Behavior During Therapy WFL for tasks assessed/performed  Overall Cognitive Status Within Functional Limits for tasks assessed  General Comments oriented x4, initially groggy in bed falling asleep during questions however with mobility patient much more alert  Upper Extremity Assessment  Upper Extremity Assessment Overall WFL for tasks assessed  Lower Extremity Assessment  Lower Extremity Assessment Defer to PT evaluation  Cervical / Trunk Assessment  Cervical / Trunk Assessment Normal  ADL  Overall ADL's   Independent  General ADL Comments patient able to ambulate to bathroom to use toilet, perform clothing + hygiene management, stood at sink for hand hygiene and transfer to recliner without physical assistance. denies any dizziness  Bed Mobility  Overal bed mobility Independent  Transfers  Overall transfer level Independent  Balance  Overall balance assessment Mild deficits observed, not formally tested  OT - End of Session  Activity Tolerance Patient tolerated treatment well  Patient left in chair;with call bell/phone within reach;with chair alarm set  Nurse Communication Mobility status  OT Assessment  OT Recommendation/Assessment Patient does not need any further OT services  OT Visit Diagnosis History of falling (Z91.81)  OT Problem List Impaired balance (sitting and/or standing)  AM-PAC OT "6 Clicks" Daily Activity Outcome Measure (Version 2)  Help from another person eating meals? 4  Help from another person taking care of personal grooming? 4  Help from another person toileting, which includes using toliet, bedpan, or urinal? 4  Help from another person bathing (including washing, rinsing, drying)? 4  Help from another person to put on and taking off regular upper body clothing? 4  Help from another person to put on and taking off regular lower body clothing? 4  6 Click Score 24  OT Recommendation  Follow Up Recommendations No OT follow up  OT Equipment None recommended by OT  OT Time Calculation  OT Start Time (ACUTE ONLY) 0741  OT Stop Time (ACUTE ONLY) 0240  OT Time Calculation (min) 33 min  OT General Charges  $OT Visit 1 Visit  OT Evaluation  $OT Eval Moderate Complexity 1 Mod  Written Expression  Dominant Hand Right   Delbert Phenix OT OT pager: [phone]

## 2019-09-22 NOTE — Progress Notes (Signed)
Progress Note  Patient Name: Melinda Hartman Date of Encounter: 09/22/2019  [facility] Cardiologist: No primary care provider on file.   Subjective   No further syncope.  Denies chest pain or dyspnea.  Inpatient Medications    Scheduled Meds: . cholecalciferol  1,000 Units Oral Daily  . clonazePAM  1 mg Oral BID  . DULoxetine  30 mg Oral Daily  . ezetimibe  10 mg Oral Daily  . irbesartan  300 mg Oral Daily  . montelukast  10 mg Oral Daily  . oxybutynin  5 mg Oral Daily  . pantoprazole  40 mg Oral Daily  . potassium chloride SA  20 mEq Oral Daily   Continuous Infusions: . sodium chloride 10 mL/hr at 09/22/19 0600  . cefTRIAXone (ROCEPHIN)  IV 1 g (09/22/19 0247)   PRN Meds: sodium chloride, gabapentin, nitroGLYCERIN, ondansetron **OR** ondansetron (ZOFRAN) IV   Vital Signs    Vitals:   09/21/19 2216 09/22/19 0157 09/22/19 0541 09/22/19 0645  BP: (!) 143/75 (!) 162/73 (!) 182/69 (!) 158/98  Pulse: 79 77 78   Resp: 16 18 18    Temp: 98.5 F (36.9 C) 98.2 F (36.8 C) 97.8 F (36.6 C)   TempSrc: Oral Oral Oral   SpO2: 94% 92% 100%   Weight:      Height:        Intake/Output Summary (Last 24 hours) at 09/22/2019 0845 Last data filed at 09/22/2019 0600 Gross per 24 hour  Intake 134.88 ml  Output 450 ml  Net -315.12 ml   Last 3 Weights 09/21/2019 10/10/2018 03/06/2018  Weight (lbs) 178 lb 5.6 oz 182 lb 178 lb 4 oz  Weight (kg) 80.9 kg 82.555 kg 80.854 kg      Telemetry    NSR rate 80s - Personally Reviewed  ECG    No new ECG - Personally Reviewed  Physical Exam   GEN: No acute distress.   Neck: No JVD Cardiac: RRR, no murmurs, rubs, or gallops.  Respiratory: Clear to auscultation bilaterally. GI: Soft, nontender, non-distended  MS: No edema; No deformity. Neuro:  Nonfocal  Psych: Normal affect   Labs    High Sensitivity Troponin:  No results for input(s): TROPONINIHS in the last 720 hours.    Chemistry Recent Labs  Lab 09/20/19 1720  09/21/19 0432 09/22/19 0528  NA 139 142 142  K 3.7 3.3* 3.5  CL 104 107 108  CO2 26 27 25   GLUCOSE 99 105* 103*  BUN 16 14 14   CREATININE 0.77 0.62 0.70  CALCIUM 9.9 8.7* 8.8*  PROT 6.9  --   --   ALBUMIN 4.2  --  3.4*  AST 17  --   --   ALT 18  --   --   ALKPHOS 102  --   --   BILITOT 0.4  --   --   GFRNONAA >60 >60 >60  GFRAA >60 >60 >60  ANIONGAP 9 8 9      Hematology Recent Labs  Lab 09/20/19 1720 09/21/19 0432  WBC 9.4 7.6  RBC 4.40 4.03  HGB 13.6 12.5  HCT 42.4 38.7  MCV 96.4 96.0  MCH 30.9 31.0  MCHC 32.1 32.3  RDW 13.3 13.2  PLT 329 284    BNPNo results for input(s): BNP, PROBNP in the last 168 hours.   DDimer No results for input(s): DDIMER in the last 168 hours.   Radiology    CT Head Wo Contrast  Result Date: 09/20/2019 CLINICAL DATA:  Fall  and syncopal episode, left cheek pain and infraorbital bruising EXAM: CT HEAD WITHOUT CONTRAST CT MAXILLOFACIAL WITHOUT CONTRAST CT CERVICAL SPINE WITHOUT CONTRAST TECHNIQUE: Multidetector CT imaging of the head, cervical spine, and maxillofacial structures were performed using the standard protocol without intravenous contrast. Multiplanar CT image reconstructions of the cervical spine and maxillofacial structures were also generated. COMPARISON:  MRI 06/17/2017, CT head and cervical spine 06/17/2017 FINDINGS: CT HEAD FINDINGS Brain: No evidence of acute infarction, hemorrhage, hydrocephalus, extra-axial collection or mass lesion/mass effect. Basal cisterns are patent. Midline intracranial structures are normal. Cerebellar tonsils are normally positioned. Symmetric prominence of the ventricles, cisterns and sulci compatible with parenchymal volume loss. Patchy areas of white matter hypoattenuation are most compatible with mild-to-moderate chronic microvascular angiopathy. Benign dural calcifications are similar to priors. Vascular: Atherosclerotic calcification of the carotid siphons. No hyperdense vessel. Skull: Minimal  left frontal scalp swelling without large hematoma or calvarial fracture. No other acute or suspicious osseous abnormality Other: None. CT MAXILLOFACIAL FINDINGS Osseous: No fracture of the bony orbits. Nasal bones are intact. No other mid face fractures are seen. The pterygoid plates are intact. No visible or suspected temporal bone fractures. Temporomandibular joints are normally aligned. The mandible is intact. No fractured or avulsed teeth. Orbits: Left infraorbital soft tissue swelling. No retro septal gas, stranding or hemorrhage. Prior bilateral lens extractions. Globes appear otherwise normal and symmetric. Symmetric appearance of the extraocular musculature and optic nerve sheath complexes. Normal caliber of the superior ophthalmic veins. Sinuses: Paranasal sinuses and mastoid air cells are predominantly clear. Middle ear cavities are clear. Ossicular chains are normally configured. Soft tissues: Left infraorbital and malar soft tissue swelling without soft tissue gas or foreign body. Remote soft tissue thickening and radiodensity in the subcutaneous tissues of the superior nasal bridge unchanged from prior and may reflect some chronic scarring. No other significant soft tissue abnormalities. CT CERVICAL SPINE FINDINGS Alignment: Stabilization collar is in place at the time of examination. There is mild straightening of the normal cervical lordosis which is slightly more pronounced than on the comparison imaging in the lower cervical spine possibly related to positioning, stabilization or potential muscle spasm. Stepwise retrolisthesis C5-C7 and anterolisthesis C7 on T1 are grossly unchanged from comparison and favored to be on a degenerative, spondylitic basis. No evidence of traumatic listhesis. No abnormally widened, perched or jumped facets. Normal alignment of the craniocervical and atlantoaxial articulations. Skull base and vertebrae: No acute skull base fracture. No vertebral body fracture or height  loss. Normal bone mineralization. No worrisome osseous lesions. Moderate atlantodental arthrosis with periarticular spurring and pseudoarticulation with the basion is unchanged from comparison. Multilevel spondylitic changes are better detailed below. Soft tissues and spinal canal: No pre or paravertebral fluid or swelling. No visible canal hematoma. Cervical carotid atherosclerosis with a slightly medialized course of the right internal carotid artery. Disc levels: Multilevel intervertebral disc height loss with spondylitic endplate changes. Slightly larger disc osteophyte complexes are present C4-C7 with at most mild canal stenosis at the C6-7 level. Multilevel uncinate spurring and facet hypertrophic changes are present as well with mild multilevel neural foraminal narrowing and more moderate bilateral foraminal impingement at C5-6 and C6-7. Upper chest: Stable calcified granuloma seen in the right lung apex. No acute abnormality in the upper chest or imaged lung apices. Some mild calcification in the visible proximal great vessels. Other: Normal thyroid gland. IMPRESSION: 1. Minimal left frontal scalp swelling without large hematoma or calvarial fracture. No acute intracranial abnormality. 2. Stable mild-to-moderate parenchymal volume loss  and chronic microvascular ischemic white matter disease. 3. Left infraorbital and malar soft tissue swelling without soft tissue gas or foreign body. 4. No acute facial bone fracture or orbital injury identified. 5. No acute cervical spine fracture or traumatic listhesis. 6. Multilevel degenerative changes of the cervical spine as described above. 7. Cervical and intracranial atherosclerosis. Electronically Signed   By: Lovena Le M.D.   On: 09/20/2019 18:36   CT Cervical Spine Wo Contrast  Result Date: 09/20/2019 CLINICAL DATA:  Fall and syncopal episode, left cheek pain and infraorbital bruising EXAM: CT HEAD WITHOUT CONTRAST CT MAXILLOFACIAL WITHOUT CONTRAST CT  CERVICAL SPINE WITHOUT CONTRAST TECHNIQUE: Multidetector CT imaging of the head, cervical spine, and maxillofacial structures were performed using the standard protocol without intravenous contrast. Multiplanar CT image reconstructions of the cervical spine and maxillofacial structures were also generated. COMPARISON:  MRI 06/17/2017, CT head and cervical spine 06/17/2017 FINDINGS: CT HEAD FINDINGS Brain: No evidence of acute infarction, hemorrhage, hydrocephalus, extra-axial collection or mass lesion/mass effect. Basal cisterns are patent. Midline intracranial structures are normal. Cerebellar tonsils are normally positioned. Symmetric prominence of the ventricles, cisterns and sulci compatible with parenchymal volume loss. Patchy areas of white matter hypoattenuation are most compatible with mild-to-moderate chronic microvascular angiopathy. Benign dural calcifications are similar to priors. Vascular: Atherosclerotic calcification of the carotid siphons. No hyperdense vessel. Skull: Minimal left frontal scalp swelling without large hematoma or calvarial fracture. No other acute or suspicious osseous abnormality Other: None. CT MAXILLOFACIAL FINDINGS Osseous: No fracture of the bony orbits. Nasal bones are intact. No other mid face fractures are seen. The pterygoid plates are intact. No visible or suspected temporal bone fractures. Temporomandibular joints are normally aligned. The mandible is intact. No fractured or avulsed teeth. Orbits: Left infraorbital soft tissue swelling. No retro septal gas, stranding or hemorrhage. Prior bilateral lens extractions. Globes appear otherwise normal and symmetric. Symmetric appearance of the extraocular musculature and optic nerve sheath complexes. Normal caliber of the superior ophthalmic veins. Sinuses: Paranasal sinuses and mastoid air cells are predominantly clear. Middle ear cavities are clear. Ossicular chains are normally configured. Soft tissues: Left infraorbital and  malar soft tissue swelling without soft tissue gas or foreign body. Remote soft tissue thickening and radiodensity in the subcutaneous tissues of the superior nasal bridge unchanged from prior and may reflect some chronic scarring. No other significant soft tissue abnormalities. CT CERVICAL SPINE FINDINGS Alignment: Stabilization collar is in place at the time of examination. There is mild straightening of the normal cervical lordosis which is slightly more pronounced than on the comparison imaging in the lower cervical spine possibly related to positioning, stabilization or potential muscle spasm. Stepwise retrolisthesis C5-C7 and anterolisthesis C7 on T1 are grossly unchanged from comparison and favored to be on a degenerative, spondylitic basis. No evidence of traumatic listhesis. No abnormally widened, perched or jumped facets. Normal alignment of the craniocervical and atlantoaxial articulations. Skull base and vertebrae: No acute skull base fracture. No vertebral body fracture or height loss. Normal bone mineralization. No worrisome osseous lesions. Moderate atlantodental arthrosis with periarticular spurring and pseudoarticulation with the basion is unchanged from comparison. Multilevel spondylitic changes are better detailed below. Soft tissues and spinal canal: No pre or paravertebral fluid or swelling. No visible canal hematoma. Cervical carotid atherosclerosis with a slightly medialized course of the right internal carotid artery. Disc levels: Multilevel intervertebral disc height loss with spondylitic endplate changes. Slightly larger disc osteophyte complexes are present C4-C7 with at most mild canal stenosis at the C6-7  level. Multilevel uncinate spurring and facet hypertrophic changes are present as well with mild multilevel neural foraminal narrowing and more moderate bilateral foraminal impingement at C5-6 and C6-7. Upper chest: Stable calcified granuloma seen in the right lung apex. No acute  abnormality in the upper chest or imaged lung apices. Some mild calcification in the visible proximal great vessels. Other: Normal thyroid gland. IMPRESSION: 1. Minimal left frontal scalp swelling without large hematoma or calvarial fracture. No acute intracranial abnormality. 2. Stable mild-to-moderate parenchymal volume loss and chronic microvascular ischemic white matter disease. 3. Left infraorbital and malar soft tissue swelling without soft tissue gas or foreign body. 4. No acute facial bone fracture or orbital injury identified. 5. No acute cervical spine fracture or traumatic listhesis. 6. Multilevel degenerative changes of the cervical spine as described above. 7. Cervical and intracranial atherosclerosis. Electronically Signed   By: Lovena Le M.D.   On: 09/20/2019 18:36   DG Chest Port 1 View  Result Date: 09/20/2019 CLINICAL DATA:  Pt states she fell while visiting friend. Per notes- syncopal episode-complaining of left knee pain EXAM: PORTABLE CHEST 1 VIEW COMPARISON:  06/17/2017. FINDINGS: Cardiac silhouette is normal in size. No mediastinal or hilar masses. Linear opacities are noted at both lung bases consistent with atelectasis, similar to the prior chest radiograph and accentuated by low lung volumes. Remainder of the lungs is clear. No convincing pleural effusion and no pneumothorax. Spine fusion/stabilization hardware with pedicle screws and interconnecting rods. Interconnecting rods appear disrupted or unconnected, on the right at the L1-L2 level on the on the left closer to the L2-L3 level. IMPRESSION: 1. No acute cardiopulmonary disease. 2. Linear lung base opacities consistent with atelectasis. No convincing pneumonia or pulmonary edema. 3. Questionable disruption of interconnecting rods along the thoracolumbar fusion. Electronically Signed   By: Lajean Manes M.D.   On: 09/20/2019 18:18   DG Knee Complete 4 Views Left  Result Date: 09/20/2019 CLINICAL DATA:  Pt states she fell while  visiting friend. Per notes- syncopal episode-complaining of left knee pain EXAM: LEFT KNEE - COMPLETE 4+ VIEW COMPARISON:  05/18/2016 FINDINGS: No fracture or bone lesion. Moderate medial joint space compartment narrowing associated marginal osteophytes. Small marginal osteophytes from patella. Lateral compartment is relatively well preserved. No joint effusion. Surrounding soft tissues are unremarkable. IMPRESSION: 1. No fracture or acute finding. 2. Moderate osteoarthritis. Electronically Signed   By: Lajean Manes M.D.   On: 09/20/2019 18:14   ECHOCARDIOGRAM COMPLETE  Result Date: 09/21/2019    ECHOCARDIOGRAM REPORT   Patient Name:   Melinda Hartman Date of Exam: 09/21/2019 Medical Rec #:  [id number]     Height:       64.0 in Accession #:    [id number]    Weight:       182.0 lb Date of Birth:  Oct 02, 1940    BSA:          1.880 m Patient Age:    33 years      BP:           143/100 mmHg Patient Gender: F             HR:           70 bpm. Exam Location:  Inpatient Procedure: 2D Echo Indications:    syncope  History:        Patient has no prior history of Echocardiogram examinations.                 COPD; Risk Factors:Hypertension.  Sonographer:    Jannett Celestine RDCS (AE) Referring Phys: [id number] [facility]  Sonographer Comments: apical windows were very challenging due to body habitus and required repositioning on multiple views. IMPRESSIONS  1. Left ventricular ejection fraction, by estimation, is 60 to 65%. The left ventricle has normal function. The left ventricle has no regional wall motion abnormalities. Left ventricular diastolic parameters are consistent with Grade I diastolic dysfunction (impaired relaxation).  2. Right ventricular systolic function is normal. The right ventricular size is normal.  3. Left atrial size was moderately dilated.  4. The mitral valve is normal in structure. Trivial mitral valve regurgitation. No evidence of mitral stenosis.  5. The aortic valve was not well visualized. Aortic  valve regurgitation is not visualized. No aortic stenosis is present.  6. The inferior vena cava is normal in size with greater than 50% respiratory variability, suggesting right atrial pressure of 3 mmHg. FINDINGS  Left Ventricle: Left ventricular ejection fraction, by estimation, is 60 to 65%. The left ventricle has normal function. The left ventricle has no regional wall motion abnormalities. The left ventricular internal cavity size was normal in size. There is  no left ventricular hypertrophy. Left ventricular diastolic parameters are consistent with Grade I diastolic dysfunction (impaired relaxation). Normal left ventricular filling pressure. Right Ventricle: The right ventricular size is normal. No increase in right ventricular wall thickness. Right ventricular systolic function is normal. Left Atrium: Left atrial size was moderately dilated. Right Atrium: Right atrial size was normal in size. Pericardium: There is no evidence of pericardial effusion. Mitral Valve: The mitral valve is normal in structure. Normal mobility of the mitral valve leaflets. Trivial mitral valve regurgitation. No evidence of mitral valve stenosis. Tricuspid Valve: The tricuspid valve is normal in structure. Tricuspid valve regurgitation is not demonstrated. No evidence of tricuspid stenosis. Aortic Valve: The aortic valve was not well visualized. Aortic valve regurgitation is not visualized. No aortic stenosis is present. Pulmonic Valve: The pulmonic valve was not well visualized. Pulmonic valve regurgitation is not visualized. No evidence of pulmonic stenosis. Aorta: The aortic root is normal in size and structure. Venous: The inferior vena cava is normal in size with greater than 50% respiratory variability, suggesting right atrial pressure of 3 mmHg. IAS/Shunts: No atrial level shunt detected by color flow Doppler.  LEFT VENTRICLE PLAX 2D LVIDd:         4.00 cm  Diastology LVIDs:         2.40 cm  LV e' lateral:   6.85 cm/s LV PW:          1.20 cm  LV E/e' lateral: 10.6 LV IVS:        1.00 cm  LV e' medial:    7.72 cm/s LVOT diam:     2.00 cm  LV E/e' medial:  9.4 LV SV:         40 LV SV Index:   21 LVOT Area:     3.14 cm  LEFT ATRIUM         Index LA diam:    4.70 cm 2.50 cm/m  AORTIC VALVE LVOT Vmax:   64.60 cm/s LVOT Vmean:  45.100 cm/s LVOT VTI:    0.128 m  AORTA Ao Root diam: 2.70 cm MITRAL VALVE MV Area (PHT): 2.37 cm    SHUNTS MV Decel Time: 320 msec    Systemic VTI:  0.13 m MV E velocity: 72.40 cm/s  Systemic Diam: 2.00 cm MV A velocity: 98.50 cm/s MV E/A ratio:  0.74 Sanda Klein MD Electronically signed by Sanda Klein MD Signature Date/Time: 09/21/2019/4:30:04 PM    Final    CT MAXILLOFACIAL WO CONTRAST  Result Date: 09/20/2019 CLINICAL DATA:  Fall and syncopal episode, left cheek pain and infraorbital bruising EXAM: CT HEAD WITHOUT CONTRAST CT MAXILLOFACIAL WITHOUT CONTRAST CT CERVICAL SPINE WITHOUT CONTRAST TECHNIQUE: Multidetector CT imaging of the head, cervical spine, and maxillofacial structures were performed using the standard protocol without intravenous contrast. Multiplanar CT image reconstructions of the cervical spine and maxillofacial structures were also generated. COMPARISON:  MRI 06/17/2017, CT head and cervical spine 06/17/2017 FINDINGS: CT HEAD FINDINGS Brain: No evidence of acute infarction, hemorrhage, hydrocephalus, extra-axial collection or mass lesion/mass effect. Basal cisterns are patent. Midline intracranial structures are normal. Cerebellar tonsils are normally positioned. Symmetric prominence of the ventricles, cisterns and sulci compatible with parenchymal volume loss. Patchy areas of white matter hypoattenuation are most compatible with mild-to-moderate chronic microvascular angiopathy. Benign dural calcifications are similar to priors. Vascular: Atherosclerotic calcification of the carotid siphons. No hyperdense vessel. Skull: Minimal left frontal scalp swelling without large hematoma or  calvarial fracture. No other acute or suspicious osseous abnormality Other: None. CT MAXILLOFACIAL FINDINGS Osseous: No fracture of the bony orbits. Nasal bones are intact. No other mid face fractures are seen. The pterygoid plates are intact. No visible or suspected temporal bone fractures. Temporomandibular joints are normally aligned. The mandible is intact. No fractured or avulsed teeth. Orbits: Left infraorbital soft tissue swelling. No retro septal gas, stranding or hemorrhage. Prior bilateral lens extractions. Globes appear otherwise normal and symmetric. Symmetric appearance of the extraocular musculature and optic nerve sheath complexes. Normal caliber of the superior ophthalmic veins. Sinuses: Paranasal sinuses and mastoid air cells are predominantly clear. Middle ear cavities are clear. Ossicular chains are normally configured. Soft tissues: Left infraorbital and malar soft tissue swelling without soft tissue gas or foreign body. Remote soft tissue thickening and radiodensity in the subcutaneous tissues of the superior nasal bridge unchanged from prior and may reflect some chronic scarring. No other significant soft tissue abnormalities. CT CERVICAL SPINE FINDINGS Alignment: Stabilization collar is in place at the time of examination. There is mild straightening of the normal cervical lordosis which is slightly more pronounced than on the comparison imaging in the lower cervical spine possibly related to positioning, stabilization or potential muscle spasm. Stepwise retrolisthesis C5-C7 and anterolisthesis C7 on T1 are grossly unchanged from comparison and favored to be on a degenerative, spondylitic basis. No evidence of traumatic listhesis. No abnormally widened, perched or jumped facets. Normal alignment of the craniocervical and atlantoaxial articulations. Skull base and vertebrae: No acute skull base fracture. No vertebral body fracture or height loss. Normal bone mineralization. No worrisome osseous  lesions. Moderate atlantodental arthrosis with periarticular spurring and pseudoarticulation with the basion is unchanged from comparison. Multilevel spondylitic changes are better detailed below. Soft tissues and spinal canal: No pre or paravertebral fluid or swelling. No visible canal hematoma. Cervical carotid atherosclerosis with a slightly medialized course of the right internal carotid artery. Disc levels: Multilevel intervertebral disc height loss with spondylitic endplate changes. Slightly larger disc osteophyte complexes are present C4-C7 with at most mild canal stenosis at the C6-7 level. Multilevel uncinate spurring and facet hypertrophic changes are present as well with mild multilevel neural foraminal narrowing and more moderate bilateral foraminal impingement at C5-6 and C6-7. Upper chest: Stable calcified granuloma seen in the right lung apex. No acute abnormality in the upper chest or imaged lung apices. Some mild calcification  in the visible proximal great vessels. Other: Normal thyroid gland. IMPRESSION: 1. Minimal left frontal scalp swelling without large hematoma or calvarial fracture. No acute intracranial abnormality. 2. Stable mild-to-moderate parenchymal volume loss and chronic microvascular ischemic white matter disease. 3. Left infraorbital and malar soft tissue swelling without soft tissue gas or foreign body. 4. No acute facial bone fracture or orbital injury identified. 5. No acute cervical spine fracture or traumatic listhesis. 6. Multilevel degenerative changes of the cervical spine as described above. 7. Cervical and intracranial atherosclerosis. Electronically Signed   By: Lovena Le M.D.   On: 09/20/2019 18:36    Cardiac Studies   TTE 09/21/19: 1. Left ventricular ejection fraction, by estimation, is 60 to 65%. The  left ventricle has normal function. The left ventricle has no regional  wall motion abnormalities. Left ventricular diastolic parameters are  consistent with  Grade I diastolic  dysfunction (impaired relaxation).  2. Right ventricular systolic function is normal. The right ventricular  size is normal.  3. Left atrial size was moderately dilated.  4. The mitral valve is normal in structure. Trivial mitral valve  regurgitation. No evidence of mitral stenosis.  5. The aortic valve was not well visualized. Aortic valve regurgitation  is not visualized. No aortic stenosis is present.  6. The inferior vena cava is normal in size with greater than 50%  respiratory variability, suggesting right atrial pressure of 3 mmHg.   Patient Profile     79 y.o. female with history of hypertension, hyperlipidemia, asthma who presents with syncope  Assessment & Plan    Syncope - no arrhythmias on telemetry.  Negative orthostatics.  Echocardiogram yesterday shows normal biventricular function, no significant valvular disease. -Plan cardiac monitor on discharge.  Will f/u with Dr Percival Spanish as outpatient  HTN: on irbesartan  Recurrent UTIs: on ceftriaxone  HLD on zetia  For questions or updates, please contact [facility] Please consult [email or web address] for contact info under        Signed, Donato Heinz, MD  09/22/2019, 8:45 AM

## 2019-09-25 DIAGNOSIS — Z6833 Body mass index (BMI) 33.0-33.9, adult: Secondary | ICD-10-CM | POA: Diagnosis not present

## 2019-09-25 DIAGNOSIS — Z79899 Other long term (current) drug therapy: Secondary | ICD-10-CM | POA: Diagnosis not present

## 2019-09-25 DIAGNOSIS — I10 Essential (primary) hypertension: Secondary | ICD-10-CM | POA: Diagnosis not present

## 2019-09-25 DIAGNOSIS — R55 Syncope and collapse: Secondary | ICD-10-CM | POA: Diagnosis not present

## 2019-09-25 DIAGNOSIS — B373 Candidiasis of vulva and vagina: Secondary | ICD-10-CM | POA: Diagnosis not present

## 2019-09-25 DIAGNOSIS — Z6832 Body mass index (BMI) 32.0-32.9, adult: Secondary | ICD-10-CM | POA: Diagnosis not present

## 2019-09-25 DIAGNOSIS — N390 Urinary tract infection, site not specified: Secondary | ICD-10-CM | POA: Diagnosis not present

## 2019-09-25 LAB — CULTURE, BLOOD (ROUTINE X 2)
Culture: NO GROWTH
Special Requests: ADEQUATE

## 2019-09-27 DIAGNOSIS — R490 Dysphonia: Secondary | ICD-10-CM | POA: Diagnosis not present

## 2019-09-27 DIAGNOSIS — J383 Other diseases of vocal cords: Secondary | ICD-10-CM | POA: Diagnosis not present

## 2019-10-03 DIAGNOSIS — S39012A Strain of muscle, fascia and tendon of lower back, initial encounter: Secondary | ICD-10-CM | POA: Diagnosis not present

## 2019-10-03 DIAGNOSIS — M4316 Spondylolisthesis, lumbar region: Secondary | ICD-10-CM | POA: Diagnosis not present

## 2019-10-03 DIAGNOSIS — M4712 Other spondylosis with myelopathy, cervical region: Secondary | ICD-10-CM | POA: Diagnosis not present

## 2019-10-03 DIAGNOSIS — M960 Pseudarthrosis after fusion or arthrodesis: Secondary | ICD-10-CM | POA: Diagnosis not present

## 2019-10-03 DIAGNOSIS — M5030 Other cervical disc degeneration, unspecified cervical region: Secondary | ICD-10-CM | POA: Diagnosis not present

## 2019-10-03 DIAGNOSIS — M25562 Pain in left knee: Secondary | ICD-10-CM | POA: Diagnosis not present

## 2019-10-03 DIAGNOSIS — M4325 Fusion of spine, thoracolumbar region: Secondary | ICD-10-CM | POA: Diagnosis not present

## 2019-10-11 DIAGNOSIS — M960 Pseudarthrosis after fusion or arthrodesis: Secondary | ICD-10-CM | POA: Diagnosis not present

## 2019-10-11 DIAGNOSIS — Z981 Arthrodesis status: Secondary | ICD-10-CM | POA: Diagnosis not present

## 2019-10-11 DIAGNOSIS — M4325 Fusion of spine, thoracolumbar region: Secondary | ICD-10-CM | POA: Diagnosis not present

## 2019-10-11 DIAGNOSIS — M9971 Connective tissue and disc stenosis of intervertebral foramina of cervical region: Secondary | ICD-10-CM | POA: Diagnosis not present

## 2019-10-11 DIAGNOSIS — M4802 Spinal stenosis, cervical region: Secondary | ICD-10-CM | POA: Diagnosis not present

## 2019-10-11 DIAGNOSIS — M4712 Other spondylosis with myelopathy, cervical region: Secondary | ICD-10-CM | POA: Diagnosis not present

## 2019-10-17 DIAGNOSIS — N390 Urinary tract infection, site not specified: Secondary | ICD-10-CM | POA: Diagnosis not present

## 2019-10-18 DIAGNOSIS — D539 Nutritional anemia, unspecified: Secondary | ICD-10-CM | POA: Diagnosis not present

## 2019-10-18 DIAGNOSIS — R739 Hyperglycemia, unspecified: Secondary | ICD-10-CM | POA: Diagnosis not present

## 2019-10-18 DIAGNOSIS — I10 Essential (primary) hypertension: Secondary | ICD-10-CM | POA: Diagnosis not present

## 2019-10-18 DIAGNOSIS — Z01818 Encounter for other preprocedural examination: Secondary | ICD-10-CM | POA: Diagnosis not present

## 2019-10-18 DIAGNOSIS — E785 Hyperlipidemia, unspecified: Secondary | ICD-10-CM | POA: Diagnosis not present

## 2019-10-23 DIAGNOSIS — M5441 Lumbago with sciatica, right side: Secondary | ICD-10-CM | POA: Diagnosis not present

## 2019-10-23 DIAGNOSIS — E559 Vitamin D deficiency, unspecified: Secondary | ICD-10-CM | POA: Diagnosis not present

## 2019-10-23 DIAGNOSIS — F321 Major depressive disorder, single episode, moderate: Secondary | ICD-10-CM | POA: Diagnosis not present

## 2019-10-23 DIAGNOSIS — E785 Hyperlipidemia, unspecified: Secondary | ICD-10-CM | POA: Diagnosis not present

## 2019-10-23 DIAGNOSIS — F419 Anxiety disorder, unspecified: Secondary | ICD-10-CM | POA: Diagnosis not present

## 2019-10-23 DIAGNOSIS — Z23 Encounter for immunization: Secondary | ICD-10-CM | POA: Diagnosis not present

## 2019-10-23 DIAGNOSIS — D539 Nutritional anemia, unspecified: Secondary | ICD-10-CM | POA: Diagnosis not present

## 2019-10-23 DIAGNOSIS — M1990 Unspecified osteoarthritis, unspecified site: Secondary | ICD-10-CM | POA: Diagnosis not present

## 2019-10-23 DIAGNOSIS — M5442 Lumbago with sciatica, left side: Secondary | ICD-10-CM | POA: Diagnosis not present

## 2019-10-23 DIAGNOSIS — R739 Hyperglycemia, unspecified: Secondary | ICD-10-CM | POA: Diagnosis not present

## 2019-10-23 DIAGNOSIS — G8929 Other chronic pain: Secondary | ICD-10-CM | POA: Diagnosis not present

## 2019-10-23 DIAGNOSIS — I10 Essential (primary) hypertension: Secondary | ICD-10-CM | POA: Diagnosis not present

## 2019-10-26 DIAGNOSIS — N390 Urinary tract infection, site not specified: Secondary | ICD-10-CM | POA: Diagnosis not present

## 2019-10-31 DIAGNOSIS — M4712 Other spondylosis with myelopathy, cervical region: Secondary | ICD-10-CM | POA: Diagnosis not present

## 2019-10-31 DIAGNOSIS — Z981 Arthrodesis status: Secondary | ICD-10-CM | POA: Diagnosis not present

## 2019-10-31 DIAGNOSIS — M960 Pseudarthrosis after fusion or arthrodesis: Secondary | ICD-10-CM | POA: Diagnosis not present

## 2019-10-31 DIAGNOSIS — Y838 Other surgical procedures as the cause of abnormal reaction of the patient, or of later complication, without mention of misadventure at the time of the procedure: Secondary | ICD-10-CM | POA: Diagnosis not present

## 2019-11-02 DIAGNOSIS — Z1159 Encounter for screening for other viral diseases: Secondary | ICD-10-CM | POA: Diagnosis not present

## 2019-11-02 DIAGNOSIS — Z1152 Encounter for screening for COVID-19: Secondary | ICD-10-CM | POA: Diagnosis not present

## 2019-11-04 NOTE — Progress Notes (Deleted)
Cardiology Office Note   Date:  11/04/2019   ID:  Prudence Heiny, DOB [birth year], MRN [id number]  PCP:  [name], NP  Cardiologist:   Minus Breeding, MD   No chief complaint on file.     History of Present Illness: Melinda Hartman is a 79 y.o. female who presents for follow up of syncope.  We saw her for this in consultation when she was hospitalized.   She had an unremarkable echo.   ***     Past Medical History:  Diagnosis Date  . Acid reflux   . Anxiety   . Arthritis   . Asthma   . Cataracts, bilateral   . COPD (chronic obstructive pulmonary disease) ([place])   . H/O bladder problems   . Hypertension   . IBS (irritable bowel syndrome)   . Osteoporosis   . Pneumonia     Past Surgical History:  Procedure Laterality Date  . ABDOMINAL HYSTERECTOMY  1977  . APPENDECTOMY    . BACK SURGERY     x4 22 screws 2 rods  . COLONOSCOPY  04/11/2014   Mild diverticulosis. Otherwise normal colonoscopy. Highly redundant colon.   Marland Kitchen GALLBLADDER SURGERY  02/26/2017  . HERNIA REPAIR  02/26/2017  . KNEE ARTHROSCOPY Left      Current Outpatient Medications  Medication Sig Dispense Refill  . acetaminophen (TYLENOL) 325 MG tablet Take 325 mg by mouth every 6 (six) hours as needed for moderate pain.     Marland Kitchen aspirin EC 81 MG tablet Take 81 mg by mouth daily.     . celecoxib (CELEBREX) 100 MG capsule Take 100 mg by mouth 2 (two) times daily.    . cholecalciferol (VITAMIN D3) 25 MCG (1000 UNIT) tablet Take 1,000 Units by mouth daily.    . cholestyramine (QUESTRAN) 4 g packet Take once daily 2 hours before or after all other medications. (Patient not taking: Reported on 09/20/2019) 30 each 11  . clonazePAM (KLONOPIN) 0.5 MG tablet Take 1 mg by mouth 2 (two) times daily.     Marland Kitchen conjugated estrogens (PREMARIN) vaginal cream Place 1 Applicatorful vaginally See admin instructions. 3 times a week    . DULoxetine (CYMBALTA) 30 MG capsule Take 30 mg by mouth daily.    Marland Kitchen EPINEPHrine (EPIPEN 2-PAK)  0.3 mg/0.3 mL IJ SOAJ injection Inject 0.3 mg into the muscle as needed for anaphylaxis.     Marland Kitchen esomeprazole (NEXIUM) 40 MG capsule Take 40 mg by mouth 2 (two) times daily.    Marland Kitchen ezetimibe (ZETIA) 10 MG tablet Take 10 mg by mouth daily.    . fluticasone (CUTIVATE) 0.05 % cream Apply topically 2 (two) times daily. (Patient not taking: Reported on 09/20/2019) 120 g 1  . gabapentin (NEURONTIN) 300 MG capsule Take 600 mg by mouth 2 (two) times daily as needed (sometimes 3 times daily).    Marland Kitchen MICARDIS 40 MG tablet Take 40 mg by mouth daily.     . montelukast (SINGULAIR) 10 MG tablet Take 10 mg by mouth daily.     . nitroGLYCERIN (NITROSTAT) 0.4 MG SL tablet Place 0.4 mg under the tongue every 5 (five) minutes as needed for chest pain.     Marland Kitchen oxybutynin (DITROPAN-XL) 5 MG 24 hr tablet Take 5 mg by mouth daily.    . polyethylene glycol (MIRALAX / GLYCOLAX) packet Take 17 g by mouth daily as needed for mild constipation.     . potassium chloride SA (K-DUR,KLOR-CON) 20 MEQ tablet Take 20 mEq  by mouth daily.     . traMADol (ULTRAM) 50 MG tablet Take 50 mg by mouth every 12 (twelve) hours as needed for moderate pain.      No current facility-administered medications for this visit.    Allergies:   Caffeine, Haloperidol, Food, Nicotine, and Tape    ROS:  Please see the history of present illness.   Otherwise, review of systems are positive for {NONE DEFAULTED:18576::"none"}.   All other systems are reviewed and negative.    PHYSICAL EXAM: VS:  There were no vitals taken for this visit. , BMI There is no height or weight on file to calculate BMI. GENERAL:  Well appearing NECK:  No jugular venous distention, waveform within normal limits, carotid upstroke brisk and symmetric, no bruits, no thyromegaly LUNGS:  Clear to auscultation bilaterally BACK:  No CVA tenderness CHEST:  Unremarkable HEART:  PMI not displaced or sustained,S1 and S2 within normal limits, no S3, no S4, no clicks, no rubs, ***  murmurs ABD:  Flat, positive bowel sounds normal in frequency in pitch, no bruits, no rebound, no guarding, no midline pulsatile mass, no hepatomegaly, no splenomegaly EXT:  2 plus pulses throughout, no edema, no cyanosis no clubbing   EKG:  EKG {ACTION; IS/IS VOZ:36644034} ordered today. The ekg ordered today demonstrates ***   Recent Labs: 09/20/2019: ALT 18 09/21/2019: Hemoglobin 12.5; Platelets 284 09/22/2019: BUN 14; Creatinine, Ser 0.70; Magnesium 2.2; Potassium 3.5; Sodium 142    Lipid Panel No results found for: CHOL, TRIG, HDL, CHOLHDL, VLDL, LDLCALC, LDLDIRECT    Wt Readings from Last 3 Encounters:  09/21/19 178 lb 5.6 oz (80.9 kg)  10/10/18 182 lb (82.6 kg)  03/06/18 178 lb 4 oz (80.9 kg)      Other studies Reviewed: Additional studies/ records that were reviewed today include: ***. Review of the above records demonstrates:  Please see elsewhere in the note.  ***   ASSESSMENT AND PLAN:  Syncope:   ***   unclear etiology. No prodromes. No significant finding on EKG, telemetry and echocardiogram.  TSH and basic labs within normal.  She might be at risk for polypharmacy on Klonopin, tramadol and gabapentin.  She has UTI symptoms with concerning UA but notquite sure if this alone would explain her syncope. Orthostatic vitals negative. -Patient to follow-up with cardiology for 30-day event monitor  Hypokalemia:  ***  K improved to 3.5.  Received K-Dur prior to discharge.  Magnesium within normal.  Essential hypertension:   ***   BP slightly elevated. -Resume home Micardis  Hyperlipidemia:   ***  -Continue home Zetia   Current medicines are reviewed at length with the patient today.  The patient {ACTIONS; HAS/DOES NOT HAVE:19233} concerns regarding medicines.  The following changes have been made:  {PLAN; NO CHANGE:13088:s}  Labs/ tests ordered today include: *** No orders of the defined types were placed in this encounter.    Disposition:   FU with ***     Signed, Minus Breeding, MD  11/04/2019 3:51 PM    [facility]

## 2019-11-05 ENCOUNTER — Encounter: Payer: Self-pay | Admitting: *Deleted

## 2019-11-05 ENCOUNTER — Telehealth: Payer: Self-pay | Admitting: *Deleted

## 2019-11-05 NOTE — Telephone Encounter (Signed)
Patient declined 30 day cardiac event monitor stating she is seeing a doctor in the [place] area.   Informed patient she had an appointment scheduled 11/06/2019 with Dr. Percival Spanish.  Ms. Montante stated she had cancelled that appointment.

## 2019-11-05 NOTE — Telephone Encounter (Signed)
error 

## 2019-11-05 NOTE — Telephone Encounter (Signed)
Spoke with patient and confirmed she wanted to cancel appointment

## 2019-11-06 ENCOUNTER — Ambulatory Visit: Payer: Medicare Other | Admitting: Cardiology

## 2019-11-08 DIAGNOSIS — N3030 Trigonitis without hematuria: Secondary | ICD-10-CM | POA: Diagnosis not present

## 2019-11-08 DIAGNOSIS — Z7982 Long term (current) use of aspirin: Secondary | ICD-10-CM | POA: Diagnosis not present

## 2019-11-08 DIAGNOSIS — N3090 Cystitis, unspecified without hematuria: Secondary | ICD-10-CM | POA: Diagnosis not present

## 2019-11-08 DIAGNOSIS — M1990 Unspecified osteoarthritis, unspecified site: Secondary | ICD-10-CM | POA: Diagnosis not present

## 2019-11-08 DIAGNOSIS — Z79899 Other long term (current) drug therapy: Secondary | ICD-10-CM | POA: Diagnosis not present

## 2019-11-08 DIAGNOSIS — K219 Gastro-esophageal reflux disease without esophagitis: Secondary | ICD-10-CM | POA: Diagnosis not present

## 2019-11-08 DIAGNOSIS — I10 Essential (primary) hypertension: Secondary | ICD-10-CM | POA: Diagnosis not present

## 2019-11-08 DIAGNOSIS — Z8673 Personal history of transient ischemic attack (TIA), and cerebral infarction without residual deficits: Secondary | ICD-10-CM | POA: Diagnosis not present

## 2019-11-08 DIAGNOSIS — N329 Bladder disorder, unspecified: Secondary | ICD-10-CM | POA: Diagnosis not present

## 2019-11-08 DIAGNOSIS — J45909 Unspecified asthma, uncomplicated: Secondary | ICD-10-CM | POA: Diagnosis not present

## 2019-11-08 DIAGNOSIS — J449 Chronic obstructive pulmonary disease, unspecified: Secondary | ICD-10-CM | POA: Diagnosis not present

## 2019-11-08 DIAGNOSIS — H903 Sensorineural hearing loss, bilateral: Secondary | ICD-10-CM | POA: Diagnosis not present

## 2019-11-08 DIAGNOSIS — E785 Hyperlipidemia, unspecified: Secondary | ICD-10-CM | POA: Diagnosis not present

## 2019-11-08 DIAGNOSIS — N3020 Other chronic cystitis without hematuria: Secondary | ICD-10-CM | POA: Diagnosis not present

## 2019-11-15 DIAGNOSIS — N390 Urinary tract infection, site not specified: Secondary | ICD-10-CM | POA: Diagnosis not present

## 2019-11-15 DIAGNOSIS — N952 Postmenopausal atrophic vaginitis: Secondary | ICD-10-CM | POA: Diagnosis not present

## 2019-11-15 DIAGNOSIS — N3289 Other specified disorders of bladder: Secondary | ICD-10-CM | POA: Diagnosis not present

## 2019-12-04 DIAGNOSIS — H00013 Hordeolum externum right eye, unspecified eyelid: Secondary | ICD-10-CM | POA: Diagnosis not present

## 2019-12-11 DIAGNOSIS — I10 Essential (primary) hypertension: Secondary | ICD-10-CM | POA: Diagnosis not present

## 2019-12-11 DIAGNOSIS — G8929 Other chronic pain: Secondary | ICD-10-CM | POA: Diagnosis not present

## 2019-12-11 DIAGNOSIS — M545 Low back pain, unspecified: Secondary | ICD-10-CM | POA: Diagnosis not present

## 2019-12-11 DIAGNOSIS — Z6832 Body mass index (BMI) 32.0-32.9, adult: Secondary | ICD-10-CM | POA: Diagnosis not present

## 2019-12-18 DIAGNOSIS — R296 Repeated falls: Secondary | ICD-10-CM | POA: Insufficient documentation

## 2019-12-20 DIAGNOSIS — G3184 Mild cognitive impairment, so stated: Secondary | ICD-10-CM | POA: Diagnosis not present

## 2019-12-20 DIAGNOSIS — J449 Chronic obstructive pulmonary disease, unspecified: Secondary | ICD-10-CM | POA: Diagnosis not present

## 2019-12-20 DIAGNOSIS — R0789 Other chest pain: Secondary | ICD-10-CM | POA: Diagnosis not present

## 2019-12-20 DIAGNOSIS — Z7982 Long term (current) use of aspirin: Secondary | ICD-10-CM | POA: Diagnosis not present

## 2019-12-20 DIAGNOSIS — E441 Mild protein-calorie malnutrition: Secondary | ICD-10-CM | POA: Diagnosis not present

## 2019-12-20 DIAGNOSIS — I341 Nonrheumatic mitral (valve) prolapse: Secondary | ICD-10-CM | POA: Diagnosis not present

## 2019-12-20 DIAGNOSIS — K4030 Unilateral inguinal hernia, with obstruction, without gangrene, not specified as recurrent: Secondary | ICD-10-CM | POA: Diagnosis not present

## 2019-12-20 DIAGNOSIS — F419 Anxiety disorder, unspecified: Secondary | ICD-10-CM | POA: Diagnosis not present

## 2019-12-20 DIAGNOSIS — M4712 Other spondylosis with myelopathy, cervical region: Secondary | ICD-10-CM | POA: Diagnosis not present

## 2019-12-20 DIAGNOSIS — J4540 Moderate persistent asthma, uncomplicated: Secondary | ICD-10-CM | POA: Diagnosis not present

## 2019-12-20 DIAGNOSIS — K439 Ventral hernia without obstruction or gangrene: Secondary | ICD-10-CM | POA: Diagnosis not present

## 2019-12-20 DIAGNOSIS — K219 Gastro-esophageal reflux disease without esophagitis: Secondary | ICD-10-CM | POA: Diagnosis not present

## 2019-12-20 DIAGNOSIS — R1312 Dysphagia, oropharyngeal phase: Secondary | ICD-10-CM | POA: Diagnosis not present

## 2019-12-20 DIAGNOSIS — Z01818 Encounter for other preprocedural examination: Secondary | ICD-10-CM | POA: Diagnosis not present

## 2019-12-20 DIAGNOSIS — R296 Repeated falls: Secondary | ICD-10-CM | POA: Diagnosis not present

## 2019-12-20 DIAGNOSIS — N3281 Overactive bladder: Secondary | ICD-10-CM | POA: Diagnosis not present

## 2019-12-20 DIAGNOSIS — G8929 Other chronic pain: Secondary | ICD-10-CM | POA: Diagnosis not present

## 2019-12-20 DIAGNOSIS — Z8781 Personal history of (healed) traumatic fracture: Secondary | ICD-10-CM | POA: Diagnosis not present

## 2019-12-20 DIAGNOSIS — M546 Pain in thoracic spine: Secondary | ICD-10-CM | POA: Diagnosis not present

## 2019-12-20 DIAGNOSIS — Z9189 Other specified personal risk factors, not elsewhere classified: Secondary | ICD-10-CM | POA: Diagnosis not present

## 2019-12-20 DIAGNOSIS — Z7189 Other specified counseling: Secondary | ICD-10-CM | POA: Diagnosis not present

## 2019-12-20 DIAGNOSIS — Z79899 Other long term (current) drug therapy: Secondary | ICD-10-CM | POA: Diagnosis not present

## 2019-12-20 DIAGNOSIS — M545 Low back pain, unspecified: Secondary | ICD-10-CM | POA: Diagnosis not present

## 2019-12-20 DIAGNOSIS — T8859XS Other complications of anesthesia, sequela: Secondary | ICD-10-CM | POA: Diagnosis not present

## 2019-12-20 DIAGNOSIS — M960 Pseudarthrosis after fusion or arthrodesis: Secondary | ICD-10-CM | POA: Diagnosis not present

## 2019-12-20 DIAGNOSIS — M439 Deforming dorsopathy, unspecified: Secondary | ICD-10-CM | POA: Diagnosis not present

## 2019-12-20 DIAGNOSIS — I10 Essential (primary) hypertension: Secondary | ICD-10-CM | POA: Diagnosis not present

## 2019-12-24 DIAGNOSIS — K5900 Constipation, unspecified: Secondary | ICD-10-CM | POA: Diagnosis not present

## 2019-12-24 DIAGNOSIS — R278 Other lack of coordination: Secondary | ICD-10-CM | POA: Diagnosis not present

## 2019-12-24 DIAGNOSIS — M8588 Other specified disorders of bone density and structure, other site: Secondary | ICD-10-CM | POA: Diagnosis not present

## 2019-12-24 DIAGNOSIS — Z741 Need for assistance with personal care: Secondary | ICD-10-CM | POA: Diagnosis not present

## 2019-12-24 DIAGNOSIS — G4089 Other seizures: Secondary | ICD-10-CM | POA: Diagnosis not present

## 2019-12-24 DIAGNOSIS — G629 Polyneuropathy, unspecified: Secondary | ICD-10-CM | POA: Diagnosis present

## 2019-12-24 DIAGNOSIS — Z8719 Personal history of other diseases of the digestive system: Secondary | ICD-10-CM | POA: Diagnosis not present

## 2019-12-24 DIAGNOSIS — F419 Anxiety disorder, unspecified: Secondary | ICD-10-CM | POA: Diagnosis not present

## 2019-12-24 DIAGNOSIS — Z9889 Other specified postprocedural states: Secondary | ICD-10-CM | POA: Diagnosis not present

## 2019-12-24 DIAGNOSIS — Z7982 Long term (current) use of aspirin: Secondary | ICD-10-CM | POA: Diagnosis not present

## 2019-12-24 DIAGNOSIS — R52 Pain, unspecified: Secondary | ICD-10-CM | POA: Diagnosis not present

## 2019-12-24 DIAGNOSIS — M47814 Spondylosis without myelopathy or radiculopathy, thoracic region: Secondary | ICD-10-CM | POA: Diagnosis not present

## 2019-12-24 DIAGNOSIS — G4700 Insomnia, unspecified: Secondary | ICD-10-CM | POA: Diagnosis not present

## 2019-12-24 DIAGNOSIS — Z79899 Other long term (current) drug therapy: Secondary | ICD-10-CM | POA: Diagnosis not present

## 2019-12-24 DIAGNOSIS — G8918 Other acute postprocedural pain: Secondary | ICD-10-CM | POA: Diagnosis not present

## 2019-12-24 DIAGNOSIS — Z20822 Contact with and (suspected) exposure to covid-19: Secondary | ICD-10-CM | POA: Diagnosis present

## 2019-12-24 DIAGNOSIS — F330 Major depressive disorder, recurrent, mild: Secondary | ICD-10-CM | POA: Diagnosis not present

## 2019-12-24 DIAGNOSIS — I341 Nonrheumatic mitral (valve) prolapse: Secondary | ICD-10-CM | POA: Diagnosis present

## 2019-12-24 DIAGNOSIS — E785 Hyperlipidemia, unspecified: Secondary | ICD-10-CM | POA: Diagnosis not present

## 2019-12-24 DIAGNOSIS — Z87440 Personal history of urinary (tract) infections: Secondary | ICD-10-CM | POA: Diagnosis not present

## 2019-12-24 DIAGNOSIS — M4802 Spinal stenosis, cervical region: Secondary | ICD-10-CM | POA: Diagnosis present

## 2019-12-24 DIAGNOSIS — Z8673 Personal history of transient ischemic attack (TIA), and cerebral infarction without residual deficits: Secondary | ICD-10-CM | POA: Diagnosis not present

## 2019-12-24 DIAGNOSIS — G3184 Mild cognitive impairment, so stated: Secondary | ICD-10-CM | POA: Diagnosis not present

## 2019-12-24 DIAGNOSIS — G8929 Other chronic pain: Secondary | ICD-10-CM | POA: Diagnosis present

## 2019-12-24 DIAGNOSIS — J449 Chronic obstructive pulmonary disease, unspecified: Secondary | ICD-10-CM | POA: Diagnosis present

## 2019-12-24 DIAGNOSIS — Z8249 Family history of ischemic heart disease and other diseases of the circulatory system: Secondary | ICD-10-CM | POA: Diagnosis not present

## 2019-12-24 DIAGNOSIS — Z713 Dietary counseling and surveillance: Secondary | ICD-10-CM | POA: Diagnosis not present

## 2019-12-24 DIAGNOSIS — Z981 Arthrodesis status: Secondary | ICD-10-CM | POA: Diagnosis not present

## 2019-12-24 DIAGNOSIS — R4189 Other symptoms and signs involving cognitive functions and awareness: Secondary | ICD-10-CM | POA: Diagnosis not present

## 2019-12-24 DIAGNOSIS — M47812 Spondylosis without myelopathy or radiculopathy, cervical region: Secondary | ICD-10-CM | POA: Diagnosis not present

## 2019-12-24 DIAGNOSIS — M818 Other osteoporosis without current pathological fracture: Secondary | ICD-10-CM | POA: Diagnosis not present

## 2019-12-24 DIAGNOSIS — M4324 Fusion of spine, thoracic region: Secondary | ICD-10-CM | POA: Diagnosis not present

## 2019-12-24 DIAGNOSIS — M6250 Muscle wasting and atrophy, not elsewhere classified, unspecified site: Secondary | ICD-10-CM | POA: Diagnosis not present

## 2019-12-24 DIAGNOSIS — M47816 Spondylosis without myelopathy or radiculopathy, lumbar region: Secondary | ICD-10-CM | POA: Diagnosis not present

## 2019-12-24 DIAGNOSIS — Z90710 Acquired absence of both cervix and uterus: Secondary | ICD-10-CM | POA: Diagnosis not present

## 2019-12-24 DIAGNOSIS — E46 Unspecified protein-calorie malnutrition: Secondary | ICD-10-CM | POA: Diagnosis not present

## 2019-12-24 DIAGNOSIS — M4712 Other spondylosis with myelopathy, cervical region: Secondary | ICD-10-CM | POA: Diagnosis present

## 2019-12-24 DIAGNOSIS — M47813 Spondylosis without myelopathy or radiculopathy, cervicothoracic region: Secondary | ICD-10-CM | POA: Diagnosis not present

## 2019-12-24 DIAGNOSIS — F411 Generalized anxiety disorder: Secondary | ICD-10-CM | POA: Diagnosis not present

## 2019-12-24 DIAGNOSIS — Z800 Family history of malignant neoplasm of digestive organs: Secondary | ICD-10-CM | POA: Diagnosis not present

## 2019-12-24 DIAGNOSIS — D649 Anemia, unspecified: Secondary | ICD-10-CM | POA: Diagnosis not present

## 2019-12-24 DIAGNOSIS — Z9189 Other specified personal risk factors, not elsewhere classified: Secondary | ICD-10-CM | POA: Diagnosis not present

## 2019-12-24 DIAGNOSIS — Z7189 Other specified counseling: Secondary | ICD-10-CM | POA: Diagnosis not present

## 2019-12-24 DIAGNOSIS — J45998 Other asthma: Secondary | ICD-10-CM | POA: Diagnosis not present

## 2019-12-24 DIAGNOSIS — M4322 Fusion of spine, cervical region: Secondary | ICD-10-CM | POA: Diagnosis not present

## 2019-12-24 DIAGNOSIS — I10 Essential (primary) hypertension: Secondary | ICD-10-CM | POA: Diagnosis present

## 2019-12-24 DIAGNOSIS — E876 Hypokalemia: Secondary | ICD-10-CM | POA: Diagnosis not present

## 2019-12-24 DIAGNOSIS — R296 Repeated falls: Secondary | ICD-10-CM | POA: Diagnosis not present

## 2019-12-24 DIAGNOSIS — F32A Depression, unspecified: Secondary | ICD-10-CM | POA: Diagnosis present

## 2019-12-24 DIAGNOSIS — K219 Gastro-esophageal reflux disease without esophagitis: Secondary | ICD-10-CM | POA: Diagnosis not present

## 2019-12-24 DIAGNOSIS — T84296A Other mechanical complication of internal fixation device of vertebrae, initial encounter: Secondary | ICD-10-CM | POA: Diagnosis not present

## 2019-12-24 DIAGNOSIS — G40909 Epilepsy, unspecified, not intractable, without status epilepticus: Secondary | ICD-10-CM | POA: Diagnosis present

## 2019-12-24 DIAGNOSIS — Z9049 Acquired absence of other specified parts of digestive tract: Secondary | ICD-10-CM | POA: Diagnosis not present

## 2019-12-24 DIAGNOSIS — M960 Pseudarthrosis after fusion or arthrodesis: Secondary | ICD-10-CM | POA: Diagnosis present

## 2019-12-24 DIAGNOSIS — M6281 Muscle weakness (generalized): Secondary | ICD-10-CM | POA: Diagnosis not present

## 2019-12-28 DIAGNOSIS — M960 Pseudarthrosis after fusion or arthrodesis: Secondary | ICD-10-CM | POA: Diagnosis not present

## 2019-12-28 DIAGNOSIS — M4322 Fusion of spine, cervical region: Secondary | ICD-10-CM | POA: Diagnosis not present

## 2019-12-28 DIAGNOSIS — M6250 Muscle wasting and atrophy, not elsewhere classified, unspecified site: Secondary | ICD-10-CM | POA: Diagnosis not present

## 2019-12-28 DIAGNOSIS — M4324 Fusion of spine, thoracic region: Secondary | ICD-10-CM | POA: Insufficient documentation

## 2019-12-28 DIAGNOSIS — F330 Major depressive disorder, recurrent, mild: Secondary | ICD-10-CM | POA: Diagnosis not present

## 2019-12-28 DIAGNOSIS — R296 Repeated falls: Secondary | ICD-10-CM | POA: Diagnosis not present

## 2019-12-28 DIAGNOSIS — Z741 Need for assistance with personal care: Secondary | ICD-10-CM | POA: Diagnosis not present

## 2019-12-28 DIAGNOSIS — M47813 Spondylosis without myelopathy or radiculopathy, cervicothoracic region: Secondary | ICD-10-CM | POA: Insufficient documentation

## 2019-12-28 DIAGNOSIS — K219 Gastro-esophageal reflux disease without esophagitis: Secondary | ICD-10-CM | POA: Diagnosis not present

## 2019-12-28 DIAGNOSIS — R278 Other lack of coordination: Secondary | ICD-10-CM | POA: Diagnosis not present

## 2019-12-28 DIAGNOSIS — E46 Unspecified protein-calorie malnutrition: Secondary | ICD-10-CM

## 2019-12-28 DIAGNOSIS — I10 Essential (primary) hypertension: Secondary | ICD-10-CM | POA: Diagnosis not present

## 2019-12-28 DIAGNOSIS — F419 Anxiety disorder, unspecified: Secondary | ICD-10-CM | POA: Diagnosis not present

## 2019-12-28 DIAGNOSIS — Z8781 Personal history of (healed) traumatic fracture: Secondary | ICD-10-CM | POA: Diagnosis not present

## 2019-12-28 DIAGNOSIS — G8929 Other chronic pain: Secondary | ICD-10-CM | POA: Diagnosis not present

## 2019-12-28 DIAGNOSIS — G4700 Insomnia, unspecified: Secondary | ICD-10-CM | POA: Insufficient documentation

## 2019-12-28 DIAGNOSIS — M4800 Spinal stenosis, site unspecified: Secondary | ICD-10-CM | POA: Diagnosis not present

## 2019-12-28 DIAGNOSIS — E785 Hyperlipidemia, unspecified: Secondary | ICD-10-CM | POA: Diagnosis not present

## 2019-12-28 DIAGNOSIS — G3184 Mild cognitive impairment, so stated: Secondary | ICD-10-CM | POA: Diagnosis not present

## 2019-12-28 DIAGNOSIS — D649 Anemia, unspecified: Secondary | ICD-10-CM | POA: Diagnosis not present

## 2019-12-28 DIAGNOSIS — J45909 Unspecified asthma, uncomplicated: Secondary | ICD-10-CM | POA: Diagnosis not present

## 2019-12-28 DIAGNOSIS — I341 Nonrheumatic mitral (valve) prolapse: Secondary | ICD-10-CM | POA: Diagnosis not present

## 2019-12-28 DIAGNOSIS — M6281 Muscle weakness (generalized): Secondary | ICD-10-CM | POA: Diagnosis not present

## 2019-12-28 DIAGNOSIS — R52 Pain, unspecified: Secondary | ICD-10-CM | POA: Diagnosis not present

## 2019-12-28 DIAGNOSIS — M549 Dorsalgia, unspecified: Secondary | ICD-10-CM | POA: Diagnosis not present

## 2019-12-28 DIAGNOSIS — M170 Bilateral primary osteoarthritis of knee: Secondary | ICD-10-CM | POA: Diagnosis not present

## 2019-12-28 DIAGNOSIS — M818 Other osteoporosis without current pathological fracture: Secondary | ICD-10-CM | POA: Diagnosis not present

## 2019-12-28 DIAGNOSIS — F411 Generalized anxiety disorder: Secondary | ICD-10-CM | POA: Insufficient documentation

## 2019-12-28 DIAGNOSIS — Z9889 Other specified postprocedural states: Secondary | ICD-10-CM | POA: Diagnosis not present

## 2019-12-28 DIAGNOSIS — Z9181 History of falling: Secondary | ICD-10-CM | POA: Diagnosis not present

## 2019-12-28 DIAGNOSIS — E876 Hypokalemia: Secondary | ICD-10-CM | POA: Diagnosis not present

## 2019-12-28 DIAGNOSIS — M810 Age-related osteoporosis without current pathological fracture: Secondary | ICD-10-CM | POA: Diagnosis not present

## 2019-12-28 DIAGNOSIS — G4089 Other seizures: Secondary | ICD-10-CM | POA: Diagnosis not present

## 2019-12-28 DIAGNOSIS — K5900 Constipation, unspecified: Secondary | ICD-10-CM | POA: Diagnosis not present

## 2019-12-28 DIAGNOSIS — G8918 Other acute postprocedural pain: Secondary | ICD-10-CM | POA: Diagnosis not present

## 2019-12-28 DIAGNOSIS — M439 Deforming dorsopathy, unspecified: Secondary | ICD-10-CM | POA: Diagnosis not present

## 2019-12-28 DIAGNOSIS — J45998 Other asthma: Secondary | ICD-10-CM | POA: Diagnosis not present

## 2019-12-28 DIAGNOSIS — T84296D Other mechanical complication of internal fixation device of vertebrae, subsequent encounter: Secondary | ICD-10-CM | POA: Diagnosis not present

## 2019-12-28 DIAGNOSIS — N3281 Overactive bladder: Secondary | ICD-10-CM | POA: Diagnosis not present

## 2019-12-28 HISTORY — DX: Spondylosis without myelopathy or radiculopathy, cervicothoracic region: M47.813

## 2019-12-28 HISTORY — DX: Unspecified protein-calorie malnutrition: E46

## 2019-12-28 HISTORY — DX: Fusion of spine, cervical region: M43.22

## 2019-12-28 HISTORY — DX: Insomnia, unspecified: G47.00

## 2019-12-28 HISTORY — DX: Fusion of spine, thoracic region: M43.24

## 2019-12-28 HISTORY — DX: Generalized anxiety disorder: F41.1

## 2019-12-29 DIAGNOSIS — M6281 Muscle weakness (generalized): Secondary | ICD-10-CM | POA: Insufficient documentation

## 2019-12-29 HISTORY — DX: Muscle weakness (generalized): M62.81

## 2019-12-31 DIAGNOSIS — G4089 Other seizures: Secondary | ICD-10-CM | POA: Diagnosis not present

## 2019-12-31 DIAGNOSIS — D649 Anemia, unspecified: Secondary | ICD-10-CM | POA: Diagnosis not present

## 2019-12-31 DIAGNOSIS — J45998 Other asthma: Secondary | ICD-10-CM | POA: Diagnosis not present

## 2019-12-31 DIAGNOSIS — M818 Other osteoporosis without current pathological fracture: Secondary | ICD-10-CM | POA: Diagnosis not present

## 2019-12-31 DIAGNOSIS — K5900 Constipation, unspecified: Secondary | ICD-10-CM | POA: Insufficient documentation

## 2019-12-31 DIAGNOSIS — F419 Anxiety disorder, unspecified: Secondary | ICD-10-CM | POA: Diagnosis not present

## 2019-12-31 DIAGNOSIS — M4324 Fusion of spine, thoracic region: Secondary | ICD-10-CM | POA: Diagnosis not present

## 2019-12-31 DIAGNOSIS — F330 Major depressive disorder, recurrent, mild: Secondary | ICD-10-CM | POA: Diagnosis not present

## 2019-12-31 DIAGNOSIS — M6281 Muscle weakness (generalized): Secondary | ICD-10-CM | POA: Diagnosis not present

## 2019-12-31 DIAGNOSIS — E876 Hypokalemia: Secondary | ICD-10-CM | POA: Insufficient documentation

## 2019-12-31 DIAGNOSIS — E785 Hyperlipidemia, unspecified: Secondary | ICD-10-CM | POA: Diagnosis not present

## 2019-12-31 DIAGNOSIS — K219 Gastro-esophageal reflux disease without esophagitis: Secondary | ICD-10-CM | POA: Diagnosis not present

## 2019-12-31 DIAGNOSIS — F329 Major depressive disorder, single episode, unspecified: Secondary | ICD-10-CM

## 2019-12-31 DIAGNOSIS — I10 Essential (primary) hypertension: Secondary | ICD-10-CM | POA: Diagnosis not present

## 2019-12-31 HISTORY — DX: Major depressive disorder, single episode, unspecified: F32.9

## 2019-12-31 HISTORY — DX: Anemia, unspecified: D64.9

## 2019-12-31 HISTORY — DX: Hyperlipidemia, unspecified: E78.5

## 2019-12-31 HISTORY — DX: Constipation, unspecified: K59.00

## 2019-12-31 HISTORY — DX: Hypokalemia: E87.6

## 2020-01-07 DIAGNOSIS — M6281 Muscle weakness (generalized): Secondary | ICD-10-CM | POA: Diagnosis not present

## 2020-01-07 DIAGNOSIS — G4089 Other seizures: Secondary | ICD-10-CM | POA: Diagnosis not present

## 2020-01-07 DIAGNOSIS — K5900 Constipation, unspecified: Secondary | ICD-10-CM | POA: Diagnosis not present

## 2020-01-07 DIAGNOSIS — I10 Essential (primary) hypertension: Secondary | ICD-10-CM | POA: Diagnosis not present

## 2020-01-07 DIAGNOSIS — F330 Major depressive disorder, recurrent, mild: Secondary | ICD-10-CM | POA: Diagnosis not present

## 2020-01-07 DIAGNOSIS — E785 Hyperlipidemia, unspecified: Secondary | ICD-10-CM | POA: Diagnosis not present

## 2020-01-07 DIAGNOSIS — M818 Other osteoporosis without current pathological fracture: Secondary | ICD-10-CM | POA: Diagnosis not present

## 2020-01-07 DIAGNOSIS — M4324 Fusion of spine, thoracic region: Secondary | ICD-10-CM | POA: Diagnosis not present

## 2020-01-07 DIAGNOSIS — I341 Nonrheumatic mitral (valve) prolapse: Secondary | ICD-10-CM | POA: Diagnosis not present

## 2020-01-07 DIAGNOSIS — M47813 Spondylosis without myelopathy or radiculopathy, cervicothoracic region: Secondary | ICD-10-CM | POA: Diagnosis not present

## 2020-01-07 DIAGNOSIS — D649 Anemia, unspecified: Secondary | ICD-10-CM | POA: Diagnosis not present

## 2020-01-07 DIAGNOSIS — M960 Pseudarthrosis after fusion or arthrodesis: Secondary | ICD-10-CM | POA: Diagnosis not present

## 2020-01-07 DIAGNOSIS — M170 Bilateral primary osteoarthritis of knee: Secondary | ICD-10-CM | POA: Diagnosis not present

## 2020-01-07 DIAGNOSIS — T84296D Other mechanical complication of internal fixation device of vertebrae, subsequent encounter: Secondary | ICD-10-CM | POA: Diagnosis not present

## 2020-01-07 DIAGNOSIS — J45998 Other asthma: Secondary | ICD-10-CM | POA: Diagnosis not present

## 2020-01-07 DIAGNOSIS — F419 Anxiety disorder, unspecified: Secondary | ICD-10-CM | POA: Diagnosis not present

## 2020-01-07 DIAGNOSIS — K219 Gastro-esophageal reflux disease without esophagitis: Secondary | ICD-10-CM | POA: Diagnosis not present

## 2020-01-09 DIAGNOSIS — Z4802 Encounter for removal of sutures: Secondary | ICD-10-CM | POA: Diagnosis not present

## 2020-01-10 DIAGNOSIS — T84296D Other mechanical complication of internal fixation device of vertebrae, subsequent encounter: Secondary | ICD-10-CM | POA: Diagnosis not present

## 2020-01-10 DIAGNOSIS — I10 Essential (primary) hypertension: Secondary | ICD-10-CM | POA: Diagnosis not present

## 2020-01-10 DIAGNOSIS — I341 Nonrheumatic mitral (valve) prolapse: Secondary | ICD-10-CM | POA: Diagnosis not present

## 2020-01-10 DIAGNOSIS — M170 Bilateral primary osteoarthritis of knee: Secondary | ICD-10-CM | POA: Diagnosis not present

## 2020-01-10 DIAGNOSIS — M47813 Spondylosis without myelopathy or radiculopathy, cervicothoracic region: Secondary | ICD-10-CM | POA: Diagnosis not present

## 2020-01-10 DIAGNOSIS — M960 Pseudarthrosis after fusion or arthrodesis: Secondary | ICD-10-CM | POA: Diagnosis not present

## 2020-01-14 DIAGNOSIS — T84296D Other mechanical complication of internal fixation device of vertebrae, subsequent encounter: Secondary | ICD-10-CM | POA: Diagnosis not present

## 2020-01-14 DIAGNOSIS — M47813 Spondylosis without myelopathy or radiculopathy, cervicothoracic region: Secondary | ICD-10-CM | POA: Diagnosis not present

## 2020-01-14 DIAGNOSIS — I341 Nonrheumatic mitral (valve) prolapse: Secondary | ICD-10-CM | POA: Diagnosis not present

## 2020-01-14 DIAGNOSIS — M960 Pseudarthrosis after fusion or arthrodesis: Secondary | ICD-10-CM | POA: Diagnosis not present

## 2020-01-14 DIAGNOSIS — M170 Bilateral primary osteoarthritis of knee: Secondary | ICD-10-CM | POA: Diagnosis not present

## 2020-01-14 DIAGNOSIS — I10 Essential (primary) hypertension: Secondary | ICD-10-CM | POA: Diagnosis not present

## 2020-01-16 DIAGNOSIS — M960 Pseudarthrosis after fusion or arthrodesis: Secondary | ICD-10-CM | POA: Diagnosis not present

## 2020-01-16 DIAGNOSIS — T84296D Other mechanical complication of internal fixation device of vertebrae, subsequent encounter: Secondary | ICD-10-CM | POA: Diagnosis not present

## 2020-01-16 DIAGNOSIS — M47813 Spondylosis without myelopathy or radiculopathy, cervicothoracic region: Secondary | ICD-10-CM | POA: Diagnosis not present

## 2020-01-16 DIAGNOSIS — I10 Essential (primary) hypertension: Secondary | ICD-10-CM | POA: Diagnosis not present

## 2020-01-16 DIAGNOSIS — I341 Nonrheumatic mitral (valve) prolapse: Secondary | ICD-10-CM | POA: Diagnosis not present

## 2020-01-16 DIAGNOSIS — M170 Bilateral primary osteoarthritis of knee: Secondary | ICD-10-CM | POA: Diagnosis not present

## 2020-01-19 DIAGNOSIS — B961 Klebsiella pneumoniae [K. pneumoniae] as the cause of diseases classified elsewhere: Secondary | ICD-10-CM | POA: Diagnosis present

## 2020-01-19 DIAGNOSIS — M47813 Spondylosis without myelopathy or radiculopathy, cervicothoracic region: Secondary | ICD-10-CM | POA: Diagnosis not present

## 2020-01-19 DIAGNOSIS — E873 Alkalosis: Secondary | ICD-10-CM | POA: Diagnosis not present

## 2020-01-19 DIAGNOSIS — M170 Bilateral primary osteoarthritis of knee: Secondary | ICD-10-CM | POA: Diagnosis not present

## 2020-01-19 DIAGNOSIS — T84296D Other mechanical complication of internal fixation device of vertebrae, subsequent encounter: Secondary | ICD-10-CM | POA: Diagnosis not present

## 2020-01-19 DIAGNOSIS — Z981 Arthrodesis status: Secondary | ICD-10-CM | POA: Diagnosis not present

## 2020-01-19 DIAGNOSIS — T8131XA Disruption of external operation (surgical) wound, not elsewhere classified, initial encounter: Secondary | ICD-10-CM | POA: Diagnosis present

## 2020-01-19 DIAGNOSIS — E785 Hyperlipidemia, unspecified: Secondary | ICD-10-CM | POA: Diagnosis present

## 2020-01-19 DIAGNOSIS — T8141XA Infection following a procedure, superficial incisional surgical site, initial encounter: Secondary | ICD-10-CM | POA: Diagnosis present

## 2020-01-19 DIAGNOSIS — I341 Nonrheumatic mitral (valve) prolapse: Secondary | ICD-10-CM | POA: Diagnosis not present

## 2020-01-19 DIAGNOSIS — M960 Pseudarthrosis after fusion or arthrodesis: Secondary | ICD-10-CM | POA: Diagnosis not present

## 2020-01-19 DIAGNOSIS — Z91013 Allergy to seafood: Secondary | ICD-10-CM | POA: Diagnosis not present

## 2020-01-19 DIAGNOSIS — R748 Abnormal levels of other serum enzymes: Secondary | ICD-10-CM | POA: Diagnosis not present

## 2020-01-19 DIAGNOSIS — H9193 Unspecified hearing loss, bilateral: Secondary | ICD-10-CM | POA: Diagnosis present

## 2020-01-19 DIAGNOSIS — M542 Cervicalgia: Secondary | ICD-10-CM | POA: Diagnosis not present

## 2020-01-19 DIAGNOSIS — T8142XA Infection following a procedure, deep incisional surgical site, initial encounter: Secondary | ICD-10-CM | POA: Diagnosis not present

## 2020-01-19 DIAGNOSIS — G8918 Other acute postprocedural pain: Secondary | ICD-10-CM | POA: Diagnosis not present

## 2020-01-19 DIAGNOSIS — Z7982 Long term (current) use of aspirin: Secondary | ICD-10-CM | POA: Diagnosis not present

## 2020-01-19 DIAGNOSIS — R69 Illness, unspecified: Secondary | ICD-10-CM | POA: Diagnosis not present

## 2020-01-19 DIAGNOSIS — N3281 Overactive bladder: Secondary | ICD-10-CM | POA: Diagnosis present

## 2020-01-19 DIAGNOSIS — M818 Other osteoporosis without current pathological fracture: Secondary | ICD-10-CM | POA: Diagnosis present

## 2020-01-19 DIAGNOSIS — Z9889 Other specified postprocedural states: Secondary | ICD-10-CM | POA: Diagnosis not present

## 2020-01-19 DIAGNOSIS — Z90710 Acquired absence of both cervix and uterus: Secondary | ICD-10-CM | POA: Diagnosis not present

## 2020-01-19 DIAGNOSIS — R000 Tachycardia, unspecified: Secondary | ICD-10-CM | POA: Diagnosis not present

## 2020-01-19 DIAGNOSIS — I10 Essential (primary) hypertension: Secondary | ICD-10-CM | POA: Diagnosis not present

## 2020-01-19 DIAGNOSIS — J449 Chronic obstructive pulmonary disease, unspecified: Secondary | ICD-10-CM | POA: Diagnosis present

## 2020-01-19 DIAGNOSIS — D72828 Other elevated white blood cell count: Secondary | ICD-10-CM | POA: Diagnosis not present

## 2020-01-19 DIAGNOSIS — Z20822 Contact with and (suspected) exposure to covid-19: Secondary | ICD-10-CM | POA: Diagnosis present

## 2020-01-19 DIAGNOSIS — R700 Elevated erythrocyte sedimentation rate: Secondary | ICD-10-CM | POA: Diagnosis not present

## 2020-01-19 DIAGNOSIS — Z8673 Personal history of transient ischemic attack (TIA), and cerebral infarction without residual deficits: Secondary | ICD-10-CM | POA: Diagnosis not present

## 2020-01-25 DIAGNOSIS — I341 Nonrheumatic mitral (valve) prolapse: Secondary | ICD-10-CM | POA: Diagnosis not present

## 2020-01-25 DIAGNOSIS — M47813 Spondylosis without myelopathy or radiculopathy, cervicothoracic region: Secondary | ICD-10-CM | POA: Diagnosis not present

## 2020-01-25 DIAGNOSIS — M960 Pseudarthrosis after fusion or arthrodesis: Secondary | ICD-10-CM | POA: Diagnosis not present

## 2020-01-25 DIAGNOSIS — T84296D Other mechanical complication of internal fixation device of vertebrae, subsequent encounter: Secondary | ICD-10-CM | POA: Diagnosis not present

## 2020-01-25 DIAGNOSIS — M170 Bilateral primary osteoarthritis of knee: Secondary | ICD-10-CM | POA: Diagnosis not present

## 2020-01-25 DIAGNOSIS — I10 Essential (primary) hypertension: Secondary | ICD-10-CM | POA: Diagnosis not present

## 2020-01-27 DIAGNOSIS — T84296D Other mechanical complication of internal fixation device of vertebrae, subsequent encounter: Secondary | ICD-10-CM | POA: Diagnosis not present

## 2020-01-27 DIAGNOSIS — I341 Nonrheumatic mitral (valve) prolapse: Secondary | ICD-10-CM | POA: Diagnosis not present

## 2020-01-27 DIAGNOSIS — M960 Pseudarthrosis after fusion or arthrodesis: Secondary | ICD-10-CM | POA: Diagnosis not present

## 2020-01-27 DIAGNOSIS — M47813 Spondylosis without myelopathy or radiculopathy, cervicothoracic region: Secondary | ICD-10-CM | POA: Diagnosis not present

## 2020-01-27 DIAGNOSIS — I10 Essential (primary) hypertension: Secondary | ICD-10-CM | POA: Diagnosis not present

## 2020-01-27 DIAGNOSIS — M170 Bilateral primary osteoarthritis of knee: Secondary | ICD-10-CM | POA: Diagnosis not present

## 2020-01-28 DIAGNOSIS — M960 Pseudarthrosis after fusion or arthrodesis: Secondary | ICD-10-CM | POA: Diagnosis not present

## 2020-01-28 DIAGNOSIS — T84296D Other mechanical complication of internal fixation device of vertebrae, subsequent encounter: Secondary | ICD-10-CM | POA: Diagnosis not present

## 2020-01-28 DIAGNOSIS — I10 Essential (primary) hypertension: Secondary | ICD-10-CM | POA: Diagnosis not present

## 2020-01-28 DIAGNOSIS — M47813 Spondylosis without myelopathy or radiculopathy, cervicothoracic region: Secondary | ICD-10-CM | POA: Diagnosis not present

## 2020-01-28 DIAGNOSIS — M170 Bilateral primary osteoarthritis of knee: Secondary | ICD-10-CM | POA: Diagnosis not present

## 2020-01-28 DIAGNOSIS — A499 Bacterial infection, unspecified: Secondary | ICD-10-CM | POA: Diagnosis not present

## 2020-01-28 DIAGNOSIS — I341 Nonrheumatic mitral (valve) prolapse: Secondary | ICD-10-CM | POA: Diagnosis not present

## 2020-01-28 DIAGNOSIS — T8142XA Infection following a procedure, deep incisional surgical site, initial encounter: Secondary | ICD-10-CM | POA: Diagnosis not present

## 2020-01-31 DIAGNOSIS — Z79899 Other long term (current) drug therapy: Secondary | ICD-10-CM | POA: Diagnosis not present

## 2020-01-31 DIAGNOSIS — T8142XA Infection following a procedure, deep incisional surgical site, initial encounter: Secondary | ICD-10-CM | POA: Diagnosis not present

## 2020-01-31 DIAGNOSIS — I10 Essential (primary) hypertension: Secondary | ICD-10-CM | POA: Diagnosis not present

## 2020-01-31 DIAGNOSIS — Z6831 Body mass index (BMI) 31.0-31.9, adult: Secondary | ICD-10-CM | POA: Diagnosis not present

## 2020-01-31 DIAGNOSIS — D539 Nutritional anemia, unspecified: Secondary | ICD-10-CM | POA: Diagnosis not present

## 2020-01-31 DIAGNOSIS — T84296D Other mechanical complication of internal fixation device of vertebrae, subsequent encounter: Secondary | ICD-10-CM | POA: Diagnosis not present

## 2020-01-31 DIAGNOSIS — B961 Klebsiella pneumoniae [K. pneumoniae] as the cause of diseases classified elsewhere: Secondary | ICD-10-CM | POA: Diagnosis not present

## 2020-02-04 DIAGNOSIS — I10 Essential (primary) hypertension: Secondary | ICD-10-CM | POA: Diagnosis not present

## 2020-02-04 DIAGNOSIS — T8132XD Disruption of internal operation (surgical) wound, not elsewhere classified, subsequent encounter: Secondary | ICD-10-CM | POA: Diagnosis not present

## 2020-02-04 DIAGNOSIS — M47813 Spondylosis without myelopathy or radiculopathy, cervicothoracic region: Secondary | ICD-10-CM | POA: Diagnosis not present

## 2020-02-04 DIAGNOSIS — T84296D Other mechanical complication of internal fixation device of vertebrae, subsequent encounter: Secondary | ICD-10-CM | POA: Diagnosis not present

## 2020-02-04 DIAGNOSIS — M960 Pseudarthrosis after fusion or arthrodesis: Secondary | ICD-10-CM | POA: Diagnosis not present

## 2020-02-04 DIAGNOSIS — I341 Nonrheumatic mitral (valve) prolapse: Secondary | ICD-10-CM | POA: Diagnosis not present

## 2020-02-04 DIAGNOSIS — M170 Bilateral primary osteoarthritis of knee: Secondary | ICD-10-CM | POA: Diagnosis not present

## 2020-02-06 DIAGNOSIS — M47813 Spondylosis without myelopathy or radiculopathy, cervicothoracic region: Secondary | ICD-10-CM | POA: Diagnosis not present

## 2020-02-06 DIAGNOSIS — T84296D Other mechanical complication of internal fixation device of vertebrae, subsequent encounter: Secondary | ICD-10-CM | POA: Diagnosis not present

## 2020-02-06 DIAGNOSIS — M4800 Spinal stenosis, site unspecified: Secondary | ICD-10-CM | POA: Diagnosis not present

## 2020-02-06 DIAGNOSIS — N3281 Overactive bladder: Secondary | ICD-10-CM | POA: Diagnosis not present

## 2020-02-06 DIAGNOSIS — I10 Essential (primary) hypertension: Secondary | ICD-10-CM | POA: Diagnosis not present

## 2020-02-06 DIAGNOSIS — J45909 Unspecified asthma, uncomplicated: Secondary | ICD-10-CM | POA: Diagnosis not present

## 2020-02-06 DIAGNOSIS — M170 Bilateral primary osteoarthritis of knee: Secondary | ICD-10-CM | POA: Diagnosis not present

## 2020-02-06 DIAGNOSIS — T8142XA Infection following a procedure, deep incisional surgical site, initial encounter: Secondary | ICD-10-CM | POA: Diagnosis not present

## 2020-02-06 DIAGNOSIS — Z452 Encounter for adjustment and management of vascular access device: Secondary | ICD-10-CM | POA: Diagnosis not present

## 2020-02-06 DIAGNOSIS — M549 Dorsalgia, unspecified: Secondary | ICD-10-CM | POA: Diagnosis not present

## 2020-02-06 DIAGNOSIS — M439 Deforming dorsopathy, unspecified: Secondary | ICD-10-CM | POA: Diagnosis not present

## 2020-02-06 DIAGNOSIS — K219 Gastro-esophageal reflux disease without esophagitis: Secondary | ICD-10-CM | POA: Diagnosis not present

## 2020-02-06 DIAGNOSIS — F419 Anxiety disorder, unspecified: Secondary | ICD-10-CM | POA: Diagnosis not present

## 2020-02-06 DIAGNOSIS — R296 Repeated falls: Secondary | ICD-10-CM | POA: Diagnosis not present

## 2020-02-06 DIAGNOSIS — Z9181 History of falling: Secondary | ICD-10-CM | POA: Diagnosis not present

## 2020-02-06 DIAGNOSIS — E46 Unspecified protein-calorie malnutrition: Secondary | ICD-10-CM | POA: Diagnosis not present

## 2020-02-06 DIAGNOSIS — I341 Nonrheumatic mitral (valve) prolapse: Secondary | ICD-10-CM | POA: Diagnosis not present

## 2020-02-06 DIAGNOSIS — Z5181 Encounter for therapeutic drug level monitoring: Secondary | ICD-10-CM | POA: Diagnosis not present

## 2020-02-06 DIAGNOSIS — G3184 Mild cognitive impairment, so stated: Secondary | ICD-10-CM | POA: Diagnosis not present

## 2020-02-06 DIAGNOSIS — B961 Klebsiella pneumoniae [K. pneumoniae] as the cause of diseases classified elsewhere: Secondary | ICD-10-CM | POA: Diagnosis not present

## 2020-02-06 DIAGNOSIS — T8132XD Disruption of internal operation (surgical) wound, not elsewhere classified, subsequent encounter: Secondary | ICD-10-CM | POA: Diagnosis not present

## 2020-02-06 DIAGNOSIS — M810 Age-related osteoporosis without current pathological fracture: Secondary | ICD-10-CM | POA: Diagnosis not present

## 2020-02-06 DIAGNOSIS — G4700 Insomnia, unspecified: Secondary | ICD-10-CM | POA: Diagnosis not present

## 2020-02-06 DIAGNOSIS — Z9889 Other specified postprocedural states: Secondary | ICD-10-CM | POA: Diagnosis not present

## 2020-02-06 DIAGNOSIS — G8929 Other chronic pain: Secondary | ICD-10-CM | POA: Diagnosis not present

## 2020-02-06 DIAGNOSIS — M960 Pseudarthrosis after fusion or arthrodesis: Secondary | ICD-10-CM | POA: Diagnosis not present

## 2020-02-11 DIAGNOSIS — T8132XD Disruption of internal operation (surgical) wound, not elsewhere classified, subsequent encounter: Secondary | ICD-10-CM | POA: Diagnosis not present

## 2020-02-11 DIAGNOSIS — M47813 Spondylosis without myelopathy or radiculopathy, cervicothoracic region: Secondary | ICD-10-CM | POA: Diagnosis not present

## 2020-02-11 DIAGNOSIS — T84296D Other mechanical complication of internal fixation device of vertebrae, subsequent encounter: Secondary | ICD-10-CM | POA: Diagnosis not present

## 2020-02-11 DIAGNOSIS — T8142XA Infection following a procedure, deep incisional surgical site, initial encounter: Secondary | ICD-10-CM | POA: Diagnosis not present

## 2020-02-11 DIAGNOSIS — M960 Pseudarthrosis after fusion or arthrodesis: Secondary | ICD-10-CM | POA: Diagnosis not present

## 2020-02-11 DIAGNOSIS — B961 Klebsiella pneumoniae [K. pneumoniae] as the cause of diseases classified elsewhere: Secondary | ICD-10-CM | POA: Diagnosis not present

## 2020-02-12 DIAGNOSIS — T8141XA Infection following a procedure, superficial incisional surgical site, initial encounter: Secondary | ICD-10-CM | POA: Diagnosis not present

## 2020-02-12 DIAGNOSIS — A498 Other bacterial infections of unspecified site: Secondary | ICD-10-CM | POA: Diagnosis not present

## 2020-02-12 DIAGNOSIS — B961 Klebsiella pneumoniae [K. pneumoniae] as the cause of diseases classified elsewhere: Secondary | ICD-10-CM | POA: Diagnosis not present

## 2020-02-12 DIAGNOSIS — Z4802 Encounter for removal of sutures: Secondary | ICD-10-CM | POA: Diagnosis not present

## 2020-02-12 DIAGNOSIS — T8149XA Infection following a procedure, other surgical site, initial encounter: Secondary | ICD-10-CM | POA: Diagnosis not present

## 2020-02-12 DIAGNOSIS — Z90721 Acquired absence of ovaries, unilateral: Secondary | ICD-10-CM | POA: Diagnosis not present

## 2020-02-12 DIAGNOSIS — Z452 Encounter for adjustment and management of vascular access device: Secondary | ICD-10-CM | POA: Diagnosis not present

## 2020-02-12 DIAGNOSIS — Z9049 Acquired absence of other specified parts of digestive tract: Secondary | ICD-10-CM | POA: Diagnosis not present

## 2020-02-12 DIAGNOSIS — Z792 Long term (current) use of antibiotics: Secondary | ICD-10-CM | POA: Diagnosis not present

## 2020-02-12 DIAGNOSIS — Z79899 Other long term (current) drug therapy: Secondary | ICD-10-CM | POA: Diagnosis not present

## 2020-02-18 DIAGNOSIS — T84296D Other mechanical complication of internal fixation device of vertebrae, subsequent encounter: Secondary | ICD-10-CM | POA: Diagnosis not present

## 2020-02-18 DIAGNOSIS — T8142XA Infection following a procedure, deep incisional surgical site, initial encounter: Secondary | ICD-10-CM | POA: Diagnosis not present

## 2020-02-18 DIAGNOSIS — B961 Klebsiella pneumoniae [K. pneumoniae] as the cause of diseases classified elsewhere: Secondary | ICD-10-CM | POA: Diagnosis not present

## 2020-02-18 DIAGNOSIS — T8132XD Disruption of internal operation (surgical) wound, not elsewhere classified, subsequent encounter: Secondary | ICD-10-CM | POA: Diagnosis not present

## 2020-02-18 DIAGNOSIS — M960 Pseudarthrosis after fusion or arthrodesis: Secondary | ICD-10-CM | POA: Diagnosis not present

## 2020-02-18 DIAGNOSIS — M47813 Spondylosis without myelopathy or radiculopathy, cervicothoracic region: Secondary | ICD-10-CM | POA: Diagnosis not present

## 2020-02-25 DIAGNOSIS — B961 Klebsiella pneumoniae [K. pneumoniae] as the cause of diseases classified elsewhere: Secondary | ICD-10-CM | POA: Diagnosis not present

## 2020-02-25 DIAGNOSIS — T84296D Other mechanical complication of internal fixation device of vertebrae, subsequent encounter: Secondary | ICD-10-CM | POA: Diagnosis not present

## 2020-02-25 DIAGNOSIS — M960 Pseudarthrosis after fusion or arthrodesis: Secondary | ICD-10-CM | POA: Diagnosis not present

## 2020-02-25 DIAGNOSIS — T8142XA Infection following a procedure, deep incisional surgical site, initial encounter: Secondary | ICD-10-CM | POA: Diagnosis not present

## 2020-02-25 DIAGNOSIS — M47813 Spondylosis without myelopathy or radiculopathy, cervicothoracic region: Secondary | ICD-10-CM | POA: Diagnosis not present

## 2020-02-25 DIAGNOSIS — T8132XD Disruption of internal operation (surgical) wound, not elsewhere classified, subsequent encounter: Secondary | ICD-10-CM | POA: Diagnosis not present

## 2020-02-29 DIAGNOSIS — M960 Pseudarthrosis after fusion or arthrodesis: Secondary | ICD-10-CM | POA: Diagnosis not present

## 2020-02-29 DIAGNOSIS — T84296D Other mechanical complication of internal fixation device of vertebrae, subsequent encounter: Secondary | ICD-10-CM | POA: Diagnosis not present

## 2020-02-29 DIAGNOSIS — T8132XD Disruption of internal operation (surgical) wound, not elsewhere classified, subsequent encounter: Secondary | ICD-10-CM | POA: Diagnosis not present

## 2020-02-29 DIAGNOSIS — B961 Klebsiella pneumoniae [K. pneumoniae] as the cause of diseases classified elsewhere: Secondary | ICD-10-CM | POA: Diagnosis not present

## 2020-02-29 DIAGNOSIS — T8142XA Infection following a procedure, deep incisional surgical site, initial encounter: Secondary | ICD-10-CM | POA: Diagnosis not present

## 2020-02-29 DIAGNOSIS — M47813 Spondylosis without myelopathy or radiculopathy, cervicothoracic region: Secondary | ICD-10-CM | POA: Diagnosis not present

## 2020-03-03 DIAGNOSIS — D539 Nutritional anemia, unspecified: Secondary | ICD-10-CM | POA: Diagnosis not present

## 2020-03-03 DIAGNOSIS — T8142XA Infection following a procedure, deep incisional surgical site, initial encounter: Secondary | ICD-10-CM | POA: Diagnosis not present

## 2020-03-03 DIAGNOSIS — M47813 Spondylosis without myelopathy or radiculopathy, cervicothoracic region: Secondary | ICD-10-CM | POA: Diagnosis not present

## 2020-03-03 DIAGNOSIS — R739 Hyperglycemia, unspecified: Secondary | ICD-10-CM | POA: Diagnosis not present

## 2020-03-03 DIAGNOSIS — G8929 Other chronic pain: Secondary | ICD-10-CM | POA: Diagnosis not present

## 2020-03-03 DIAGNOSIS — E785 Hyperlipidemia, unspecified: Secondary | ICD-10-CM | POA: Diagnosis not present

## 2020-03-03 DIAGNOSIS — T84296D Other mechanical complication of internal fixation device of vertebrae, subsequent encounter: Secondary | ICD-10-CM | POA: Diagnosis not present

## 2020-03-03 DIAGNOSIS — T8132XD Disruption of internal operation (surgical) wound, not elsewhere classified, subsequent encounter: Secondary | ICD-10-CM | POA: Diagnosis not present

## 2020-03-03 DIAGNOSIS — M5442 Lumbago with sciatica, left side: Secondary | ICD-10-CM | POA: Diagnosis not present

## 2020-03-03 DIAGNOSIS — B961 Klebsiella pneumoniae [K. pneumoniae] as the cause of diseases classified elsewhere: Secondary | ICD-10-CM | POA: Diagnosis not present

## 2020-03-03 DIAGNOSIS — Z6831 Body mass index (BMI) 31.0-31.9, adult: Secondary | ICD-10-CM | POA: Diagnosis not present

## 2020-03-03 DIAGNOSIS — M960 Pseudarthrosis after fusion or arthrodesis: Secondary | ICD-10-CM | POA: Diagnosis not present

## 2020-03-03 DIAGNOSIS — E559 Vitamin D deficiency, unspecified: Secondary | ICD-10-CM | POA: Diagnosis not present

## 2020-03-03 DIAGNOSIS — I10 Essential (primary) hypertension: Secondary | ICD-10-CM | POA: Diagnosis not present

## 2020-03-03 DIAGNOSIS — F321 Major depressive disorder, single episode, moderate: Secondary | ICD-10-CM | POA: Diagnosis not present

## 2020-03-03 DIAGNOSIS — M1990 Unspecified osteoarthritis, unspecified site: Secondary | ICD-10-CM | POA: Diagnosis not present

## 2020-03-03 DIAGNOSIS — M5441 Lumbago with sciatica, right side: Secondary | ICD-10-CM | POA: Diagnosis not present

## 2020-03-03 DIAGNOSIS — F419 Anxiety disorder, unspecified: Secondary | ICD-10-CM | POA: Diagnosis not present

## 2020-03-04 DIAGNOSIS — M47813 Spondylosis without myelopathy or radiculopathy, cervicothoracic region: Secondary | ICD-10-CM | POA: Diagnosis not present

## 2020-03-04 DIAGNOSIS — T8142XA Infection following a procedure, deep incisional surgical site, initial encounter: Secondary | ICD-10-CM | POA: Diagnosis not present

## 2020-03-04 DIAGNOSIS — T84296D Other mechanical complication of internal fixation device of vertebrae, subsequent encounter: Secondary | ICD-10-CM | POA: Diagnosis not present

## 2020-03-04 DIAGNOSIS — M960 Pseudarthrosis after fusion or arthrodesis: Secondary | ICD-10-CM | POA: Diagnosis not present

## 2020-03-04 DIAGNOSIS — T8132XD Disruption of internal operation (surgical) wound, not elsewhere classified, subsequent encounter: Secondary | ICD-10-CM | POA: Diagnosis not present

## 2020-03-04 DIAGNOSIS — B961 Klebsiella pneumoniae [K. pneumoniae] as the cause of diseases classified elsewhere: Secondary | ICD-10-CM | POA: Diagnosis not present

## 2020-03-07 DIAGNOSIS — J45909 Unspecified asthma, uncomplicated: Secondary | ICD-10-CM | POA: Diagnosis not present

## 2020-03-07 DIAGNOSIS — T84296D Other mechanical complication of internal fixation device of vertebrae, subsequent encounter: Secondary | ICD-10-CM | POA: Diagnosis not present

## 2020-03-07 DIAGNOSIS — Z9181 History of falling: Secondary | ICD-10-CM | POA: Diagnosis not present

## 2020-03-07 DIAGNOSIS — G3184 Mild cognitive impairment, so stated: Secondary | ICD-10-CM | POA: Diagnosis not present

## 2020-03-07 DIAGNOSIS — G8929 Other chronic pain: Secondary | ICD-10-CM | POA: Diagnosis not present

## 2020-03-07 DIAGNOSIS — I341 Nonrheumatic mitral (valve) prolapse: Secondary | ICD-10-CM | POA: Diagnosis not present

## 2020-03-07 DIAGNOSIS — G4700 Insomnia, unspecified: Secondary | ICD-10-CM | POA: Diagnosis not present

## 2020-03-07 DIAGNOSIS — M549 Dorsalgia, unspecified: Secondary | ICD-10-CM | POA: Diagnosis not present

## 2020-03-07 DIAGNOSIS — K219 Gastro-esophageal reflux disease without esophagitis: Secondary | ICD-10-CM | POA: Diagnosis not present

## 2020-03-07 DIAGNOSIS — N3281 Overactive bladder: Secondary | ICD-10-CM | POA: Diagnosis not present

## 2020-03-07 DIAGNOSIS — M439 Deforming dorsopathy, unspecified: Secondary | ICD-10-CM | POA: Diagnosis not present

## 2020-03-07 DIAGNOSIS — M810 Age-related osteoporosis without current pathological fracture: Secondary | ICD-10-CM | POA: Diagnosis not present

## 2020-03-07 DIAGNOSIS — M4800 Spinal stenosis, site unspecified: Secondary | ICD-10-CM | POA: Diagnosis not present

## 2020-03-07 DIAGNOSIS — M960 Pseudarthrosis after fusion or arthrodesis: Secondary | ICD-10-CM | POA: Diagnosis not present

## 2020-03-07 DIAGNOSIS — R296 Repeated falls: Secondary | ICD-10-CM | POA: Diagnosis not present

## 2020-03-07 DIAGNOSIS — M47813 Spondylosis without myelopathy or radiculopathy, cervicothoracic region: Secondary | ICD-10-CM | POA: Diagnosis not present

## 2020-03-07 DIAGNOSIS — I10 Essential (primary) hypertension: Secondary | ICD-10-CM | POA: Diagnosis not present

## 2020-03-07 DIAGNOSIS — M170 Bilateral primary osteoarthritis of knee: Secondary | ICD-10-CM | POA: Diagnosis not present

## 2020-03-07 DIAGNOSIS — F419 Anxiety disorder, unspecified: Secondary | ICD-10-CM | POA: Diagnosis not present

## 2020-03-07 DIAGNOSIS — E46 Unspecified protein-calorie malnutrition: Secondary | ICD-10-CM | POA: Diagnosis not present

## 2020-03-11 DIAGNOSIS — M170 Bilateral primary osteoarthritis of knee: Secondary | ICD-10-CM | POA: Diagnosis not present

## 2020-03-11 DIAGNOSIS — M47813 Spondylosis without myelopathy or radiculopathy, cervicothoracic region: Secondary | ICD-10-CM | POA: Diagnosis not present

## 2020-03-11 DIAGNOSIS — I10 Essential (primary) hypertension: Secondary | ICD-10-CM | POA: Diagnosis not present

## 2020-03-11 DIAGNOSIS — T84296D Other mechanical complication of internal fixation device of vertebrae, subsequent encounter: Secondary | ICD-10-CM | POA: Diagnosis not present

## 2020-03-11 DIAGNOSIS — M960 Pseudarthrosis after fusion or arthrodesis: Secondary | ICD-10-CM | POA: Diagnosis not present

## 2020-03-11 DIAGNOSIS — I341 Nonrheumatic mitral (valve) prolapse: Secondary | ICD-10-CM | POA: Diagnosis not present

## 2020-03-14 DIAGNOSIS — N390 Urinary tract infection, site not specified: Secondary | ICD-10-CM | POA: Diagnosis not present

## 2020-03-14 DIAGNOSIS — Z6831 Body mass index (BMI) 31.0-31.9, adult: Secondary | ICD-10-CM | POA: Diagnosis not present

## 2020-03-17 DIAGNOSIS — I10 Essential (primary) hypertension: Secondary | ICD-10-CM | POA: Diagnosis not present

## 2020-03-17 DIAGNOSIS — M170 Bilateral primary osteoarthritis of knee: Secondary | ICD-10-CM | POA: Diagnosis not present

## 2020-03-17 DIAGNOSIS — M960 Pseudarthrosis after fusion or arthrodesis: Secondary | ICD-10-CM | POA: Diagnosis not present

## 2020-03-17 DIAGNOSIS — M47813 Spondylosis without myelopathy or radiculopathy, cervicothoracic region: Secondary | ICD-10-CM | POA: Diagnosis not present

## 2020-03-17 DIAGNOSIS — I341 Nonrheumatic mitral (valve) prolapse: Secondary | ICD-10-CM | POA: Diagnosis not present

## 2020-03-17 DIAGNOSIS — T84296D Other mechanical complication of internal fixation device of vertebrae, subsequent encounter: Secondary | ICD-10-CM | POA: Diagnosis not present

## 2020-03-25 DIAGNOSIS — I10 Essential (primary) hypertension: Secondary | ICD-10-CM | POA: Diagnosis not present

## 2020-03-25 DIAGNOSIS — M47813 Spondylosis without myelopathy or radiculopathy, cervicothoracic region: Secondary | ICD-10-CM | POA: Diagnosis not present

## 2020-03-25 DIAGNOSIS — M960 Pseudarthrosis after fusion or arthrodesis: Secondary | ICD-10-CM | POA: Diagnosis not present

## 2020-03-25 DIAGNOSIS — I341 Nonrheumatic mitral (valve) prolapse: Secondary | ICD-10-CM | POA: Diagnosis not present

## 2020-03-25 DIAGNOSIS — T84296D Other mechanical complication of internal fixation device of vertebrae, subsequent encounter: Secondary | ICD-10-CM | POA: Diagnosis not present

## 2020-03-25 DIAGNOSIS — M170 Bilateral primary osteoarthritis of knee: Secondary | ICD-10-CM | POA: Diagnosis not present

## 2020-04-01 DIAGNOSIS — Z1231 Encounter for screening mammogram for malignant neoplasm of breast: Secondary | ICD-10-CM | POA: Diagnosis not present

## 2020-04-03 DIAGNOSIS — T84296D Other mechanical complication of internal fixation device of vertebrae, subsequent encounter: Secondary | ICD-10-CM | POA: Diagnosis not present

## 2020-04-03 DIAGNOSIS — M47813 Spondylosis without myelopathy or radiculopathy, cervicothoracic region: Secondary | ICD-10-CM | POA: Diagnosis not present

## 2020-04-03 DIAGNOSIS — M960 Pseudarthrosis after fusion or arthrodesis: Secondary | ICD-10-CM | POA: Diagnosis not present

## 2020-04-03 DIAGNOSIS — M170 Bilateral primary osteoarthritis of knee: Secondary | ICD-10-CM | POA: Diagnosis not present

## 2020-04-03 DIAGNOSIS — I341 Nonrheumatic mitral (valve) prolapse: Secondary | ICD-10-CM | POA: Diagnosis not present

## 2020-04-03 DIAGNOSIS — I10 Essential (primary) hypertension: Secondary | ICD-10-CM | POA: Diagnosis not present

## 2020-04-04 DIAGNOSIS — N390 Urinary tract infection, site not specified: Secondary | ICD-10-CM | POA: Diagnosis not present

## 2020-04-06 DIAGNOSIS — F419 Anxiety disorder, unspecified: Secondary | ICD-10-CM | POA: Diagnosis not present

## 2020-04-06 DIAGNOSIS — R296 Repeated falls: Secondary | ICD-10-CM | POA: Diagnosis not present

## 2020-04-06 DIAGNOSIS — M47813 Spondylosis without myelopathy or radiculopathy, cervicothoracic region: Secondary | ICD-10-CM | POA: Diagnosis not present

## 2020-04-06 DIAGNOSIS — M960 Pseudarthrosis after fusion or arthrodesis: Secondary | ICD-10-CM | POA: Diagnosis not present

## 2020-04-06 DIAGNOSIS — N3281 Overactive bladder: Secondary | ICD-10-CM | POA: Diagnosis not present

## 2020-04-06 DIAGNOSIS — G4700 Insomnia, unspecified: Secondary | ICD-10-CM | POA: Diagnosis not present

## 2020-04-06 DIAGNOSIS — J45909 Unspecified asthma, uncomplicated: Secondary | ICD-10-CM | POA: Diagnosis not present

## 2020-04-06 DIAGNOSIS — M4800 Spinal stenosis, site unspecified: Secondary | ICD-10-CM | POA: Diagnosis not present

## 2020-04-06 DIAGNOSIS — G8929 Other chronic pain: Secondary | ICD-10-CM | POA: Diagnosis not present

## 2020-04-06 DIAGNOSIS — I341 Nonrheumatic mitral (valve) prolapse: Secondary | ICD-10-CM | POA: Diagnosis not present

## 2020-04-06 DIAGNOSIS — M810 Age-related osteoporosis without current pathological fracture: Secondary | ICD-10-CM | POA: Diagnosis not present

## 2020-04-06 DIAGNOSIS — G3184 Mild cognitive impairment, so stated: Secondary | ICD-10-CM | POA: Diagnosis not present

## 2020-04-06 DIAGNOSIS — Z9181 History of falling: Secondary | ICD-10-CM | POA: Diagnosis not present

## 2020-04-06 DIAGNOSIS — I10 Essential (primary) hypertension: Secondary | ICD-10-CM | POA: Diagnosis not present

## 2020-04-06 DIAGNOSIS — M170 Bilateral primary osteoarthritis of knee: Secondary | ICD-10-CM | POA: Diagnosis not present

## 2020-04-06 DIAGNOSIS — T84296D Other mechanical complication of internal fixation device of vertebrae, subsequent encounter: Secondary | ICD-10-CM | POA: Diagnosis not present

## 2020-04-06 DIAGNOSIS — K219 Gastro-esophageal reflux disease without esophagitis: Secondary | ICD-10-CM | POA: Diagnosis not present

## 2020-04-06 DIAGNOSIS — M439 Deforming dorsopathy, unspecified: Secondary | ICD-10-CM | POA: Diagnosis not present

## 2020-04-06 DIAGNOSIS — E46 Unspecified protein-calorie malnutrition: Secondary | ICD-10-CM | POA: Diagnosis not present

## 2020-04-06 DIAGNOSIS — M549 Dorsalgia, unspecified: Secondary | ICD-10-CM | POA: Diagnosis not present

## 2020-04-10 DIAGNOSIS — J4530 Mild persistent asthma, uncomplicated: Secondary | ICD-10-CM | POA: Diagnosis not present

## 2020-04-10 DIAGNOSIS — J309 Allergic rhinitis, unspecified: Secondary | ICD-10-CM | POA: Diagnosis not present

## 2020-04-14 DIAGNOSIS — N390 Urinary tract infection, site not specified: Secondary | ICD-10-CM | POA: Diagnosis not present

## 2020-04-14 DIAGNOSIS — R310 Gross hematuria: Secondary | ICD-10-CM | POA: Diagnosis not present

## 2020-04-14 DIAGNOSIS — N3000 Acute cystitis without hematuria: Secondary | ICD-10-CM | POA: Diagnosis not present

## 2020-04-16 DIAGNOSIS — M170 Bilateral primary osteoarthritis of knee: Secondary | ICD-10-CM | POA: Diagnosis not present

## 2020-04-16 DIAGNOSIS — T84296D Other mechanical complication of internal fixation device of vertebrae, subsequent encounter: Secondary | ICD-10-CM | POA: Diagnosis not present

## 2020-04-16 DIAGNOSIS — M47813 Spondylosis without myelopathy or radiculopathy, cervicothoracic region: Secondary | ICD-10-CM | POA: Diagnosis not present

## 2020-04-16 DIAGNOSIS — M960 Pseudarthrosis after fusion or arthrodesis: Secondary | ICD-10-CM | POA: Diagnosis not present

## 2020-04-16 DIAGNOSIS — I341 Nonrheumatic mitral (valve) prolapse: Secondary | ICD-10-CM | POA: Diagnosis not present

## 2020-04-16 DIAGNOSIS — I10 Essential (primary) hypertension: Secondary | ICD-10-CM | POA: Diagnosis not present

## 2020-04-17 DIAGNOSIS — R059 Cough, unspecified: Secondary | ICD-10-CM | POA: Diagnosis not present

## 2020-04-17 DIAGNOSIS — J4530 Mild persistent asthma, uncomplicated: Secondary | ICD-10-CM | POA: Diagnosis not present

## 2020-04-21 DIAGNOSIS — M170 Bilateral primary osteoarthritis of knee: Secondary | ICD-10-CM | POA: Diagnosis not present

## 2020-04-21 DIAGNOSIS — T84296D Other mechanical complication of internal fixation device of vertebrae, subsequent encounter: Secondary | ICD-10-CM | POA: Diagnosis not present

## 2020-04-21 DIAGNOSIS — M47813 Spondylosis without myelopathy or radiculopathy, cervicothoracic region: Secondary | ICD-10-CM | POA: Diagnosis not present

## 2020-04-21 DIAGNOSIS — M960 Pseudarthrosis after fusion or arthrodesis: Secondary | ICD-10-CM | POA: Diagnosis not present

## 2020-04-21 DIAGNOSIS — I341 Nonrheumatic mitral (valve) prolapse: Secondary | ICD-10-CM | POA: Diagnosis not present

## 2020-04-21 DIAGNOSIS — I10 Essential (primary) hypertension: Secondary | ICD-10-CM | POA: Diagnosis not present

## 2020-04-30 DIAGNOSIS — J4540 Moderate persistent asthma, uncomplicated: Secondary | ICD-10-CM | POA: Diagnosis not present

## 2020-05-09 DIAGNOSIS — R7401 Elevation of levels of liver transaminase levels: Secondary | ICD-10-CM | POA: Diagnosis not present

## 2020-05-09 DIAGNOSIS — E7800 Pure hypercholesterolemia, unspecified: Secondary | ICD-10-CM | POA: Diagnosis present

## 2020-05-09 DIAGNOSIS — D509 Iron deficiency anemia, unspecified: Secondary | ICD-10-CM | POA: Diagnosis present

## 2020-05-09 DIAGNOSIS — Z8673 Personal history of transient ischemic attack (TIA), and cerebral infarction without residual deficits: Secondary | ICD-10-CM | POA: Diagnosis not present

## 2020-05-09 DIAGNOSIS — K219 Gastro-esophageal reflux disease without esophagitis: Secondary | ICD-10-CM | POA: Diagnosis present

## 2020-05-09 DIAGNOSIS — Z20822 Contact with and (suspected) exposure to covid-19: Secondary | ICD-10-CM | POA: Diagnosis present

## 2020-05-09 DIAGNOSIS — R109 Unspecified abdominal pain: Secondary | ICD-10-CM | POA: Diagnosis not present

## 2020-05-09 DIAGNOSIS — R0600 Dyspnea, unspecified: Secondary | ICD-10-CM | POA: Diagnosis not present

## 2020-05-09 DIAGNOSIS — F419 Anxiety disorder, unspecified: Secondary | ICD-10-CM | POA: Diagnosis present

## 2020-05-09 DIAGNOSIS — R000 Tachycardia, unspecified: Secondary | ICD-10-CM | POA: Diagnosis not present

## 2020-05-09 DIAGNOSIS — Z79899 Other long term (current) drug therapy: Secondary | ICD-10-CM | POA: Diagnosis not present

## 2020-05-09 DIAGNOSIS — F32A Depression, unspecified: Secondary | ICD-10-CM | POA: Diagnosis present

## 2020-05-09 DIAGNOSIS — J9811 Atelectasis: Secondary | ICD-10-CM | POA: Diagnosis not present

## 2020-05-09 DIAGNOSIS — K7689 Other specified diseases of liver: Secondary | ICD-10-CM | POA: Diagnosis not present

## 2020-05-09 DIAGNOSIS — R0689 Other abnormalities of breathing: Secondary | ICD-10-CM | POA: Diagnosis not present

## 2020-05-09 DIAGNOSIS — Z7982 Long term (current) use of aspirin: Secondary | ICD-10-CM | POA: Diagnosis not present

## 2020-05-09 DIAGNOSIS — M159 Polyosteoarthritis, unspecified: Secondary | ICD-10-CM | POA: Diagnosis present

## 2020-05-09 DIAGNOSIS — N3289 Other specified disorders of bladder: Secondary | ICD-10-CM | POA: Diagnosis not present

## 2020-05-09 DIAGNOSIS — I4510 Unspecified right bundle-branch block: Secondary | ICD-10-CM | POA: Diagnosis not present

## 2020-05-09 DIAGNOSIS — R7989 Other specified abnormal findings of blood chemistry: Secondary | ICD-10-CM | POA: Diagnosis not present

## 2020-05-09 DIAGNOSIS — E860 Dehydration: Secondary | ICD-10-CM | POA: Diagnosis present

## 2020-05-09 DIAGNOSIS — B179 Acute viral hepatitis, unspecified: Secondary | ICD-10-CM | POA: Diagnosis present

## 2020-05-09 DIAGNOSIS — A419 Sepsis, unspecified organism: Secondary | ICD-10-CM | POA: Diagnosis not present

## 2020-05-09 DIAGNOSIS — J9601 Acute respiratory failure with hypoxia: Secondary | ICD-10-CM | POA: Diagnosis present

## 2020-05-09 DIAGNOSIS — R0902 Hypoxemia: Secondary | ICD-10-CM | POA: Diagnosis not present

## 2020-05-09 DIAGNOSIS — Z888 Allergy status to other drugs, medicaments and biological substances status: Secondary | ICD-10-CM | POA: Diagnosis not present

## 2020-05-09 DIAGNOSIS — E785 Hyperlipidemia, unspecified: Secondary | ICD-10-CM | POA: Diagnosis present

## 2020-05-09 DIAGNOSIS — R059 Cough, unspecified: Secondary | ICD-10-CM | POA: Diagnosis not present

## 2020-05-09 DIAGNOSIS — J440 Chronic obstructive pulmonary disease with acute lower respiratory infection: Secondary | ICD-10-CM | POA: Diagnosis present

## 2020-05-09 DIAGNOSIS — I2510 Atherosclerotic heart disease of native coronary artery without angina pectoris: Secondary | ICD-10-CM | POA: Diagnosis present

## 2020-05-09 DIAGNOSIS — J441 Chronic obstructive pulmonary disease with (acute) exacerbation: Secondary | ICD-10-CM | POA: Diagnosis not present

## 2020-05-09 DIAGNOSIS — I10 Essential (primary) hypertension: Secondary | ICD-10-CM | POA: Diagnosis not present

## 2020-05-09 DIAGNOSIS — J189 Pneumonia, unspecified organism: Secondary | ICD-10-CM | POA: Diagnosis present

## 2020-05-09 DIAGNOSIS — R0602 Shortness of breath: Secondary | ICD-10-CM | POA: Diagnosis not present

## 2020-05-14 DIAGNOSIS — I10 Essential (primary) hypertension: Secondary | ICD-10-CM | POA: Diagnosis not present

## 2020-05-14 DIAGNOSIS — G40909 Epilepsy, unspecified, not intractable, without status epilepticus: Secondary | ICD-10-CM | POA: Diagnosis not present

## 2020-05-14 DIAGNOSIS — Z87440 Personal history of urinary (tract) infections: Secondary | ICD-10-CM | POA: Diagnosis not present

## 2020-05-14 DIAGNOSIS — N289 Disorder of kidney and ureter, unspecified: Secondary | ICD-10-CM | POA: Diagnosis not present

## 2020-05-14 DIAGNOSIS — Z9181 History of falling: Secondary | ICD-10-CM | POA: Diagnosis not present

## 2020-05-14 DIAGNOSIS — K219 Gastro-esophageal reflux disease without esophagitis: Secondary | ICD-10-CM | POA: Diagnosis not present

## 2020-05-14 DIAGNOSIS — D649 Anemia, unspecified: Secondary | ICD-10-CM | POA: Diagnosis not present

## 2020-05-14 DIAGNOSIS — F32A Depression, unspecified: Secondary | ICD-10-CM | POA: Diagnosis not present

## 2020-05-14 DIAGNOSIS — Z8673 Personal history of transient ischemic attack (TIA), and cerebral infarction without residual deficits: Secondary | ICD-10-CM | POA: Diagnosis not present

## 2020-05-14 DIAGNOSIS — M479 Spondylosis, unspecified: Secondary | ICD-10-CM | POA: Diagnosis not present

## 2020-05-14 DIAGNOSIS — J449 Chronic obstructive pulmonary disease, unspecified: Secondary | ICD-10-CM | POA: Diagnosis not present

## 2020-05-14 DIAGNOSIS — I2510 Atherosclerotic heart disease of native coronary artery without angina pectoris: Secondary | ICD-10-CM | POA: Diagnosis not present

## 2020-05-14 DIAGNOSIS — M150 Primary generalized (osteo)arthritis: Secondary | ICD-10-CM | POA: Diagnosis not present

## 2020-05-14 DIAGNOSIS — K759 Inflammatory liver disease, unspecified: Secondary | ICD-10-CM | POA: Diagnosis not present

## 2020-05-14 DIAGNOSIS — F419 Anxiety disorder, unspecified: Secondary | ICD-10-CM | POA: Diagnosis not present

## 2020-05-14 DIAGNOSIS — E785 Hyperlipidemia, unspecified: Secondary | ICD-10-CM | POA: Diagnosis not present

## 2020-05-15 DIAGNOSIS — G40909 Epilepsy, unspecified, not intractable, without status epilepticus: Secondary | ICD-10-CM | POA: Diagnosis not present

## 2020-05-15 DIAGNOSIS — A4189 Other specified sepsis: Secondary | ICD-10-CM | POA: Diagnosis not present

## 2020-05-15 DIAGNOSIS — J4530 Mild persistent asthma, uncomplicated: Secondary | ICD-10-CM | POA: Diagnosis not present

## 2020-05-15 DIAGNOSIS — M5441 Lumbago with sciatica, right side: Secondary | ICD-10-CM | POA: Diagnosis not present

## 2020-05-15 DIAGNOSIS — M5442 Lumbago with sciatica, left side: Secondary | ICD-10-CM | POA: Diagnosis not present

## 2020-05-15 DIAGNOSIS — J9601 Acute respiratory failure with hypoxia: Secondary | ICD-10-CM | POA: Diagnosis not present

## 2020-05-15 DIAGNOSIS — R531 Weakness: Secondary | ICD-10-CM | POA: Diagnosis not present

## 2020-05-15 DIAGNOSIS — K7200 Acute and subacute hepatic failure without coma: Secondary | ICD-10-CM | POA: Diagnosis not present

## 2020-05-15 DIAGNOSIS — G8929 Other chronic pain: Secondary | ICD-10-CM | POA: Diagnosis not present

## 2020-05-15 DIAGNOSIS — I2510 Atherosclerotic heart disease of native coronary artery without angina pectoris: Secondary | ICD-10-CM | POA: Diagnosis not present

## 2020-05-15 DIAGNOSIS — I10 Essential (primary) hypertension: Secondary | ICD-10-CM | POA: Diagnosis not present

## 2020-05-15 DIAGNOSIS — J449 Chronic obstructive pulmonary disease, unspecified: Secondary | ICD-10-CM | POA: Diagnosis not present

## 2020-05-15 DIAGNOSIS — F419 Anxiety disorder, unspecified: Secondary | ICD-10-CM | POA: Diagnosis not present

## 2020-05-15 DIAGNOSIS — K759 Inflammatory liver disease, unspecified: Secondary | ICD-10-CM | POA: Diagnosis not present

## 2020-05-15 DIAGNOSIS — Z79899 Other long term (current) drug therapy: Secondary | ICD-10-CM | POA: Diagnosis not present

## 2020-05-15 DIAGNOSIS — Z6832 Body mass index (BMI) 32.0-32.9, adult: Secondary | ICD-10-CM | POA: Diagnosis not present

## 2020-05-19 DIAGNOSIS — F419 Anxiety disorder, unspecified: Secondary | ICD-10-CM | POA: Diagnosis not present

## 2020-05-19 DIAGNOSIS — K759 Inflammatory liver disease, unspecified: Secondary | ICD-10-CM | POA: Diagnosis not present

## 2020-05-19 DIAGNOSIS — G40909 Epilepsy, unspecified, not intractable, without status epilepticus: Secondary | ICD-10-CM | POA: Diagnosis not present

## 2020-05-19 DIAGNOSIS — I2510 Atherosclerotic heart disease of native coronary artery without angina pectoris: Secondary | ICD-10-CM | POA: Diagnosis not present

## 2020-05-19 DIAGNOSIS — I10 Essential (primary) hypertension: Secondary | ICD-10-CM | POA: Diagnosis not present

## 2020-05-19 DIAGNOSIS — J449 Chronic obstructive pulmonary disease, unspecified: Secondary | ICD-10-CM | POA: Diagnosis not present

## 2020-05-21 DIAGNOSIS — I10 Essential (primary) hypertension: Secondary | ICD-10-CM | POA: Diagnosis not present

## 2020-05-21 DIAGNOSIS — F419 Anxiety disorder, unspecified: Secondary | ICD-10-CM | POA: Diagnosis not present

## 2020-05-21 DIAGNOSIS — J449 Chronic obstructive pulmonary disease, unspecified: Secondary | ICD-10-CM | POA: Diagnosis not present

## 2020-05-21 DIAGNOSIS — K759 Inflammatory liver disease, unspecified: Secondary | ICD-10-CM | POA: Diagnosis not present

## 2020-05-21 DIAGNOSIS — I2510 Atherosclerotic heart disease of native coronary artery without angina pectoris: Secondary | ICD-10-CM | POA: Diagnosis not present

## 2020-05-21 DIAGNOSIS — G40909 Epilepsy, unspecified, not intractable, without status epilepticus: Secondary | ICD-10-CM | POA: Diagnosis not present

## 2020-05-26 DIAGNOSIS — I2510 Atherosclerotic heart disease of native coronary artery without angina pectoris: Secondary | ICD-10-CM | POA: Diagnosis not present

## 2020-05-26 DIAGNOSIS — I10 Essential (primary) hypertension: Secondary | ICD-10-CM | POA: Diagnosis not present

## 2020-05-26 DIAGNOSIS — F419 Anxiety disorder, unspecified: Secondary | ICD-10-CM | POA: Diagnosis not present

## 2020-05-26 DIAGNOSIS — G40909 Epilepsy, unspecified, not intractable, without status epilepticus: Secondary | ICD-10-CM | POA: Diagnosis not present

## 2020-05-26 DIAGNOSIS — J449 Chronic obstructive pulmonary disease, unspecified: Secondary | ICD-10-CM | POA: Diagnosis not present

## 2020-05-26 DIAGNOSIS — K759 Inflammatory liver disease, unspecified: Secondary | ICD-10-CM | POA: Diagnosis not present

## 2020-05-29 DIAGNOSIS — E538 Deficiency of other specified B group vitamins: Secondary | ICD-10-CM | POA: Diagnosis not present

## 2020-05-29 DIAGNOSIS — R531 Weakness: Secondary | ICD-10-CM | POA: Diagnosis not present

## 2020-05-29 DIAGNOSIS — I10 Essential (primary) hypertension: Secondary | ICD-10-CM | POA: Diagnosis not present

## 2020-05-29 DIAGNOSIS — K7200 Acute and subacute hepatic failure without coma: Secondary | ICD-10-CM | POA: Diagnosis not present

## 2020-05-29 DIAGNOSIS — F321 Major depressive disorder, single episode, moderate: Secondary | ICD-10-CM | POA: Diagnosis not present

## 2020-05-30 DIAGNOSIS — K759 Inflammatory liver disease, unspecified: Secondary | ICD-10-CM | POA: Diagnosis not present

## 2020-05-30 DIAGNOSIS — I10 Essential (primary) hypertension: Secondary | ICD-10-CM | POA: Diagnosis not present

## 2020-05-30 DIAGNOSIS — F419 Anxiety disorder, unspecified: Secondary | ICD-10-CM | POA: Diagnosis not present

## 2020-05-30 DIAGNOSIS — G40909 Epilepsy, unspecified, not intractable, without status epilepticus: Secondary | ICD-10-CM | POA: Diagnosis not present

## 2020-05-30 DIAGNOSIS — J449 Chronic obstructive pulmonary disease, unspecified: Secondary | ICD-10-CM | POA: Diagnosis not present

## 2020-05-30 DIAGNOSIS — I2510 Atherosclerotic heart disease of native coronary artery without angina pectoris: Secondary | ICD-10-CM | POA: Diagnosis not present

## 2020-06-02 DIAGNOSIS — F419 Anxiety disorder, unspecified: Secondary | ICD-10-CM | POA: Diagnosis not present

## 2020-06-02 DIAGNOSIS — G40909 Epilepsy, unspecified, not intractable, without status epilepticus: Secondary | ICD-10-CM | POA: Diagnosis not present

## 2020-06-02 DIAGNOSIS — K759 Inflammatory liver disease, unspecified: Secondary | ICD-10-CM | POA: Diagnosis not present

## 2020-06-02 DIAGNOSIS — I10 Essential (primary) hypertension: Secondary | ICD-10-CM | POA: Diagnosis not present

## 2020-06-02 DIAGNOSIS — I2510 Atherosclerotic heart disease of native coronary artery without angina pectoris: Secondary | ICD-10-CM | POA: Diagnosis not present

## 2020-06-02 DIAGNOSIS — J449 Chronic obstructive pulmonary disease, unspecified: Secondary | ICD-10-CM | POA: Diagnosis not present

## 2020-06-03 DIAGNOSIS — Z9181 History of falling: Secondary | ICD-10-CM | POA: Diagnosis not present

## 2020-06-03 DIAGNOSIS — Z0000 Encounter for general adult medical examination without abnormal findings: Secondary | ICD-10-CM | POA: Diagnosis not present

## 2020-06-03 DIAGNOSIS — E785 Hyperlipidemia, unspecified: Secondary | ICD-10-CM | POA: Diagnosis not present

## 2020-06-03 DIAGNOSIS — Z1331 Encounter for screening for depression: Secondary | ICD-10-CM | POA: Diagnosis not present

## 2020-06-04 DIAGNOSIS — R748 Abnormal levels of other serum enzymes: Secondary | ICD-10-CM | POA: Diagnosis not present

## 2020-06-04 DIAGNOSIS — L299 Pruritus, unspecified: Secondary | ICD-10-CM | POA: Diagnosis not present

## 2020-06-04 DIAGNOSIS — M4324 Fusion of spine, thoracic region: Secondary | ICD-10-CM | POA: Diagnosis not present

## 2020-06-04 DIAGNOSIS — M47814 Spondylosis without myelopathy or radiculopathy, thoracic region: Secondary | ICD-10-CM | POA: Diagnosis not present

## 2020-06-04 DIAGNOSIS — Z981 Arthrodesis status: Secondary | ICD-10-CM | POA: Diagnosis not present

## 2020-06-05 DIAGNOSIS — R531 Weakness: Secondary | ICD-10-CM | POA: Diagnosis not present

## 2020-06-05 DIAGNOSIS — M549 Dorsalgia, unspecified: Secondary | ICD-10-CM | POA: Diagnosis not present

## 2020-06-05 DIAGNOSIS — E538 Deficiency of other specified B group vitamins: Secondary | ICD-10-CM | POA: Diagnosis not present

## 2020-06-05 DIAGNOSIS — K7200 Acute and subacute hepatic failure without coma: Secondary | ICD-10-CM | POA: Diagnosis not present

## 2020-06-06 DIAGNOSIS — F419 Anxiety disorder, unspecified: Secondary | ICD-10-CM | POA: Diagnosis not present

## 2020-06-06 DIAGNOSIS — K759 Inflammatory liver disease, unspecified: Secondary | ICD-10-CM | POA: Diagnosis not present

## 2020-06-06 DIAGNOSIS — I10 Essential (primary) hypertension: Secondary | ICD-10-CM | POA: Diagnosis not present

## 2020-06-06 DIAGNOSIS — I2510 Atherosclerotic heart disease of native coronary artery without angina pectoris: Secondary | ICD-10-CM | POA: Diagnosis not present

## 2020-06-06 DIAGNOSIS — G40909 Epilepsy, unspecified, not intractable, without status epilepticus: Secondary | ICD-10-CM | POA: Diagnosis not present

## 2020-06-06 DIAGNOSIS — J449 Chronic obstructive pulmonary disease, unspecified: Secondary | ICD-10-CM | POA: Diagnosis not present

## 2020-06-09 DIAGNOSIS — G40909 Epilepsy, unspecified, not intractable, without status epilepticus: Secondary | ICD-10-CM | POA: Diagnosis not present

## 2020-06-09 DIAGNOSIS — J449 Chronic obstructive pulmonary disease, unspecified: Secondary | ICD-10-CM | POA: Diagnosis not present

## 2020-06-09 DIAGNOSIS — I10 Essential (primary) hypertension: Secondary | ICD-10-CM | POA: Diagnosis not present

## 2020-06-09 DIAGNOSIS — K759 Inflammatory liver disease, unspecified: Secondary | ICD-10-CM | POA: Diagnosis not present

## 2020-06-09 DIAGNOSIS — I2510 Atherosclerotic heart disease of native coronary artery without angina pectoris: Secondary | ICD-10-CM | POA: Diagnosis not present

## 2020-06-09 DIAGNOSIS — F419 Anxiety disorder, unspecified: Secondary | ICD-10-CM | POA: Diagnosis not present

## 2020-06-12 DIAGNOSIS — K7200 Acute and subacute hepatic failure without coma: Secondary | ICD-10-CM | POA: Diagnosis not present

## 2020-06-12 DIAGNOSIS — N3281 Overactive bladder: Secondary | ICD-10-CM | POA: Diagnosis not present

## 2020-06-12 DIAGNOSIS — E538 Deficiency of other specified B group vitamins: Secondary | ICD-10-CM | POA: Diagnosis not present

## 2020-06-12 DIAGNOSIS — N390 Urinary tract infection, site not specified: Secondary | ICD-10-CM | POA: Diagnosis not present

## 2020-06-13 DIAGNOSIS — J449 Chronic obstructive pulmonary disease, unspecified: Secondary | ICD-10-CM | POA: Diagnosis not present

## 2020-06-13 DIAGNOSIS — K219 Gastro-esophageal reflux disease without esophagitis: Secondary | ICD-10-CM | POA: Diagnosis not present

## 2020-06-13 DIAGNOSIS — E785 Hyperlipidemia, unspecified: Secondary | ICD-10-CM | POA: Diagnosis not present

## 2020-06-13 DIAGNOSIS — N289 Disorder of kidney and ureter, unspecified: Secondary | ICD-10-CM | POA: Diagnosis not present

## 2020-06-13 DIAGNOSIS — Z8673 Personal history of transient ischemic attack (TIA), and cerebral infarction without residual deficits: Secondary | ICD-10-CM | POA: Diagnosis not present

## 2020-06-13 DIAGNOSIS — I10 Essential (primary) hypertension: Secondary | ICD-10-CM | POA: Diagnosis not present

## 2020-06-13 DIAGNOSIS — F32A Depression, unspecified: Secondary | ICD-10-CM | POA: Diagnosis not present

## 2020-06-13 DIAGNOSIS — F419 Anxiety disorder, unspecified: Secondary | ICD-10-CM | POA: Diagnosis not present

## 2020-06-13 DIAGNOSIS — Z87440 Personal history of urinary (tract) infections: Secondary | ICD-10-CM | POA: Diagnosis not present

## 2020-06-13 DIAGNOSIS — Z9181 History of falling: Secondary | ICD-10-CM | POA: Diagnosis not present

## 2020-06-13 DIAGNOSIS — G40909 Epilepsy, unspecified, not intractable, without status epilepticus: Secondary | ICD-10-CM | POA: Diagnosis not present

## 2020-06-13 DIAGNOSIS — M150 Primary generalized (osteo)arthritis: Secondary | ICD-10-CM | POA: Diagnosis not present

## 2020-06-13 DIAGNOSIS — M479 Spondylosis, unspecified: Secondary | ICD-10-CM | POA: Diagnosis not present

## 2020-06-13 DIAGNOSIS — D649 Anemia, unspecified: Secondary | ICD-10-CM | POA: Diagnosis not present

## 2020-06-13 DIAGNOSIS — K759 Inflammatory liver disease, unspecified: Secondary | ICD-10-CM | POA: Diagnosis not present

## 2020-06-13 DIAGNOSIS — I2510 Atherosclerotic heart disease of native coronary artery without angina pectoris: Secondary | ICD-10-CM | POA: Diagnosis not present

## 2020-06-20 DIAGNOSIS — G40909 Epilepsy, unspecified, not intractable, without status epilepticus: Secondary | ICD-10-CM | POA: Diagnosis not present

## 2020-06-20 DIAGNOSIS — I2510 Atherosclerotic heart disease of native coronary artery without angina pectoris: Secondary | ICD-10-CM | POA: Diagnosis not present

## 2020-06-20 DIAGNOSIS — I10 Essential (primary) hypertension: Secondary | ICD-10-CM | POA: Diagnosis not present

## 2020-06-20 DIAGNOSIS — F419 Anxiety disorder, unspecified: Secondary | ICD-10-CM | POA: Diagnosis not present

## 2020-06-20 DIAGNOSIS — K759 Inflammatory liver disease, unspecified: Secondary | ICD-10-CM | POA: Diagnosis not present

## 2020-06-20 DIAGNOSIS — J449 Chronic obstructive pulmonary disease, unspecified: Secondary | ICD-10-CM | POA: Diagnosis not present

## 2020-06-23 DIAGNOSIS — Z6832 Body mass index (BMI) 32.0-32.9, adult: Secondary | ICD-10-CM | POA: Diagnosis not present

## 2020-06-23 DIAGNOSIS — F321 Major depressive disorder, single episode, moderate: Secondary | ICD-10-CM | POA: Diagnosis not present

## 2020-06-23 DIAGNOSIS — K7200 Acute and subacute hepatic failure without coma: Secondary | ICD-10-CM | POA: Diagnosis not present

## 2020-06-23 DIAGNOSIS — G4700 Insomnia, unspecified: Secondary | ICD-10-CM | POA: Diagnosis not present

## 2020-06-23 DIAGNOSIS — E538 Deficiency of other specified B group vitamins: Secondary | ICD-10-CM | POA: Diagnosis not present

## 2020-06-23 DIAGNOSIS — R531 Weakness: Secondary | ICD-10-CM | POA: Diagnosis not present

## 2020-06-24 DIAGNOSIS — J449 Chronic obstructive pulmonary disease, unspecified: Secondary | ICD-10-CM | POA: Diagnosis not present

## 2020-06-24 DIAGNOSIS — G40909 Epilepsy, unspecified, not intractable, without status epilepticus: Secondary | ICD-10-CM | POA: Diagnosis not present

## 2020-06-24 DIAGNOSIS — F419 Anxiety disorder, unspecified: Secondary | ICD-10-CM | POA: Diagnosis not present

## 2020-06-24 DIAGNOSIS — I2510 Atherosclerotic heart disease of native coronary artery without angina pectoris: Secondary | ICD-10-CM | POA: Diagnosis not present

## 2020-06-24 DIAGNOSIS — I10 Essential (primary) hypertension: Secondary | ICD-10-CM | POA: Diagnosis not present

## 2020-06-24 DIAGNOSIS — K759 Inflammatory liver disease, unspecified: Secondary | ICD-10-CM | POA: Diagnosis not present

## 2020-07-01 DIAGNOSIS — I10 Essential (primary) hypertension: Secondary | ICD-10-CM | POA: Diagnosis not present

## 2020-07-01 DIAGNOSIS — J449 Chronic obstructive pulmonary disease, unspecified: Secondary | ICD-10-CM | POA: Diagnosis not present

## 2020-07-01 DIAGNOSIS — K759 Inflammatory liver disease, unspecified: Secondary | ICD-10-CM | POA: Diagnosis not present

## 2020-07-01 DIAGNOSIS — G40909 Epilepsy, unspecified, not intractable, without status epilepticus: Secondary | ICD-10-CM | POA: Diagnosis not present

## 2020-07-01 DIAGNOSIS — F419 Anxiety disorder, unspecified: Secondary | ICD-10-CM | POA: Diagnosis not present

## 2020-07-01 DIAGNOSIS — I2510 Atherosclerotic heart disease of native coronary artery without angina pectoris: Secondary | ICD-10-CM | POA: Diagnosis not present

## 2020-07-08 DIAGNOSIS — G8929 Other chronic pain: Secondary | ICD-10-CM | POA: Diagnosis not present

## 2020-07-08 DIAGNOSIS — M1990 Unspecified osteoarthritis, unspecified site: Secondary | ICD-10-CM | POA: Diagnosis not present

## 2020-07-08 DIAGNOSIS — J449 Chronic obstructive pulmonary disease, unspecified: Secondary | ICD-10-CM | POA: Diagnosis not present

## 2020-07-08 DIAGNOSIS — I2510 Atherosclerotic heart disease of native coronary artery without angina pectoris: Secondary | ICD-10-CM | POA: Diagnosis not present

## 2020-07-08 DIAGNOSIS — M5441 Lumbago with sciatica, right side: Secondary | ICD-10-CM | POA: Diagnosis not present

## 2020-07-08 DIAGNOSIS — E559 Vitamin D deficiency, unspecified: Secondary | ICD-10-CM | POA: Diagnosis not present

## 2020-07-08 DIAGNOSIS — Z6832 Body mass index (BMI) 32.0-32.9, adult: Secondary | ICD-10-CM | POA: Diagnosis not present

## 2020-07-08 DIAGNOSIS — F419 Anxiety disorder, unspecified: Secondary | ICD-10-CM | POA: Diagnosis not present

## 2020-07-08 DIAGNOSIS — I10 Essential (primary) hypertension: Secondary | ICD-10-CM | POA: Diagnosis not present

## 2020-07-08 DIAGNOSIS — E785 Hyperlipidemia, unspecified: Secondary | ICD-10-CM | POA: Diagnosis not present

## 2020-07-08 DIAGNOSIS — F321 Major depressive disorder, single episode, moderate: Secondary | ICD-10-CM | POA: Diagnosis not present

## 2020-07-08 DIAGNOSIS — M5442 Lumbago with sciatica, left side: Secondary | ICD-10-CM | POA: Diagnosis not present

## 2020-07-08 DIAGNOSIS — G40909 Epilepsy, unspecified, not intractable, without status epilepticus: Secondary | ICD-10-CM | POA: Diagnosis not present

## 2020-07-08 DIAGNOSIS — K759 Inflammatory liver disease, unspecified: Secondary | ICD-10-CM | POA: Diagnosis not present

## 2020-07-08 DIAGNOSIS — D539 Nutritional anemia, unspecified: Secondary | ICD-10-CM | POA: Diagnosis not present

## 2020-07-08 DIAGNOSIS — R739 Hyperglycemia, unspecified: Secondary | ICD-10-CM | POA: Diagnosis not present

## 2020-07-13 DIAGNOSIS — E785 Hyperlipidemia, unspecified: Secondary | ICD-10-CM | POA: Diagnosis not present

## 2020-07-13 DIAGNOSIS — K759 Inflammatory liver disease, unspecified: Secondary | ICD-10-CM | POA: Diagnosis not present

## 2020-07-13 DIAGNOSIS — M150 Primary generalized (osteo)arthritis: Secondary | ICD-10-CM | POA: Diagnosis not present

## 2020-07-13 DIAGNOSIS — N289 Disorder of kidney and ureter, unspecified: Secondary | ICD-10-CM | POA: Diagnosis not present

## 2020-07-13 DIAGNOSIS — F32A Depression, unspecified: Secondary | ICD-10-CM | POA: Diagnosis not present

## 2020-07-13 DIAGNOSIS — Z7951 Long term (current) use of inhaled steroids: Secondary | ICD-10-CM | POA: Diagnosis not present

## 2020-07-13 DIAGNOSIS — Z7982 Long term (current) use of aspirin: Secondary | ICD-10-CM | POA: Diagnosis not present

## 2020-07-13 DIAGNOSIS — M479 Spondylosis, unspecified: Secondary | ICD-10-CM | POA: Diagnosis not present

## 2020-07-13 DIAGNOSIS — F419 Anxiety disorder, unspecified: Secondary | ICD-10-CM | POA: Diagnosis not present

## 2020-07-13 DIAGNOSIS — Z9181 History of falling: Secondary | ICD-10-CM | POA: Diagnosis not present

## 2020-07-13 DIAGNOSIS — Z87440 Personal history of urinary (tract) infections: Secondary | ICD-10-CM | POA: Diagnosis not present

## 2020-07-13 DIAGNOSIS — I2510 Atherosclerotic heart disease of native coronary artery without angina pectoris: Secondary | ICD-10-CM | POA: Diagnosis not present

## 2020-07-13 DIAGNOSIS — J449 Chronic obstructive pulmonary disease, unspecified: Secondary | ICD-10-CM | POA: Diagnosis not present

## 2020-07-13 DIAGNOSIS — G40909 Epilepsy, unspecified, not intractable, without status epilepticus: Secondary | ICD-10-CM | POA: Diagnosis not present

## 2020-07-13 DIAGNOSIS — D649 Anemia, unspecified: Secondary | ICD-10-CM | POA: Diagnosis not present

## 2020-07-13 DIAGNOSIS — Z8673 Personal history of transient ischemic attack (TIA), and cerebral infarction without residual deficits: Secondary | ICD-10-CM | POA: Diagnosis not present

## 2020-07-13 DIAGNOSIS — I10 Essential (primary) hypertension: Secondary | ICD-10-CM | POA: Diagnosis not present

## 2020-07-13 DIAGNOSIS — K219 Gastro-esophageal reflux disease without esophagitis: Secondary | ICD-10-CM | POA: Diagnosis not present

## 2020-07-14 DIAGNOSIS — E785 Hyperlipidemia, unspecified: Secondary | ICD-10-CM | POA: Diagnosis not present

## 2020-07-14 DIAGNOSIS — J449 Chronic obstructive pulmonary disease, unspecified: Secondary | ICD-10-CM | POA: Diagnosis not present

## 2020-07-14 DIAGNOSIS — K219 Gastro-esophageal reflux disease without esophagitis: Secondary | ICD-10-CM | POA: Diagnosis not present

## 2020-07-14 DIAGNOSIS — Z8673 Personal history of transient ischemic attack (TIA), and cerebral infarction without residual deficits: Secondary | ICD-10-CM | POA: Diagnosis not present

## 2020-07-14 DIAGNOSIS — I2510 Atherosclerotic heart disease of native coronary artery without angina pectoris: Secondary | ICD-10-CM | POA: Diagnosis not present

## 2020-07-14 DIAGNOSIS — Z9049 Acquired absence of other specified parts of digestive tract: Secondary | ICD-10-CM | POA: Diagnosis not present

## 2020-07-14 DIAGNOSIS — I10 Essential (primary) hypertension: Secondary | ICD-10-CM | POA: Diagnosis not present

## 2020-07-14 DIAGNOSIS — R748 Abnormal levels of other serum enzymes: Secondary | ICD-10-CM | POA: Diagnosis not present

## 2020-07-14 DIAGNOSIS — Z1159 Encounter for screening for other viral diseases: Secondary | ICD-10-CM | POA: Diagnosis not present

## 2020-07-14 DIAGNOSIS — R945 Abnormal results of liver function studies: Secondary | ICD-10-CM | POA: Diagnosis not present

## 2020-07-16 DIAGNOSIS — H2703 Aphakia, bilateral: Secondary | ICD-10-CM | POA: Diagnosis not present

## 2020-07-16 DIAGNOSIS — H3530 Unspecified macular degeneration: Secondary | ICD-10-CM | POA: Diagnosis not present

## 2020-07-18 DIAGNOSIS — I2510 Atherosclerotic heart disease of native coronary artery without angina pectoris: Secondary | ICD-10-CM | POA: Diagnosis not present

## 2020-07-18 DIAGNOSIS — G40909 Epilepsy, unspecified, not intractable, without status epilepticus: Secondary | ICD-10-CM | POA: Diagnosis not present

## 2020-07-18 DIAGNOSIS — K759 Inflammatory liver disease, unspecified: Secondary | ICD-10-CM | POA: Diagnosis not present

## 2020-07-18 DIAGNOSIS — J449 Chronic obstructive pulmonary disease, unspecified: Secondary | ICD-10-CM | POA: Diagnosis not present

## 2020-07-18 DIAGNOSIS — I10 Essential (primary) hypertension: Secondary | ICD-10-CM | POA: Diagnosis not present

## 2020-07-18 DIAGNOSIS — F419 Anxiety disorder, unspecified: Secondary | ICD-10-CM | POA: Diagnosis not present

## 2020-07-22 DIAGNOSIS — R440 Auditory hallucinations: Secondary | ICD-10-CM | POA: Diagnosis not present

## 2020-07-22 DIAGNOSIS — I10 Essential (primary) hypertension: Secondary | ICD-10-CM | POA: Diagnosis not present

## 2020-07-22 DIAGNOSIS — G40909 Epilepsy, unspecified, not intractable, without status epilepticus: Secondary | ICD-10-CM | POA: Diagnosis not present

## 2020-07-22 DIAGNOSIS — K759 Inflammatory liver disease, unspecified: Secondary | ICD-10-CM | POA: Diagnosis not present

## 2020-07-22 DIAGNOSIS — F419 Anxiety disorder, unspecified: Secondary | ICD-10-CM | POA: Diagnosis not present

## 2020-07-22 DIAGNOSIS — I2510 Atherosclerotic heart disease of native coronary artery without angina pectoris: Secondary | ICD-10-CM | POA: Diagnosis not present

## 2020-07-22 DIAGNOSIS — N390 Urinary tract infection, site not specified: Secondary | ICD-10-CM | POA: Diagnosis not present

## 2020-07-22 DIAGNOSIS — J449 Chronic obstructive pulmonary disease, unspecified: Secondary | ICD-10-CM | POA: Diagnosis not present

## 2020-07-31 DIAGNOSIS — E538 Deficiency of other specified B group vitamins: Secondary | ICD-10-CM | POA: Diagnosis not present

## 2020-07-31 DIAGNOSIS — Z6832 Body mass index (BMI) 32.0-32.9, adult: Secondary | ICD-10-CM | POA: Diagnosis not present

## 2020-07-31 DIAGNOSIS — N390 Urinary tract infection, site not specified: Secondary | ICD-10-CM | POA: Diagnosis not present

## 2020-07-31 DIAGNOSIS — R531 Weakness: Secondary | ICD-10-CM | POA: Diagnosis not present

## 2020-08-08 DIAGNOSIS — K759 Inflammatory liver disease, unspecified: Secondary | ICD-10-CM | POA: Diagnosis not present

## 2020-08-08 DIAGNOSIS — I2510 Atherosclerotic heart disease of native coronary artery without angina pectoris: Secondary | ICD-10-CM | POA: Diagnosis not present

## 2020-08-08 DIAGNOSIS — I10 Essential (primary) hypertension: Secondary | ICD-10-CM | POA: Diagnosis not present

## 2020-08-08 DIAGNOSIS — J449 Chronic obstructive pulmonary disease, unspecified: Secondary | ICD-10-CM | POA: Diagnosis not present

## 2020-08-08 DIAGNOSIS — F419 Anxiety disorder, unspecified: Secondary | ICD-10-CM | POA: Diagnosis not present

## 2020-08-08 DIAGNOSIS — G40909 Epilepsy, unspecified, not intractable, without status epilepticus: Secondary | ICD-10-CM | POA: Diagnosis not present

## 2020-08-11 DIAGNOSIS — T283XXA Burn of internal genitourinary organs, initial encounter: Secondary | ICD-10-CM | POA: Diagnosis not present

## 2020-08-11 DIAGNOSIS — Z79899 Other long term (current) drug therapy: Secondary | ICD-10-CM | POA: Diagnosis not present

## 2020-08-11 DIAGNOSIS — I341 Nonrheumatic mitral (valve) prolapse: Secondary | ICD-10-CM | POA: Diagnosis not present

## 2020-08-11 DIAGNOSIS — N3281 Overactive bladder: Secondary | ICD-10-CM | POA: Diagnosis not present

## 2020-08-11 DIAGNOSIS — X58XXXA Exposure to other specified factors, initial encounter: Secondary | ICD-10-CM | POA: Diagnosis not present

## 2020-08-11 DIAGNOSIS — N3289 Other specified disorders of bladder: Secondary | ICD-10-CM | POA: Diagnosis not present

## 2020-08-11 DIAGNOSIS — R350 Frequency of micturition: Secondary | ICD-10-CM | POA: Diagnosis not present

## 2020-08-11 DIAGNOSIS — N39498 Other specified urinary incontinence: Secondary | ICD-10-CM | POA: Diagnosis not present

## 2020-08-11 DIAGNOSIS — N390 Urinary tract infection, site not specified: Secondary | ICD-10-CM | POA: Diagnosis not present

## 2020-08-11 DIAGNOSIS — R3915 Urgency of urination: Secondary | ICD-10-CM | POA: Diagnosis not present

## 2020-08-12 DIAGNOSIS — I10 Essential (primary) hypertension: Secondary | ICD-10-CM | POA: Diagnosis not present

## 2020-08-12 DIAGNOSIS — F32A Depression, unspecified: Secondary | ICD-10-CM | POA: Diagnosis not present

## 2020-08-12 DIAGNOSIS — Z7982 Long term (current) use of aspirin: Secondary | ICD-10-CM | POA: Diagnosis not present

## 2020-08-12 DIAGNOSIS — G40909 Epilepsy, unspecified, not intractable, without status epilepticus: Secondary | ICD-10-CM | POA: Diagnosis not present

## 2020-08-12 DIAGNOSIS — K219 Gastro-esophageal reflux disease without esophagitis: Secondary | ICD-10-CM | POA: Diagnosis not present

## 2020-08-12 DIAGNOSIS — J449 Chronic obstructive pulmonary disease, unspecified: Secondary | ICD-10-CM | POA: Diagnosis not present

## 2020-08-12 DIAGNOSIS — D649 Anemia, unspecified: Secondary | ICD-10-CM | POA: Diagnosis not present

## 2020-08-12 DIAGNOSIS — Z7951 Long term (current) use of inhaled steroids: Secondary | ICD-10-CM | POA: Diagnosis not present

## 2020-08-12 DIAGNOSIS — N289 Disorder of kidney and ureter, unspecified: Secondary | ICD-10-CM | POA: Diagnosis not present

## 2020-08-12 DIAGNOSIS — Z9181 History of falling: Secondary | ICD-10-CM | POA: Diagnosis not present

## 2020-08-12 DIAGNOSIS — Z87440 Personal history of urinary (tract) infections: Secondary | ICD-10-CM | POA: Diagnosis not present

## 2020-08-12 DIAGNOSIS — Z8673 Personal history of transient ischemic attack (TIA), and cerebral infarction without residual deficits: Secondary | ICD-10-CM | POA: Diagnosis not present

## 2020-08-12 DIAGNOSIS — F419 Anxiety disorder, unspecified: Secondary | ICD-10-CM | POA: Diagnosis not present

## 2020-08-12 DIAGNOSIS — I2510 Atherosclerotic heart disease of native coronary artery without angina pectoris: Secondary | ICD-10-CM | POA: Diagnosis not present

## 2020-08-12 DIAGNOSIS — E785 Hyperlipidemia, unspecified: Secondary | ICD-10-CM | POA: Diagnosis not present

## 2020-08-12 DIAGNOSIS — K759 Inflammatory liver disease, unspecified: Secondary | ICD-10-CM | POA: Diagnosis not present

## 2020-08-12 DIAGNOSIS — M479 Spondylosis, unspecified: Secondary | ICD-10-CM | POA: Diagnosis not present

## 2020-08-12 DIAGNOSIS — M150 Primary generalized (osteo)arthritis: Secondary | ICD-10-CM | POA: Diagnosis not present

## 2020-08-19 DIAGNOSIS — K759 Inflammatory liver disease, unspecified: Secondary | ICD-10-CM | POA: Diagnosis not present

## 2020-08-19 DIAGNOSIS — I2510 Atherosclerotic heart disease of native coronary artery without angina pectoris: Secondary | ICD-10-CM | POA: Diagnosis not present

## 2020-08-19 DIAGNOSIS — F419 Anxiety disorder, unspecified: Secondary | ICD-10-CM | POA: Diagnosis not present

## 2020-08-19 DIAGNOSIS — J449 Chronic obstructive pulmonary disease, unspecified: Secondary | ICD-10-CM | POA: Diagnosis not present

## 2020-08-19 DIAGNOSIS — G40909 Epilepsy, unspecified, not intractable, without status epilepticus: Secondary | ICD-10-CM | POA: Diagnosis not present

## 2020-08-19 DIAGNOSIS — I10 Essential (primary) hypertension: Secondary | ICD-10-CM | POA: Diagnosis not present

## 2020-08-25 DIAGNOSIS — Z6831 Body mass index (BMI) 31.0-31.9, adult: Secondary | ICD-10-CM | POA: Diagnosis not present

## 2020-08-25 DIAGNOSIS — K219 Gastro-esophageal reflux disease without esophagitis: Secondary | ICD-10-CM | POA: Diagnosis not present

## 2020-08-25 DIAGNOSIS — J4530 Mild persistent asthma, uncomplicated: Secondary | ICD-10-CM | POA: Diagnosis not present

## 2020-08-29 DIAGNOSIS — R262 Difficulty in walking, not elsewhere classified: Secondary | ICD-10-CM | POA: Diagnosis not present

## 2020-08-29 DIAGNOSIS — R0781 Pleurodynia: Secondary | ICD-10-CM | POA: Diagnosis not present

## 2020-09-02 DIAGNOSIS — N390 Urinary tract infection, site not specified: Secondary | ICD-10-CM | POA: Diagnosis not present

## 2020-09-02 DIAGNOSIS — N3090 Cystitis, unspecified without hematuria: Secondary | ICD-10-CM | POA: Diagnosis not present

## 2020-09-02 DIAGNOSIS — S2231XA Fracture of one rib, right side, initial encounter for closed fracture: Secondary | ICD-10-CM | POA: Diagnosis not present

## 2020-09-02 DIAGNOSIS — J984 Other disorders of lung: Secondary | ICD-10-CM | POA: Diagnosis not present

## 2020-09-02 DIAGNOSIS — N3289 Other specified disorders of bladder: Secondary | ICD-10-CM | POA: Diagnosis not present

## 2020-09-02 DIAGNOSIS — N281 Cyst of kidney, acquired: Secondary | ICD-10-CM | POA: Diagnosis not present

## 2020-09-02 DIAGNOSIS — K7689 Other specified diseases of liver: Secondary | ICD-10-CM | POA: Diagnosis not present

## 2020-09-02 DIAGNOSIS — Q791 Other congenital malformations of diaphragm: Secondary | ICD-10-CM | POA: Diagnosis not present

## 2020-09-02 DIAGNOSIS — M954 Acquired deformity of chest and rib: Secondary | ICD-10-CM | POA: Diagnosis not present

## 2020-09-02 DIAGNOSIS — R1011 Right upper quadrant pain: Secondary | ICD-10-CM | POA: Diagnosis not present

## 2020-09-03 DIAGNOSIS — N3941 Urge incontinence: Secondary | ICD-10-CM | POA: Diagnosis not present

## 2020-09-03 DIAGNOSIS — R399 Unspecified symptoms and signs involving the genitourinary system: Secondary | ICD-10-CM | POA: Diagnosis not present

## 2020-09-05 DIAGNOSIS — I2510 Atherosclerotic heart disease of native coronary artery without angina pectoris: Secondary | ICD-10-CM | POA: Diagnosis not present

## 2020-09-05 DIAGNOSIS — N390 Urinary tract infection, site not specified: Secondary | ICD-10-CM | POA: Diagnosis not present

## 2020-09-05 DIAGNOSIS — G40909 Epilepsy, unspecified, not intractable, without status epilepticus: Secondary | ICD-10-CM | POA: Diagnosis not present

## 2020-09-05 DIAGNOSIS — N3281 Overactive bladder: Secondary | ICD-10-CM | POA: Diagnosis not present

## 2020-09-05 DIAGNOSIS — J449 Chronic obstructive pulmonary disease, unspecified: Secondary | ICD-10-CM | POA: Diagnosis not present

## 2020-09-05 DIAGNOSIS — I10 Essential (primary) hypertension: Secondary | ICD-10-CM | POA: Diagnosis not present

## 2020-09-05 DIAGNOSIS — F419 Anxiety disorder, unspecified: Secondary | ICD-10-CM | POA: Diagnosis not present

## 2020-09-05 DIAGNOSIS — K759 Inflammatory liver disease, unspecified: Secondary | ICD-10-CM | POA: Diagnosis not present

## 2020-09-09 DIAGNOSIS — I2510 Atherosclerotic heart disease of native coronary artery without angina pectoris: Secondary | ICD-10-CM | POA: Diagnosis not present

## 2020-09-09 DIAGNOSIS — G40909 Epilepsy, unspecified, not intractable, without status epilepticus: Secondary | ICD-10-CM | POA: Diagnosis not present

## 2020-09-09 DIAGNOSIS — I10 Essential (primary) hypertension: Secondary | ICD-10-CM | POA: Diagnosis not present

## 2020-09-09 DIAGNOSIS — F419 Anxiety disorder, unspecified: Secondary | ICD-10-CM | POA: Diagnosis not present

## 2020-09-09 DIAGNOSIS — J449 Chronic obstructive pulmonary disease, unspecified: Secondary | ICD-10-CM | POA: Diagnosis not present

## 2020-09-09 DIAGNOSIS — K759 Inflammatory liver disease, unspecified: Secondary | ICD-10-CM | POA: Diagnosis not present

## 2020-09-11 DIAGNOSIS — K219 Gastro-esophageal reflux disease without esophagitis: Secondary | ICD-10-CM | POA: Diagnosis not present

## 2020-09-11 DIAGNOSIS — I2510 Atherosclerotic heart disease of native coronary artery without angina pectoris: Secondary | ICD-10-CM | POA: Diagnosis not present

## 2020-09-11 DIAGNOSIS — F32A Depression, unspecified: Secondary | ICD-10-CM | POA: Diagnosis not present

## 2020-09-11 DIAGNOSIS — D649 Anemia, unspecified: Secondary | ICD-10-CM | POA: Diagnosis not present

## 2020-09-11 DIAGNOSIS — N289 Disorder of kidney and ureter, unspecified: Secondary | ICD-10-CM | POA: Diagnosis not present

## 2020-09-11 DIAGNOSIS — Z7951 Long term (current) use of inhaled steroids: Secondary | ICD-10-CM | POA: Diagnosis not present

## 2020-09-11 DIAGNOSIS — R5381 Other malaise: Secondary | ICD-10-CM | POA: Diagnosis not present

## 2020-09-11 DIAGNOSIS — K759 Inflammatory liver disease, unspecified: Secondary | ICD-10-CM | POA: Diagnosis not present

## 2020-09-11 DIAGNOSIS — E785 Hyperlipidemia, unspecified: Secondary | ICD-10-CM | POA: Diagnosis not present

## 2020-09-11 DIAGNOSIS — M479 Spondylosis, unspecified: Secondary | ICD-10-CM | POA: Diagnosis not present

## 2020-09-11 DIAGNOSIS — J449 Chronic obstructive pulmonary disease, unspecified: Secondary | ICD-10-CM | POA: Diagnosis not present

## 2020-09-11 DIAGNOSIS — I10 Essential (primary) hypertension: Secondary | ICD-10-CM | POA: Diagnosis not present

## 2020-09-11 DIAGNOSIS — Z9181 History of falling: Secondary | ICD-10-CM | POA: Diagnosis not present

## 2020-09-11 DIAGNOSIS — N390 Urinary tract infection, site not specified: Secondary | ICD-10-CM | POA: Diagnosis not present

## 2020-09-11 DIAGNOSIS — G40909 Epilepsy, unspecified, not intractable, without status epilepticus: Secondary | ICD-10-CM | POA: Diagnosis not present

## 2020-09-11 DIAGNOSIS — Z7982 Long term (current) use of aspirin: Secondary | ICD-10-CM | POA: Diagnosis not present

## 2020-09-11 DIAGNOSIS — M150 Primary generalized (osteo)arthritis: Secondary | ICD-10-CM | POA: Diagnosis not present

## 2020-09-11 DIAGNOSIS — F419 Anxiety disorder, unspecified: Secondary | ICD-10-CM | POA: Diagnosis not present

## 2020-09-11 DIAGNOSIS — Z8673 Personal history of transient ischemic attack (TIA), and cerebral infarction without residual deficits: Secondary | ICD-10-CM | POA: Diagnosis not present

## 2020-09-15 DIAGNOSIS — R5381 Other malaise: Secondary | ICD-10-CM | POA: Diagnosis not present

## 2020-09-15 DIAGNOSIS — I2510 Atherosclerotic heart disease of native coronary artery without angina pectoris: Secondary | ICD-10-CM | POA: Diagnosis not present

## 2020-09-15 DIAGNOSIS — I10 Essential (primary) hypertension: Secondary | ICD-10-CM | POA: Diagnosis not present

## 2020-09-15 DIAGNOSIS — G40909 Epilepsy, unspecified, not intractable, without status epilepticus: Secondary | ICD-10-CM | POA: Diagnosis not present

## 2020-09-15 DIAGNOSIS — J449 Chronic obstructive pulmonary disease, unspecified: Secondary | ICD-10-CM | POA: Diagnosis not present

## 2020-09-15 DIAGNOSIS — N390 Urinary tract infection, site not specified: Secondary | ICD-10-CM | POA: Diagnosis not present

## 2020-09-16 DIAGNOSIS — G40909 Epilepsy, unspecified, not intractable, without status epilepticus: Secondary | ICD-10-CM | POA: Diagnosis not present

## 2020-09-16 DIAGNOSIS — I2510 Atherosclerotic heart disease of native coronary artery without angina pectoris: Secondary | ICD-10-CM | POA: Diagnosis not present

## 2020-09-16 DIAGNOSIS — N390 Urinary tract infection, site not specified: Secondary | ICD-10-CM | POA: Diagnosis not present

## 2020-09-16 DIAGNOSIS — R262 Difficulty in walking, not elsewhere classified: Secondary | ICD-10-CM | POA: Diagnosis not present

## 2020-09-16 DIAGNOSIS — J449 Chronic obstructive pulmonary disease, unspecified: Secondary | ICD-10-CM | POA: Diagnosis not present

## 2020-09-16 DIAGNOSIS — R5381 Other malaise: Secondary | ICD-10-CM | POA: Diagnosis not present

## 2020-09-16 DIAGNOSIS — I10 Essential (primary) hypertension: Secondary | ICD-10-CM | POA: Diagnosis not present

## 2020-09-18 DIAGNOSIS — I2510 Atherosclerotic heart disease of native coronary artery without angina pectoris: Secondary | ICD-10-CM | POA: Diagnosis not present

## 2020-09-18 DIAGNOSIS — J449 Chronic obstructive pulmonary disease, unspecified: Secondary | ICD-10-CM | POA: Diagnosis not present

## 2020-09-18 DIAGNOSIS — R5381 Other malaise: Secondary | ICD-10-CM | POA: Diagnosis not present

## 2020-09-18 DIAGNOSIS — N390 Urinary tract infection, site not specified: Secondary | ICD-10-CM | POA: Diagnosis not present

## 2020-09-18 DIAGNOSIS — G40909 Epilepsy, unspecified, not intractable, without status epilepticus: Secondary | ICD-10-CM | POA: Diagnosis not present

## 2020-09-18 DIAGNOSIS — I10 Essential (primary) hypertension: Secondary | ICD-10-CM | POA: Diagnosis not present

## 2020-09-24 DIAGNOSIS — I10 Essential (primary) hypertension: Secondary | ICD-10-CM | POA: Diagnosis not present

## 2020-09-24 DIAGNOSIS — G40909 Epilepsy, unspecified, not intractable, without status epilepticus: Secondary | ICD-10-CM | POA: Diagnosis not present

## 2020-09-24 DIAGNOSIS — I2510 Atherosclerotic heart disease of native coronary artery without angina pectoris: Secondary | ICD-10-CM | POA: Diagnosis not present

## 2020-09-24 DIAGNOSIS — N390 Urinary tract infection, site not specified: Secondary | ICD-10-CM | POA: Diagnosis not present

## 2020-09-24 DIAGNOSIS — R5381 Other malaise: Secondary | ICD-10-CM | POA: Diagnosis not present

## 2020-09-24 DIAGNOSIS — J449 Chronic obstructive pulmonary disease, unspecified: Secondary | ICD-10-CM | POA: Diagnosis not present

## 2020-09-25 DIAGNOSIS — N390 Urinary tract infection, site not specified: Secondary | ICD-10-CM | POA: Diagnosis not present

## 2020-09-25 DIAGNOSIS — G40909 Epilepsy, unspecified, not intractable, without status epilepticus: Secondary | ICD-10-CM | POA: Diagnosis not present

## 2020-09-25 DIAGNOSIS — I341 Nonrheumatic mitral (valve) prolapse: Secondary | ICD-10-CM | POA: Diagnosis not present

## 2020-09-25 DIAGNOSIS — I2510 Atherosclerotic heart disease of native coronary artery without angina pectoris: Secondary | ICD-10-CM | POA: Diagnosis not present

## 2020-09-25 DIAGNOSIS — R5381 Other malaise: Secondary | ICD-10-CM | POA: Diagnosis not present

## 2020-09-25 DIAGNOSIS — J449 Chronic obstructive pulmonary disease, unspecified: Secondary | ICD-10-CM | POA: Diagnosis not present

## 2020-09-25 DIAGNOSIS — I10 Essential (primary) hypertension: Secondary | ICD-10-CM | POA: Diagnosis not present

## 2020-09-29 DIAGNOSIS — I10 Essential (primary) hypertension: Secondary | ICD-10-CM | POA: Diagnosis not present

## 2020-09-29 DIAGNOSIS — N390 Urinary tract infection, site not specified: Secondary | ICD-10-CM | POA: Diagnosis not present

## 2020-09-29 DIAGNOSIS — G40909 Epilepsy, unspecified, not intractable, without status epilepticus: Secondary | ICD-10-CM | POA: Diagnosis not present

## 2020-09-29 DIAGNOSIS — I2510 Atherosclerotic heart disease of native coronary artery without angina pectoris: Secondary | ICD-10-CM | POA: Diagnosis not present

## 2020-09-29 DIAGNOSIS — R5381 Other malaise: Secondary | ICD-10-CM | POA: Diagnosis not present

## 2020-09-29 DIAGNOSIS — J449 Chronic obstructive pulmonary disease, unspecified: Secondary | ICD-10-CM | POA: Diagnosis not present

## 2020-10-07 DIAGNOSIS — I10 Essential (primary) hypertension: Secondary | ICD-10-CM | POA: Diagnosis not present

## 2020-10-07 DIAGNOSIS — R5381 Other malaise: Secondary | ICD-10-CM | POA: Diagnosis not present

## 2020-10-07 DIAGNOSIS — N390 Urinary tract infection, site not specified: Secondary | ICD-10-CM | POA: Diagnosis not present

## 2020-10-07 DIAGNOSIS — G40909 Epilepsy, unspecified, not intractable, without status epilepticus: Secondary | ICD-10-CM | POA: Diagnosis not present

## 2020-10-07 DIAGNOSIS — J449 Chronic obstructive pulmonary disease, unspecified: Secondary | ICD-10-CM | POA: Diagnosis not present

## 2020-10-07 DIAGNOSIS — I2510 Atherosclerotic heart disease of native coronary artery without angina pectoris: Secondary | ICD-10-CM | POA: Diagnosis not present

## 2020-10-09 DIAGNOSIS — I2510 Atherosclerotic heart disease of native coronary artery without angina pectoris: Secondary | ICD-10-CM | POA: Diagnosis not present

## 2020-10-09 DIAGNOSIS — R5381 Other malaise: Secondary | ICD-10-CM | POA: Diagnosis not present

## 2020-10-09 DIAGNOSIS — I10 Essential (primary) hypertension: Secondary | ICD-10-CM | POA: Diagnosis not present

## 2020-10-09 DIAGNOSIS — J449 Chronic obstructive pulmonary disease, unspecified: Secondary | ICD-10-CM | POA: Diagnosis not present

## 2020-10-09 DIAGNOSIS — G40909 Epilepsy, unspecified, not intractable, without status epilepticus: Secondary | ICD-10-CM | POA: Diagnosis not present

## 2020-10-09 DIAGNOSIS — N390 Urinary tract infection, site not specified: Secondary | ICD-10-CM | POA: Diagnosis not present

## 2020-10-10 DIAGNOSIS — R300 Dysuria: Secondary | ICD-10-CM | POA: Diagnosis not present

## 2020-10-10 DIAGNOSIS — N390 Urinary tract infection, site not specified: Secondary | ICD-10-CM | POA: Diagnosis not present

## 2020-10-11 DIAGNOSIS — N390 Urinary tract infection, site not specified: Secondary | ICD-10-CM | POA: Diagnosis not present

## 2020-10-11 DIAGNOSIS — G40909 Epilepsy, unspecified, not intractable, without status epilepticus: Secondary | ICD-10-CM | POA: Diagnosis not present

## 2020-10-11 DIAGNOSIS — Z7982 Long term (current) use of aspirin: Secondary | ICD-10-CM | POA: Diagnosis not present

## 2020-10-11 DIAGNOSIS — E785 Hyperlipidemia, unspecified: Secondary | ICD-10-CM | POA: Diagnosis not present

## 2020-10-11 DIAGNOSIS — K219 Gastro-esophageal reflux disease without esophagitis: Secondary | ICD-10-CM | POA: Diagnosis not present

## 2020-10-11 DIAGNOSIS — D649 Anemia, unspecified: Secondary | ICD-10-CM | POA: Diagnosis not present

## 2020-10-11 DIAGNOSIS — F419 Anxiety disorder, unspecified: Secondary | ICD-10-CM | POA: Diagnosis not present

## 2020-10-11 DIAGNOSIS — Z7951 Long term (current) use of inhaled steroids: Secondary | ICD-10-CM | POA: Diagnosis not present

## 2020-10-11 DIAGNOSIS — M150 Primary generalized (osteo)arthritis: Secondary | ICD-10-CM | POA: Diagnosis not present

## 2020-10-11 DIAGNOSIS — M479 Spondylosis, unspecified: Secondary | ICD-10-CM | POA: Diagnosis not present

## 2020-10-11 DIAGNOSIS — N289 Disorder of kidney and ureter, unspecified: Secondary | ICD-10-CM | POA: Diagnosis not present

## 2020-10-11 DIAGNOSIS — Z9181 History of falling: Secondary | ICD-10-CM | POA: Diagnosis not present

## 2020-10-11 DIAGNOSIS — Z8673 Personal history of transient ischemic attack (TIA), and cerebral infarction without residual deficits: Secondary | ICD-10-CM | POA: Diagnosis not present

## 2020-10-11 DIAGNOSIS — J449 Chronic obstructive pulmonary disease, unspecified: Secondary | ICD-10-CM | POA: Diagnosis not present

## 2020-10-11 DIAGNOSIS — F32A Depression, unspecified: Secondary | ICD-10-CM | POA: Diagnosis not present

## 2020-10-11 DIAGNOSIS — R5381 Other malaise: Secondary | ICD-10-CM | POA: Diagnosis not present

## 2020-10-11 DIAGNOSIS — I2510 Atherosclerotic heart disease of native coronary artery without angina pectoris: Secondary | ICD-10-CM | POA: Diagnosis not present

## 2020-10-11 DIAGNOSIS — K759 Inflammatory liver disease, unspecified: Secondary | ICD-10-CM | POA: Diagnosis not present

## 2020-10-11 DIAGNOSIS — I10 Essential (primary) hypertension: Secondary | ICD-10-CM | POA: Diagnosis not present

## 2020-10-17 DIAGNOSIS — N390 Urinary tract infection, site not specified: Secondary | ICD-10-CM | POA: Diagnosis not present

## 2020-10-17 DIAGNOSIS — N3281 Overactive bladder: Secondary | ICD-10-CM | POA: Diagnosis not present

## 2020-10-22 DIAGNOSIS — N390 Urinary tract infection, site not specified: Secondary | ICD-10-CM | POA: Diagnosis not present

## 2020-10-22 DIAGNOSIS — J449 Chronic obstructive pulmonary disease, unspecified: Secondary | ICD-10-CM | POA: Diagnosis not present

## 2020-10-22 DIAGNOSIS — R5381 Other malaise: Secondary | ICD-10-CM | POA: Diagnosis not present

## 2020-10-22 DIAGNOSIS — I10 Essential (primary) hypertension: Secondary | ICD-10-CM | POA: Diagnosis not present

## 2020-10-22 DIAGNOSIS — I2510 Atherosclerotic heart disease of native coronary artery without angina pectoris: Secondary | ICD-10-CM | POA: Diagnosis not present

## 2020-10-22 DIAGNOSIS — G40909 Epilepsy, unspecified, not intractable, without status epilepticus: Secondary | ICD-10-CM | POA: Diagnosis not present

## 2020-10-31 DIAGNOSIS — U071 COVID-19: Secondary | ICD-10-CM | POA: Diagnosis not present

## 2020-10-31 DIAGNOSIS — Z20822 Contact with and (suspected) exposure to covid-19: Secondary | ICD-10-CM | POA: Diagnosis not present

## 2020-11-13 DIAGNOSIS — I10 Essential (primary) hypertension: Secondary | ICD-10-CM | POA: Diagnosis not present

## 2020-11-13 DIAGNOSIS — M1990 Unspecified osteoarthritis, unspecified site: Secondary | ICD-10-CM | POA: Diagnosis not present

## 2020-11-13 DIAGNOSIS — M5441 Lumbago with sciatica, right side: Secondary | ICD-10-CM | POA: Diagnosis not present

## 2020-11-13 DIAGNOSIS — F321 Major depressive disorder, single episode, moderate: Secondary | ICD-10-CM | POA: Diagnosis not present

## 2020-11-13 DIAGNOSIS — F419 Anxiety disorder, unspecified: Secondary | ICD-10-CM | POA: Diagnosis not present

## 2020-11-13 DIAGNOSIS — E785 Hyperlipidemia, unspecified: Secondary | ICD-10-CM | POA: Diagnosis not present

## 2020-11-13 DIAGNOSIS — M5442 Lumbago with sciatica, left side: Secondary | ICD-10-CM | POA: Diagnosis not present

## 2020-11-13 DIAGNOSIS — Z6832 Body mass index (BMI) 32.0-32.9, adult: Secondary | ICD-10-CM | POA: Diagnosis not present

## 2020-11-13 DIAGNOSIS — R739 Hyperglycemia, unspecified: Secondary | ICD-10-CM | POA: Diagnosis not present

## 2020-11-13 DIAGNOSIS — G8929 Other chronic pain: Secondary | ICD-10-CM | POA: Diagnosis not present

## 2020-11-13 DIAGNOSIS — E559 Vitamin D deficiency, unspecified: Secondary | ICD-10-CM | POA: Diagnosis not present

## 2020-11-13 DIAGNOSIS — D539 Nutritional anemia, unspecified: Secondary | ICD-10-CM | POA: Diagnosis not present

## 2020-12-05 DIAGNOSIS — Z23 Encounter for immunization: Secondary | ICD-10-CM | POA: Diagnosis not present

## 2020-12-06 DIAGNOSIS — Z23 Encounter for immunization: Secondary | ICD-10-CM | POA: Diagnosis not present

## 2020-12-06 DIAGNOSIS — S81811A Laceration without foreign body, right lower leg, initial encounter: Secondary | ICD-10-CM | POA: Diagnosis not present

## 2020-12-08 DIAGNOSIS — M79604 Pain in right leg: Secondary | ICD-10-CM | POA: Diagnosis not present

## 2020-12-08 DIAGNOSIS — Z6832 Body mass index (BMI) 32.0-32.9, adult: Secondary | ICD-10-CM | POA: Diagnosis not present

## 2020-12-08 DIAGNOSIS — S81811A Laceration without foreign body, right lower leg, initial encounter: Secondary | ICD-10-CM | POA: Diagnosis not present

## 2020-12-12 DIAGNOSIS — I2510 Atherosclerotic heart disease of native coronary artery without angina pectoris: Secondary | ICD-10-CM | POA: Diagnosis not present

## 2020-12-12 DIAGNOSIS — K7689 Other specified diseases of liver: Secondary | ICD-10-CM | POA: Diagnosis not present

## 2020-12-12 DIAGNOSIS — J984 Other disorders of lung: Secondary | ICD-10-CM | POA: Diagnosis not present

## 2020-12-12 DIAGNOSIS — S22079A Unspecified fracture of T9-T10 vertebra, initial encounter for closed fracture: Secondary | ICD-10-CM | POA: Diagnosis not present

## 2020-12-12 DIAGNOSIS — J449 Chronic obstructive pulmonary disease, unspecified: Secondary | ICD-10-CM | POA: Diagnosis not present

## 2020-12-12 DIAGNOSIS — S81811D Laceration without foreign body, right lower leg, subsequent encounter: Secondary | ICD-10-CM | POA: Diagnosis not present

## 2020-12-12 DIAGNOSIS — S3991XA Unspecified injury of abdomen, initial encounter: Secondary | ICD-10-CM | POA: Diagnosis not present

## 2020-12-12 DIAGNOSIS — S2241XA Multiple fractures of ribs, right side, initial encounter for closed fracture: Secondary | ICD-10-CM | POA: Diagnosis not present

## 2020-12-12 DIAGNOSIS — R0781 Pleurodynia: Secondary | ICD-10-CM | POA: Diagnosis not present

## 2020-12-12 DIAGNOSIS — R1011 Right upper quadrant pain: Secondary | ICD-10-CM | POA: Diagnosis not present

## 2020-12-12 DIAGNOSIS — S2231XA Fracture of one rib, right side, initial encounter for closed fracture: Secondary | ICD-10-CM | POA: Diagnosis not present

## 2020-12-12 DIAGNOSIS — Z043 Encounter for examination and observation following other accident: Secondary | ICD-10-CM | POA: Diagnosis not present

## 2020-12-12 DIAGNOSIS — N281 Cyst of kidney, acquired: Secondary | ICD-10-CM | POA: Diagnosis not present

## 2020-12-18 DIAGNOSIS — M79604 Pain in right leg: Secondary | ICD-10-CM | POA: Diagnosis not present

## 2020-12-18 DIAGNOSIS — S81811D Laceration without foreign body, right lower leg, subsequent encounter: Secondary | ICD-10-CM | POA: Diagnosis not present

## 2020-12-18 DIAGNOSIS — S2231XB Fracture of one rib, right side, initial encounter for open fracture: Secondary | ICD-10-CM | POA: Diagnosis not present

## 2020-12-18 DIAGNOSIS — R1011 Right upper quadrant pain: Secondary | ICD-10-CM | POA: Diagnosis not present

## 2020-12-29 DIAGNOSIS — Z139 Encounter for screening, unspecified: Secondary | ICD-10-CM | POA: Diagnosis not present

## 2020-12-29 DIAGNOSIS — S81811D Laceration without foreign body, right lower leg, subsequent encounter: Secondary | ICD-10-CM | POA: Diagnosis not present

## 2020-12-29 DIAGNOSIS — M79604 Pain in right leg: Secondary | ICD-10-CM | POA: Diagnosis not present

## 2020-12-30 DIAGNOSIS — S81801A Unspecified open wound, right lower leg, initial encounter: Secondary | ICD-10-CM | POA: Diagnosis not present

## 2021-01-06 DIAGNOSIS — S81801A Unspecified open wound, right lower leg, initial encounter: Secondary | ICD-10-CM | POA: Diagnosis not present

## 2021-01-08 DIAGNOSIS — N3281 Overactive bladder: Secondary | ICD-10-CM | POA: Diagnosis not present

## 2021-01-08 DIAGNOSIS — N390 Urinary tract infection, site not specified: Secondary | ICD-10-CM | POA: Diagnosis not present

## 2021-01-08 DIAGNOSIS — N3941 Urge incontinence: Secondary | ICD-10-CM | POA: Diagnosis not present

## 2021-02-03 DIAGNOSIS — J309 Allergic rhinitis, unspecified: Secondary | ICD-10-CM | POA: Diagnosis not present

## 2021-02-03 DIAGNOSIS — S81801A Unspecified open wound, right lower leg, initial encounter: Secondary | ICD-10-CM | POA: Diagnosis not present

## 2021-02-03 DIAGNOSIS — J069 Acute upper respiratory infection, unspecified: Secondary | ICD-10-CM | POA: Diagnosis not present

## 2021-02-17 DIAGNOSIS — M25462 Effusion, left knee: Secondary | ICD-10-CM | POA: Diagnosis not present

## 2021-02-17 DIAGNOSIS — M170 Bilateral primary osteoarthritis of knee: Secondary | ICD-10-CM | POA: Diagnosis not present

## 2021-02-17 DIAGNOSIS — M1712 Unilateral primary osteoarthritis, left knee: Secondary | ICD-10-CM | POA: Diagnosis not present

## 2021-02-17 DIAGNOSIS — G8929 Other chronic pain: Secondary | ICD-10-CM | POA: Diagnosis not present

## 2021-02-17 HISTORY — DX: Bilateral primary osteoarthritis of knee: M17.0

## 2021-03-06 DIAGNOSIS — S81801A Unspecified open wound, right lower leg, initial encounter: Secondary | ICD-10-CM | POA: Diagnosis not present

## 2021-03-17 DIAGNOSIS — K219 Gastro-esophageal reflux disease without esophagitis: Secondary | ICD-10-CM | POA: Diagnosis not present

## 2021-03-17 DIAGNOSIS — M5442 Lumbago with sciatica, left side: Secondary | ICD-10-CM | POA: Diagnosis not present

## 2021-03-17 DIAGNOSIS — D539 Nutritional anemia, unspecified: Secondary | ICD-10-CM | POA: Diagnosis not present

## 2021-03-17 DIAGNOSIS — I10 Essential (primary) hypertension: Secondary | ICD-10-CM | POA: Diagnosis not present

## 2021-03-17 DIAGNOSIS — E559 Vitamin D deficiency, unspecified: Secondary | ICD-10-CM | POA: Diagnosis not present

## 2021-03-17 DIAGNOSIS — E785 Hyperlipidemia, unspecified: Secondary | ICD-10-CM | POA: Diagnosis not present

## 2021-03-17 DIAGNOSIS — F419 Anxiety disorder, unspecified: Secondary | ICD-10-CM | POA: Diagnosis not present

## 2021-03-17 DIAGNOSIS — M5441 Lumbago with sciatica, right side: Secondary | ICD-10-CM | POA: Diagnosis not present

## 2021-03-17 DIAGNOSIS — G8929 Other chronic pain: Secondary | ICD-10-CM | POA: Diagnosis not present

## 2021-03-17 DIAGNOSIS — R739 Hyperglycemia, unspecified: Secondary | ICD-10-CM | POA: Diagnosis not present

## 2021-03-17 DIAGNOSIS — F321 Major depressive disorder, single episode, moderate: Secondary | ICD-10-CM | POA: Diagnosis not present

## 2021-03-17 DIAGNOSIS — M1990 Unspecified osteoarthritis, unspecified site: Secondary | ICD-10-CM | POA: Diagnosis not present

## 2021-03-31 DIAGNOSIS — J069 Acute upper respiratory infection, unspecified: Secondary | ICD-10-CM | POA: Diagnosis not present

## 2021-03-31 DIAGNOSIS — R0781 Pleurodynia: Secondary | ICD-10-CM | POA: Diagnosis not present

## 2021-03-31 DIAGNOSIS — F419 Anxiety disorder, unspecified: Secondary | ICD-10-CM | POA: Diagnosis not present

## 2021-04-22 DIAGNOSIS — E538 Deficiency of other specified B group vitamins: Secondary | ICD-10-CM | POA: Diagnosis not present

## 2021-04-29 DIAGNOSIS — E538 Deficiency of other specified B group vitamins: Secondary | ICD-10-CM | POA: Diagnosis not present

## 2021-04-30 DIAGNOSIS — J4540 Moderate persistent asthma, uncomplicated: Secondary | ICD-10-CM | POA: Diagnosis not present

## 2021-05-06 DIAGNOSIS — E538 Deficiency of other specified B group vitamins: Secondary | ICD-10-CM | POA: Diagnosis not present

## 2021-05-06 DIAGNOSIS — S81801A Unspecified open wound, right lower leg, initial encounter: Secondary | ICD-10-CM | POA: Diagnosis not present

## 2021-05-08 DIAGNOSIS — N390 Urinary tract infection, site not specified: Secondary | ICD-10-CM | POA: Diagnosis not present

## 2021-05-08 DIAGNOSIS — N3281 Overactive bladder: Secondary | ICD-10-CM | POA: Diagnosis not present

## 2021-05-08 DIAGNOSIS — R8290 Unspecified abnormal findings in urine: Secondary | ICD-10-CM | POA: Diagnosis not present

## 2021-05-13 DIAGNOSIS — E538 Deficiency of other specified B group vitamins: Secondary | ICD-10-CM | POA: Diagnosis not present

## 2021-05-20 DIAGNOSIS — S81801A Unspecified open wound, right lower leg, initial encounter: Secondary | ICD-10-CM | POA: Diagnosis not present

## 2021-05-22 DIAGNOSIS — K219 Gastro-esophageal reflux disease without esophagitis: Secondary | ICD-10-CM | POA: Diagnosis not present

## 2021-05-22 DIAGNOSIS — R1310 Dysphagia, unspecified: Secondary | ICD-10-CM | POA: Diagnosis not present

## 2021-05-22 DIAGNOSIS — R059 Cough, unspecified: Secondary | ICD-10-CM | POA: Diagnosis not present

## 2021-06-09 DIAGNOSIS — Z981 Arthrodesis status: Secondary | ICD-10-CM | POA: Diagnosis not present

## 2021-06-09 DIAGNOSIS — R1312 Dysphagia, oropharyngeal phase: Secondary | ICD-10-CM | POA: Diagnosis not present

## 2021-06-09 DIAGNOSIS — K219 Gastro-esophageal reflux disease without esophagitis: Secondary | ICD-10-CM | POA: Diagnosis not present

## 2021-06-09 DIAGNOSIS — R198 Other specified symptoms and signs involving the digestive system and abdomen: Secondary | ICD-10-CM | POA: Diagnosis not present

## 2021-06-09 DIAGNOSIS — Z9889 Other specified postprocedural states: Secondary | ICD-10-CM | POA: Diagnosis not present

## 2021-06-09 DIAGNOSIS — R1310 Dysphagia, unspecified: Secondary | ICD-10-CM | POA: Diagnosis not present

## 2021-06-16 DIAGNOSIS — E538 Deficiency of other specified B group vitamins: Secondary | ICD-10-CM | POA: Diagnosis not present

## 2021-06-16 DIAGNOSIS — Z1231 Encounter for screening mammogram for malignant neoplasm of breast: Secondary | ICD-10-CM | POA: Diagnosis not present

## 2021-06-24 ENCOUNTER — Telehealth: Payer: Self-pay | Admitting: Gastroenterology

## 2021-06-24 NOTE — Telephone Encounter (Signed)
Hi Dr. Lyndel Safe,  This patient has seen you in the past but has more recently been seen by [facility].  (The records are in [place] for your review.) She is experiencing difficulty swallowing and they are unable to see her until September.  She would like to transfer her care back to you.  Please let me know if you approve this transfer.  Thank you.

## 2021-06-29 DIAGNOSIS — R278 Other lack of coordination: Secondary | ICD-10-CM | POA: Diagnosis not present

## 2021-06-29 DIAGNOSIS — N3946 Mixed incontinence: Secondary | ICD-10-CM | POA: Diagnosis not present

## 2021-06-29 DIAGNOSIS — M6281 Muscle weakness (generalized): Secondary | ICD-10-CM | POA: Diagnosis not present

## 2021-07-02 NOTE — Telephone Encounter (Signed)
Extensive work-up at [facility] reviewed In fact, she is in better hands at [facility].  Should continue her care there RG

## 2021-07-09 DIAGNOSIS — M25561 Pain in right knee: Secondary | ICD-10-CM | POA: Diagnosis not present

## 2021-07-09 DIAGNOSIS — M1990 Unspecified osteoarthritis, unspecified site: Secondary | ICD-10-CM | POA: Diagnosis not present

## 2021-07-16 DIAGNOSIS — M5441 Lumbago with sciatica, right side: Secondary | ICD-10-CM | POA: Diagnosis not present

## 2021-07-16 DIAGNOSIS — R928 Other abnormal and inconclusive findings on diagnostic imaging of breast: Secondary | ICD-10-CM | POA: Diagnosis not present

## 2021-07-16 DIAGNOSIS — R739 Hyperglycemia, unspecified: Secondary | ICD-10-CM | POA: Diagnosis not present

## 2021-07-16 DIAGNOSIS — F419 Anxiety disorder, unspecified: Secondary | ICD-10-CM | POA: Diagnosis not present

## 2021-07-16 DIAGNOSIS — E538 Deficiency of other specified B group vitamins: Secondary | ICD-10-CM | POA: Diagnosis not present

## 2021-07-16 DIAGNOSIS — D539 Nutritional anemia, unspecified: Secondary | ICD-10-CM | POA: Diagnosis not present

## 2021-07-16 DIAGNOSIS — M1990 Unspecified osteoarthritis, unspecified site: Secondary | ICD-10-CM | POA: Diagnosis not present

## 2021-07-16 DIAGNOSIS — F321 Major depressive disorder, single episode, moderate: Secondary | ICD-10-CM | POA: Diagnosis not present

## 2021-07-16 DIAGNOSIS — I10 Essential (primary) hypertension: Secondary | ICD-10-CM | POA: Diagnosis not present

## 2021-07-16 DIAGNOSIS — E559 Vitamin D deficiency, unspecified: Secondary | ICD-10-CM | POA: Diagnosis not present

## 2021-07-16 DIAGNOSIS — M5442 Lumbago with sciatica, left side: Secondary | ICD-10-CM | POA: Diagnosis not present

## 2021-07-16 DIAGNOSIS — Z9181 History of falling: Secondary | ICD-10-CM | POA: Diagnosis not present

## 2021-07-16 DIAGNOSIS — N6489 Other specified disorders of breast: Secondary | ICD-10-CM | POA: Diagnosis not present

## 2021-07-16 DIAGNOSIS — E785 Hyperlipidemia, unspecified: Secondary | ICD-10-CM | POA: Diagnosis not present

## 2021-07-17 NOTE — Telephone Encounter (Signed)
Please set her up in the app clinic RG

## 2021-07-20 NOTE — Telephone Encounter (Signed)
Pt was contacted and notified that we could get her an appointment: Pt stated that she is feeling better and that she will keep her appointment that she already has at [facility]. Pt verbalized understanding with all questions answered.

## 2021-07-23 DIAGNOSIS — N6322 Unspecified lump in the left breast, upper inner quadrant: Secondary | ICD-10-CM | POA: Diagnosis not present

## 2021-07-23 DIAGNOSIS — R928 Other abnormal and inconclusive findings on diagnostic imaging of breast: Secondary | ICD-10-CM | POA: Diagnosis not present

## 2021-07-23 DIAGNOSIS — N6325 Unspecified lump in the left breast, overlapping quadrants: Secondary | ICD-10-CM | POA: Diagnosis not present

## 2021-07-23 DIAGNOSIS — C50212 Malignant neoplasm of upper-inner quadrant of left female breast: Secondary | ICD-10-CM | POA: Diagnosis not present

## 2021-07-23 DIAGNOSIS — C50812 Malignant neoplasm of overlapping sites of left female breast: Secondary | ICD-10-CM | POA: Diagnosis not present

## 2021-07-29 NOTE — Progress Notes (Signed)
Initial phone contact with newly diagnosed pt. Pt is requesting Dr. Lilia Pro since she has seen him previously. Appt scheduled for 08/11/2021. Encourgaged pt to pick up copy of "The Breast Cancer Treatment Handbook" at the front desk of the [facility] and to call at any time with questions or concerns.

## 2021-07-30 DIAGNOSIS — M25561 Pain in right knee: Secondary | ICD-10-CM | POA: Diagnosis not present

## 2021-07-30 DIAGNOSIS — G8929 Other chronic pain: Secondary | ICD-10-CM | POA: Diagnosis not present

## 2021-07-30 DIAGNOSIS — M170 Bilateral primary osteoarthritis of knee: Secondary | ICD-10-CM | POA: Diagnosis not present

## 2021-07-30 DIAGNOSIS — M25562 Pain in left knee: Secondary | ICD-10-CM | POA: Diagnosis not present

## 2021-08-05 DIAGNOSIS — K219 Gastro-esophageal reflux disease without esophagitis: Secondary | ICD-10-CM | POA: Diagnosis not present

## 2021-08-05 DIAGNOSIS — R198 Other specified symptoms and signs involving the digestive system and abdomen: Secondary | ICD-10-CM | POA: Diagnosis not present

## 2021-08-05 DIAGNOSIS — R1310 Dysphagia, unspecified: Secondary | ICD-10-CM | POA: Diagnosis not present

## 2021-08-05 DIAGNOSIS — R6339 Other feeding difficulties: Secondary | ICD-10-CM | POA: Diagnosis not present

## 2021-08-05 DIAGNOSIS — R1312 Dysphagia, oropharyngeal phase: Secondary | ICD-10-CM | POA: Diagnosis not present

## 2021-08-05 DIAGNOSIS — Z9889 Other specified postprocedural states: Secondary | ICD-10-CM | POA: Diagnosis not present

## 2021-08-10 DIAGNOSIS — Z170 Estrogen receptor positive status [ER+]: Secondary | ICD-10-CM | POA: Diagnosis not present

## 2021-08-10 DIAGNOSIS — C50212 Malignant neoplasm of upper-inner quadrant of left female breast: Secondary | ICD-10-CM | POA: Diagnosis not present

## 2021-08-10 DIAGNOSIS — N390 Urinary tract infection, site not specified: Secondary | ICD-10-CM | POA: Diagnosis not present

## 2021-08-10 DIAGNOSIS — I10 Essential (primary) hypertension: Secondary | ICD-10-CM | POA: Diagnosis not present

## 2021-08-11 DIAGNOSIS — C50212 Malignant neoplasm of upper-inner quadrant of left female breast: Secondary | ICD-10-CM | POA: Diagnosis not present

## 2021-08-11 DIAGNOSIS — Z170 Estrogen receptor positive status [ER+]: Secondary | ICD-10-CM | POA: Diagnosis not present

## 2021-08-12 ENCOUNTER — Other Ambulatory Visit: Payer: Self-pay | Admitting: Surgery

## 2021-08-12 DIAGNOSIS — Z170 Estrogen receptor positive status [ER+]: Secondary | ICD-10-CM

## 2021-08-12 DIAGNOSIS — C50212 Malignant neoplasm of upper-inner quadrant of left female breast: Secondary | ICD-10-CM

## 2021-08-17 ENCOUNTER — Ambulatory Visit
Admission: RE | Admit: 2021-08-17 | Discharge: 2021-08-17 | Disposition: A | Discharge status: Home / Self Care | Payer: Medicare Other | Source: Ambulatory Visit | Attending: Surgery | Admitting: Surgery

## 2021-08-17 DIAGNOSIS — Z170 Estrogen receptor positive status [ER+]: Secondary | ICD-10-CM

## 2021-08-17 DIAGNOSIS — D0502 Lobular carcinoma in situ of left breast: Secondary | ICD-10-CM | POA: Diagnosis not present

## 2021-08-17 MED ORDER — GADOBUTROL 1 MMOL/ML IV SOLN
8.0000 mL | Freq: Once | INTRAVENOUS | Status: AC | PRN
  Administered 2021-08-17: 8 mL via INTRAVENOUS

## 2021-08-18 ENCOUNTER — Other Ambulatory Visit: Payer: Self-pay | Admitting: Surgery

## 2021-08-18 DIAGNOSIS — R9389 Abnormal findings on diagnostic imaging of other specified body structures: Secondary | ICD-10-CM

## 2021-08-19 NOTE — Progress Notes (Signed)
Phone contact with pt. Pt reports that she has had a breast MRI and is now scheduled for a second breast biopsy which is delaying her surgery date. Discussed with the patient that even though  it is hard to wait, that this is the best option for her so that when she has surgery they can know that they are getting all the cancer and hopefully it will prevent another surgery in the near future.

## 2021-08-28 ENCOUNTER — Other Ambulatory Visit (HOSPITAL_COMMUNITY): Payer: Self-pay | Admitting: Diagnostic Radiology

## 2021-08-28 ENCOUNTER — Ambulatory Visit
Admission: RE | Admit: 2021-08-28 | Discharge: 2021-08-28 | Disposition: A | Discharge status: Home / Self Care | Payer: Medicare Other | Source: Ambulatory Visit | Attending: Surgery | Admitting: Surgery

## 2021-08-28 DIAGNOSIS — R928 Other abnormal and inconclusive findings on diagnostic imaging of breast: Secondary | ICD-10-CM | POA: Diagnosis not present

## 2021-08-28 DIAGNOSIS — R9389 Abnormal findings on diagnostic imaging of other specified body structures: Secondary | ICD-10-CM

## 2021-08-28 DIAGNOSIS — D0512 Intraductal carcinoma in situ of left breast: Secondary | ICD-10-CM | POA: Diagnosis not present

## 2021-08-28 MED ORDER — GADOBUTROL 1 MMOL/ML IV SOLN
8.0000 mL | Freq: Once | INTRAVENOUS | Status: AC | PRN
  Administered 2021-08-28: 8 mL via INTRAVENOUS

## 2021-09-03 DIAGNOSIS — C50212 Malignant neoplasm of upper-inner quadrant of left female breast: Secondary | ICD-10-CM | POA: Diagnosis not present

## 2021-09-03 DIAGNOSIS — S80922S Unspecified superficial injury of left lower leg, sequela: Secondary | ICD-10-CM | POA: Diagnosis not present

## 2021-09-03 DIAGNOSIS — N390 Urinary tract infection, site not specified: Secondary | ICD-10-CM | POA: Diagnosis not present

## 2021-09-03 DIAGNOSIS — E538 Deficiency of other specified B group vitamins: Secondary | ICD-10-CM | POA: Diagnosis not present

## 2021-09-03 DIAGNOSIS — Z170 Estrogen receptor positive status [ER+]: Secondary | ICD-10-CM | POA: Diagnosis not present

## 2021-09-07 DIAGNOSIS — Z7951 Long term (current) use of inhaled steroids: Secondary | ICD-10-CM | POA: Diagnosis not present

## 2021-09-07 DIAGNOSIS — R928 Other abnormal and inconclusive findings on diagnostic imaging of breast: Secondary | ICD-10-CM | POA: Diagnosis not present

## 2021-09-07 DIAGNOSIS — C50312 Malignant neoplasm of lower-inner quadrant of left female breast: Secondary | ICD-10-CM | POA: Diagnosis not present

## 2021-09-07 DIAGNOSIS — J45909 Unspecified asthma, uncomplicated: Secondary | ICD-10-CM | POA: Diagnosis not present

## 2021-09-07 DIAGNOSIS — C773 Secondary and unspecified malignant neoplasm of axilla and upper limb lymph nodes: Secondary | ICD-10-CM | POA: Diagnosis not present

## 2021-09-07 DIAGNOSIS — I10 Essential (primary) hypertension: Secondary | ICD-10-CM | POA: Diagnosis not present

## 2021-09-07 DIAGNOSIS — C50212 Malignant neoplasm of upper-inner quadrant of left female breast: Secondary | ICD-10-CM | POA: Diagnosis not present

## 2021-09-07 DIAGNOSIS — Z170 Estrogen receptor positive status [ER+]: Secondary | ICD-10-CM | POA: Diagnosis not present

## 2021-09-07 DIAGNOSIS — J449 Chronic obstructive pulmonary disease, unspecified: Secondary | ICD-10-CM | POA: Diagnosis not present

## 2021-09-17 NOTE — Progress Notes (Signed)
Phone contact with breast cancer pt. Pt had surgery on 09/07/2021 and is awaiting her appt with Dr Bobby Rumpf on 09/30/2021.

## 2021-09-29 ENCOUNTER — Other Ambulatory Visit: Payer: Medicare Other

## 2021-09-29 ENCOUNTER — Ambulatory Visit: Payer: Medicare Other | Admitting: Oncology

## 2021-09-29 DIAGNOSIS — K219 Gastro-esophageal reflux disease without esophagitis: Secondary | ICD-10-CM | POA: Diagnosis not present

## 2021-09-29 DIAGNOSIS — R1310 Dysphagia, unspecified: Secondary | ICD-10-CM | POA: Diagnosis not present

## 2021-09-29 DIAGNOSIS — I639 Cerebral infarction, unspecified: Secondary | ICD-10-CM | POA: Diagnosis not present

## 2021-09-29 DIAGNOSIS — Q399 Congenital malformation of esophagus, unspecified: Secondary | ICD-10-CM | POA: Diagnosis not present

## 2021-09-29 DIAGNOSIS — M549 Dorsalgia, unspecified: Secondary | ICD-10-CM | POA: Diagnosis not present

## 2021-09-29 DIAGNOSIS — Z8673 Personal history of transient ischemic attack (TIA), and cerebral infarction without residual deficits: Secondary | ICD-10-CM | POA: Diagnosis not present

## 2021-09-29 DIAGNOSIS — M170 Bilateral primary osteoarthritis of knee: Secondary | ICD-10-CM | POA: Diagnosis not present

## 2021-09-29 DIAGNOSIS — R1312 Dysphagia, oropharyngeal phase: Secondary | ICD-10-CM | POA: Diagnosis not present

## 2021-09-29 DIAGNOSIS — R933 Abnormal findings on diagnostic imaging of other parts of digestive tract: Secondary | ICD-10-CM | POA: Diagnosis not present

## 2021-09-29 DIAGNOSIS — J449 Chronic obstructive pulmonary disease, unspecified: Secondary | ICD-10-CM | POA: Diagnosis not present

## 2021-09-29 DIAGNOSIS — G8929 Other chronic pain: Secondary | ICD-10-CM | POA: Diagnosis not present

## 2021-09-29 DIAGNOSIS — E669 Obesity, unspecified: Secondary | ICD-10-CM | POA: Diagnosis not present

## 2021-09-29 DIAGNOSIS — Z853 Personal history of malignant neoplasm of breast: Secondary | ICD-10-CM | POA: Diagnosis not present

## 2021-09-29 DIAGNOSIS — C50912 Malignant neoplasm of unspecified site of left female breast: Secondary | ICD-10-CM | POA: Diagnosis not present

## 2021-09-29 DIAGNOSIS — M1990 Unspecified osteoarthritis, unspecified site: Secondary | ICD-10-CM | POA: Diagnosis not present

## 2021-09-29 DIAGNOSIS — F419 Anxiety disorder, unspecified: Secondary | ICD-10-CM | POA: Diagnosis not present

## 2021-09-29 DIAGNOSIS — I10 Essential (primary) hypertension: Secondary | ICD-10-CM | POA: Diagnosis not present

## 2021-09-29 NOTE — Progress Notes (Signed)
[facility]  [address] [place],  [place]  [address] [phone]  Clinic Day:  09/30/2021  Referring physician: [name], NP  HISTORY OF PRESENT ILLNESS:  The patient is an 81 y.o. female who I was asked to consult upon for newly diagnosed breast cancer.   Her history dates back to a screening mammogram in May 2023, which showed an abnormality in her left breast, which was further confirmed by a diagnostic mammogram in June 2023.  This study showed 2 masses measuring 7 and 8 mm, respectively.  A breast MRI done afterwards actually showed a third lesion measuring 5 mm there was in close proximity to the 2 aforementioned lesions.  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  The lesion seen on the mammogram was biopsy-proven to be invasive carcinoma.  Eventually, a left breast lumpectomy was done in August 2023, which revealed a grade I, 2.2 cm invasive lobular carcinoma that is estrogen and progesterone receptor positive, but Her2/Neu receptor negative.   Her Ki-67 score was <5%.  One sentinel lymph node was removed, which was positive for nodal involvement.  Of note, all 3 lesions were removed with this lumpectomy, with all margins being clean.  The patient comes in today to go over all of her breast cancer pathology and its implications.  Of note, her mother succumbed to ovarian cancer.  She also has a nephew who passed away from pancreatic cancer.  PAST MEDICAL HISTORY:   Past Medical History:  Diagnosis Date   Acid reflux    Anxiety    Arthritis    Asthma    Breast cancer ([place])    Cataracts, bilateral    COPD (chronic obstructive pulmonary disease) ([facility])    Depression    H/O bladder problems    Hypertension    IBS (irritable bowel syndrome)    Osteoporosis    Pneumonia     PAST SURGICAL HISTORY:   Past Surgical History:  Procedure Laterality Date   ABDOMINAL HYSTERECTOMY  1977   APPENDECTOMY     BACK  SURGERY     x4 22 screws 2 rods   CATARACT EXTRACTION Bilateral    COLONOSCOPY  04/11/2014   Mild diverticulosis. Otherwise normal colonoscopy. Highly redundant colon.    GALLBLADDER SURGERY  02/26/2017   HERNIA REPAIR  02/26/2017   TWO ABDOMINAL HERNIA'S AND HIATAL HERNIA REPAIR   KNEE ARTHROSCOPY Left    POLYPECTOMY     TONSILLECTOMY AND ADENOIDECTOMY     TUBAL LIGATION      CURRENT MEDICATIONS:   Current Outpatient Medications  Medication Sig Dispense Refill   acidophilus (RISAQUAD) CAPS capsule Take by mouth daily.     albuterol (PROVENTIL) (2.5 MG/3ML) 0.083% nebulizer solution Take 2.5 mg by nebulization every 6 (six) hours as needed.     albuterol (VENTOLIN HFA) 108 (90 Base) MCG/ACT inhaler Inhale into the lungs every 6 (six) hours as needed.     famotidine (PEPCID) 20 MG tablet Take 20 mg by mouth 2 (two) times daily.     methenamine (HIPREX) 1 g tablet Take 1 g by mouth 2 (two) times daily with a meal.     acetaminophen (TYLENOL) 325 MG tablet Take 325 mg by mouth every 6 (six) hours as needed for moderate pain.      aspirin EC 81 MG tablet Take 81 mg by mouth daily.      celecoxib (CELEBREX) 100 MG capsule Take 100 mg  by mouth 2 (two) times daily.     cholecalciferol (VITAMIN D3) 25 MCG (1000 UNIT) tablet Take 1,000 Units by mouth daily.     clonazePAM (KLONOPIN) 0.5 MG tablet Take 1 mg by mouth 2 (two) times daily.      conjugated estrogens (PREMARIN) vaginal cream Place 1 Applicatorful vaginally See admin instructions. 3 times a week     DULoxetine (CYMBALTA) 30 MG capsule Take 30 mg by mouth daily.     EPINEPHrine (EPIPEN 2-PAK) 0.3 mg/0.3 mL IJ SOAJ injection Inject 0.3 mg into the muscle as needed for anaphylaxis.      esomeprazole (NEXIUM) 40 MG capsule Take 40 mg by mouth 2 (two) times daily.     ezetimibe (ZETIA) 10 MG tablet Take 10 mg by mouth daily.     gabapentin (NEURONTIN) 300 MG capsule Take 600 mg by mouth 2 (two) times daily as needed (sometimes 3 times  daily).     letrozole (FEMARA) 2.5 MG tablet Take 1 tablet (2.5 mg total) by mouth daily. 90 tablet 3   montelukast (SINGULAIR) 10 MG tablet Take 10 mg by mouth daily.      nitroGLYCERIN (NITROSTAT) 0.4 MG SL tablet Place 0.4 mg under the tongue every 5 (five) minutes as needed for chest pain.      oxybutynin (DITROPAN-XL) 5 MG 24 hr tablet Take 5 mg by mouth daily.     polyethylene glycol (MIRALAX / GLYCOLAX) packet Take 17 g by mouth daily as needed for mild constipation.      potassium chloride SA (K-DUR,KLOR-CON) 20 MEQ tablet Take 20 mEq by mouth daily.      traMADol (ULTRAM) 50 MG tablet Take 50 mg by mouth every 12 (twelve) hours as needed for moderate pain.      No current facility-administered medications for this visit.    ALLERGIES:   Allergies  Allergen Reactions   Caffeine Shortness Of Breath   Haloperidol Shortness Of Breath   Food     Allergy to seafood   Nicotine Other (See Comments)    asthma   Tape     Said it as a disappearing tape but not sure    FAMILY HISTORY:   Family History  Problem Relation Age of Onset   Stomach cancer Mother    Alzheimer's disease Sister    Alzheimer's disease Sister    Colon cancer Neg Hx    Esophageal cancer Neg Hx     SOCIAL HISTORY:  The patient was born and raised in [place].  She lives in town with her husband of 27 years.  She has 2 children and 3 grandchildren.  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  There is no history of alcoholism or tobacco abuse.  REVIEW OF SYSTEMS:  Review of Systems  Constitutional:  Negative for fatigue and fever.  HENT:   Negative for hearing loss and sore throat.   Eyes:  Negative for eye problems.  Respiratory:  Negative for chest tightness, cough and hemoptysis.   Cardiovascular:  Negative for chest pain and palpitations.  Gastrointestinal:  Negative for abdominal distention, abdominal pain, blood in stool, constipation, diarrhea, nausea  and vomiting.  Endocrine: Negative for hot flashes.  Genitourinary:  Positive for frequency. Negative for difficulty urinating, dysuria, hematuria and nocturia.   Musculoskeletal:  Positive for arthralgias and gait problem. Negative for back pain and myalgias.  Skin: Negative.  Negative for itching and rash.  Friable   Neurological:  Positive for gait problem. Negative for dizziness, extremity weakness, headaches, light-headedness and numbness.  Hematological: Negative.   Psychiatric/Behavioral:  Positive for depression. Negative for suicidal ideas. The patient is not nervous/anxious.    PHYSICAL EXAM:  Blood pressure (!) 193/84, pulse 85, temperature 98.4 F (36.9 C), resp. rate 14, height '5\' 3"'  (1.6 m), weight 167 lb 3.2 oz (75.8 kg), SpO2 94 %. Wt Readings from Last 3 Encounters:  09/30/21 167 lb 3.2 oz (75.8 kg)  09/21/19 178 lb 5.6 oz (80.9 kg)  10/10/18 182 lb (82.6 kg)   Body mass index is 29.62 kg/m. Performance status (ECOG): 1 - Symptomatic but completely ambulatory Physical Exam Constitutional:      Appearance: Normal appearance.  HENT:     Mouth/Throat:     Pharynx: Oropharynx is clear. No oropharyngeal exudate.  Cardiovascular:     Rate and Rhythm: Normal rate and regular rhythm.     Heart sounds: No murmur heard.    No friction rub. No gallop.  Pulmonary:     Breath sounds: Normal breath sounds.  Chest:  Breasts:    Right: No swelling, bleeding, inverted nipple, mass, nipple discharge or skin change.     Left: No swelling, bleeding, inverted nipple, mass, nipple discharge or skin change.  Abdominal:     General: Bowel sounds are normal. There is no distension.     Palpations: Abdomen is soft. There is no mass.     Tenderness: There is no abdominal tenderness.  Musculoskeletal:        General: No tenderness.     Cervical back: Normal range of motion and neck supple.     Right lower leg: No edema.     Left lower leg: No edema.  Lymphadenopathy:      Cervical: No cervical adenopathy.     Right cervical: No superficial, deep or posterior cervical adenopathy.    Left cervical: No superficial, deep or posterior cervical adenopathy.     Upper Body:     Right upper body: No supraclavicular or axillary adenopathy.     Left upper body: No supraclavicular or axillary adenopathy.     Lower Body: No right inguinal adenopathy. No left inguinal adenopathy.  Skin:    Coloration: Skin is not jaundiced.     Findings: No lesion or rash.  Neurological:     General: No focal deficit present.     Mental Status: She is alert and oriented to person, place, and time. Mental status is at baseline.  Psychiatric:        Mood and Affect: Mood normal.        Behavior: Behavior normal.        Thought Content: Thought content normal.        Judgment: Judgment normal.    ASSESSMENT & PLAN:  An 81 y.o. 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  Overall, outside of her left axillary lymph node that was positive for disease involvement, she has no other pathologic features that appear particularly ominous.  Based upon this, I do not believe there is any role for adjuvant chemotherapy or radiation.  I will place her on letrozole 2.5 mg daily, which she will take daily for a total of 5 years.  Moving forward, I will follow her with clinical breast exams every 4 months for the first few years.  She also needs to continue undergoing yearly mammograms for her continued radiographic breast cancer surveillance.  Clinically, the patient appears to be doing well.  I will see her back in 4 months for repeat clinical assessment.  The patient understands all the plans discussed today and is in agreement with them.  I do appreciate Moon, Amy A, NP for his new consult.   Demontay Grantham Macarthur Critchley, MD

## 2021-09-30 ENCOUNTER — Other Ambulatory Visit: Payer: Self-pay | Admitting: Oncology

## 2021-09-30 ENCOUNTER — Encounter: Payer: Self-pay | Admitting: Oncology

## 2021-09-30 ENCOUNTER — Inpatient Hospital Stay: Payer: Medicare Other

## 2021-09-30 ENCOUNTER — Inpatient Hospital Stay: Payer: Medicare Other | Attending: Oncology | Admitting: Oncology

## 2021-09-30 DIAGNOSIS — Z170 Estrogen receptor positive status [ER+]: Secondary | ICD-10-CM

## 2021-09-30 DIAGNOSIS — C50412 Malignant neoplasm of upper-outer quadrant of left female breast: Secondary | ICD-10-CM | POA: Diagnosis not present

## 2021-09-30 DIAGNOSIS — C50919 Malignant neoplasm of unspecified site of unspecified female breast: Secondary | ICD-10-CM

## 2021-09-30 HISTORY — DX: Malignant neoplasm of unspecified site of unspecified female breast: C50.919

## 2021-09-30 MED ORDER — LETROZOLE 2.5 MG PO TABS: 2.5000 mg | ORAL_TABLET | Freq: Every day | ORAL | 3 refills | Status: DC

## 2021-09-30 NOTE — Progress Notes (Signed)
Face to face contact with pt in [place]. Pt has completed surgery and is here for a Med Onc consult with Dr. Bobby Rumpf.

## 2021-10-06 DIAGNOSIS — E538 Deficiency of other specified B group vitamins: Secondary | ICD-10-CM | POA: Diagnosis not present

## 2021-10-06 DIAGNOSIS — Z170 Estrogen receptor positive status [ER+]: Secondary | ICD-10-CM | POA: Diagnosis not present

## 2021-10-06 DIAGNOSIS — N390 Urinary tract infection, site not specified: Secondary | ICD-10-CM | POA: Diagnosis not present

## 2021-10-06 DIAGNOSIS — C50212 Malignant neoplasm of upper-inner quadrant of left female breast: Secondary | ICD-10-CM | POA: Diagnosis not present

## 2021-10-08 DIAGNOSIS — N390 Urinary tract infection, site not specified: Secondary | ICD-10-CM | POA: Diagnosis not present

## 2021-10-13 DIAGNOSIS — N3281 Overactive bladder: Secondary | ICD-10-CM | POA: Diagnosis not present

## 2021-10-13 DIAGNOSIS — N390 Urinary tract infection, site not specified: Secondary | ICD-10-CM | POA: Diagnosis not present

## 2021-10-19 DIAGNOSIS — J4530 Mild persistent asthma, uncomplicated: Secondary | ICD-10-CM | POA: Diagnosis not present

## 2021-10-19 DIAGNOSIS — B9689 Other specified bacterial agents as the cause of diseases classified elsewhere: Secondary | ICD-10-CM | POA: Diagnosis not present

## 2021-10-19 DIAGNOSIS — J208 Acute bronchitis due to other specified organisms: Secondary | ICD-10-CM | POA: Diagnosis not present

## 2021-10-28 NOTE — Progress Notes (Signed)
Phone contact with pt. Pt is taking aromatase inhibitors daily and reports no problems.

## 2021-11-02 DIAGNOSIS — H2703 Aphakia, bilateral: Secondary | ICD-10-CM | POA: Diagnosis not present

## 2021-11-05 DIAGNOSIS — E538 Deficiency of other specified B group vitamins: Secondary | ICD-10-CM | POA: Diagnosis not present

## 2021-11-16 DIAGNOSIS — C50919 Malignant neoplasm of unspecified site of unspecified female breast: Secondary | ICD-10-CM | POA: Diagnosis not present

## 2021-11-16 DIAGNOSIS — K224 Dyskinesia of esophagus: Secondary | ICD-10-CM | POA: Diagnosis not present

## 2021-11-16 DIAGNOSIS — K219 Gastro-esophageal reflux disease without esophagitis: Secondary | ICD-10-CM | POA: Diagnosis not present

## 2021-11-16 DIAGNOSIS — K222 Esophageal obstruction: Secondary | ICD-10-CM | POA: Diagnosis not present

## 2021-11-16 DIAGNOSIS — K2950 Unspecified chronic gastritis without bleeding: Secondary | ICD-10-CM | POA: Diagnosis not present

## 2021-11-19 DIAGNOSIS — M5442 Lumbago with sciatica, left side: Secondary | ICD-10-CM | POA: Diagnosis not present

## 2021-11-19 DIAGNOSIS — M1990 Unspecified osteoarthritis, unspecified site: Secondary | ICD-10-CM | POA: Diagnosis not present

## 2021-11-19 DIAGNOSIS — I10 Essential (primary) hypertension: Secondary | ICD-10-CM | POA: Diagnosis not present

## 2021-11-19 DIAGNOSIS — E785 Hyperlipidemia, unspecified: Secondary | ICD-10-CM | POA: Diagnosis not present

## 2021-11-19 DIAGNOSIS — D539 Nutritional anemia, unspecified: Secondary | ICD-10-CM | POA: Diagnosis not present

## 2021-11-19 DIAGNOSIS — R739 Hyperglycemia, unspecified: Secondary | ICD-10-CM | POA: Diagnosis not present

## 2021-11-19 DIAGNOSIS — G8929 Other chronic pain: Secondary | ICD-10-CM | POA: Diagnosis not present

## 2021-11-19 DIAGNOSIS — E559 Vitamin D deficiency, unspecified: Secondary | ICD-10-CM | POA: Diagnosis not present

## 2021-11-19 DIAGNOSIS — M5441 Lumbago with sciatica, right side: Secondary | ICD-10-CM | POA: Diagnosis not present

## 2021-11-19 DIAGNOSIS — N3020 Other chronic cystitis without hematuria: Secondary | ICD-10-CM | POA: Diagnosis not present

## 2021-11-19 DIAGNOSIS — F419 Anxiety disorder, unspecified: Secondary | ICD-10-CM | POA: Diagnosis not present

## 2021-11-19 DIAGNOSIS — Z23 Encounter for immunization: Secondary | ICD-10-CM | POA: Diagnosis not present

## 2021-12-08 DIAGNOSIS — M25562 Pain in left knee: Secondary | ICD-10-CM | POA: Diagnosis not present

## 2021-12-08 DIAGNOSIS — E538 Deficiency of other specified B group vitamins: Secondary | ICD-10-CM | POA: Diagnosis not present

## 2021-12-08 DIAGNOSIS — M25561 Pain in right knee: Secondary | ICD-10-CM | POA: Diagnosis not present

## 2021-12-08 DIAGNOSIS — G8929 Other chronic pain: Secondary | ICD-10-CM | POA: Diagnosis not present

## 2021-12-14 DIAGNOSIS — R748 Abnormal levels of other serum enzymes: Secondary | ICD-10-CM | POA: Diagnosis not present

## 2021-12-18 DIAGNOSIS — R748 Abnormal levels of other serum enzymes: Secondary | ICD-10-CM | POA: Diagnosis not present

## 2021-12-18 DIAGNOSIS — Z170 Estrogen receptor positive status [ER+]: Secondary | ICD-10-CM | POA: Diagnosis not present

## 2021-12-18 DIAGNOSIS — C50212 Malignant neoplasm of upper-inner quadrant of left female breast: Secondary | ICD-10-CM | POA: Diagnosis not present

## 2021-12-21 DIAGNOSIS — N6323 Unspecified lump in the left breast, lower outer quadrant: Secondary | ICD-10-CM | POA: Diagnosis not present

## 2021-12-21 DIAGNOSIS — Z853 Personal history of malignant neoplasm of breast: Secondary | ICD-10-CM | POA: Diagnosis not present

## 2021-12-21 DIAGNOSIS — R92312 Mammographic fatty tissue density, left breast: Secondary | ICD-10-CM | POA: Diagnosis not present

## 2021-12-21 DIAGNOSIS — N644 Mastodynia: Secondary | ICD-10-CM | POA: Diagnosis not present

## 2021-12-21 DIAGNOSIS — N6321 Unspecified lump in the left breast, upper outer quadrant: Secondary | ICD-10-CM | POA: Diagnosis not present

## 2021-12-22 DIAGNOSIS — Z170 Estrogen receptor positive status [ER+]: Secondary | ICD-10-CM | POA: Diagnosis not present

## 2021-12-22 DIAGNOSIS — C50212 Malignant neoplasm of upper-inner quadrant of left female breast: Secondary | ICD-10-CM | POA: Diagnosis not present

## 2021-12-29 DIAGNOSIS — R748 Abnormal levels of other serum enzymes: Secondary | ICD-10-CM | POA: Diagnosis not present

## 2022-01-07 DIAGNOSIS — E538 Deficiency of other specified B group vitamins: Secondary | ICD-10-CM | POA: Diagnosis not present

## 2022-01-07 DIAGNOSIS — N390 Urinary tract infection, site not specified: Secondary | ICD-10-CM | POA: Diagnosis not present

## 2022-01-07 DIAGNOSIS — R399 Unspecified symptoms and signs involving the genitourinary system: Secondary | ICD-10-CM | POA: Diagnosis not present

## 2022-01-07 DIAGNOSIS — S8991XA Unspecified injury of right lower leg, initial encounter: Secondary | ICD-10-CM | POA: Diagnosis not present

## 2022-01-29 ENCOUNTER — Ambulatory Visit: Payer: Medicare Other | Admitting: Oncology

## 2022-02-01 NOTE — Progress Notes (Signed)
[facility]  [address] [place],  [place]  [address] [phone]  Clinic Day:  02/02/2022  Referring physician: [name], NP  HISTORY OF PRESENT ILLNESS:  The patient is an 82 y.o. 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  She is currently taking letrozole for her adjuvant endocrine therapy.  She comes in today for routine follow-up.  Since her last visit, the patient has been doing fairly well.  She does have lateral left breast discomfort, but claims to have fallen recently.  She denies noticing any masses or other findings which concern her for early disease recurrence.  PHYSICAL EXAM:  Blood pressure (!) 175/102, pulse (!) 103, temperature 98.3 F (36.8 C), resp. rate 16, height '5\' 3"'$  (1.6 m), weight 165 lb 11.2 oz (75.2 kg), SpO2 94 %. Wt Readings from Last 3 Encounters:  02/02/22 165 lb 11.2 oz (75.2 kg)  09/30/21 167 lb 3.2 oz (75.8 kg)  09/21/19 178 lb 5.6 oz (80.9 kg)   Body mass index is 29.35 kg/m. Performance status (ECOG): 1 - Symptomatic but completely ambulatory Physical Exam Constitutional:      Appearance: Normal appearance.  HENT:     Mouth/Throat:     Pharynx: Oropharynx is clear. No oropharyngeal exudate.  Cardiovascular:     Rate and Rhythm: Normal rate and regular rhythm.     Heart sounds: No murmur heard.    No friction rub. No gallop.  Pulmonary:     Breath sounds: Normal breath sounds.  Chest:  Breasts:    Right: No swelling, bleeding, inverted nipple, mass, nipple discharge or skin change.     Left: No swelling, bleeding, inverted nipple, mass, nipple discharge or skin change.  Abdominal:     General: Bowel sounds are normal. There is no distension.     Palpations: Abdomen is soft. There is no mass.     Tenderness: There is no abdominal tenderness.  Musculoskeletal:        General: No tenderness.     Cervical back: Normal range of  motion and neck supple.     Right lower leg: No edema.     Left lower leg: No edema.  Lymphadenopathy:     Cervical: No cervical adenopathy.     Right cervical: No superficial, deep or posterior cervical adenopathy.    Left cervical: No superficial, deep or posterior cervical adenopathy.     Upper Body:     Right upper body: No supraclavicular or axillary adenopathy.     Left upper body: No supraclavicular or axillary adenopathy.     Lower Body: No right inguinal adenopathy. No left inguinal adenopathy.  Skin:    Coloration: Skin is not jaundiced.     Findings: No lesion or rash.  Neurological:     General: No focal deficit present.     Mental Status: She is alert and oriented to person, place, and time. Mental status is at baseline.  Psychiatric:        Mood and Affect: Mood normal.        Behavior: Behavior normal.        Thought Content: Thought content normal.        Judgment: Judgment normal.    ASSESSMENT & PLAN:  An 82 y.o. 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  Based upon her  clinical breast exam today, the patient remains disease-free.  She knows to continue taking her letrozole on a daily basis for 5 years of adjuvant endocrine therapy.  With respect to her left breast discomfort, the patient knows to use anti-inflammatories for pain relief.  Otherwise, she is doing well, I will see her back in 4 months for her next clinical breast exam.  The patient understands all the plans discussed today and is in agreement with them.  Makilah Dowda Macarthur Critchley, MD

## 2022-02-02 ENCOUNTER — Inpatient Hospital Stay: Payer: Medicare Other | Attending: Oncology | Admitting: Oncology

## 2022-02-02 VITALS — BP 175/102 | HR 103 | Temp 98.3°F | Resp 16 | Ht 63.0 in | Wt 165.7 lb

## 2022-02-02 DIAGNOSIS — F419 Anxiety disorder, unspecified: Secondary | ICD-10-CM | POA: Diagnosis not present

## 2022-02-02 DIAGNOSIS — I10 Essential (primary) hypertension: Secondary | ICD-10-CM | POA: Diagnosis not present

## 2022-02-02 DIAGNOSIS — Z170 Estrogen receptor positive status [ER+]: Secondary | ICD-10-CM

## 2022-02-02 DIAGNOSIS — C50412 Malignant neoplasm of upper-outer quadrant of left female breast: Secondary | ICD-10-CM | POA: Diagnosis not present

## 2022-02-02 NOTE — Progress Notes (Signed)
Face to face contact with pt and her husband in [place]. Pt continues on aromatase inhibitor without complaints or problems.

## 2022-02-09 DIAGNOSIS — E538 Deficiency of other specified B group vitamins: Secondary | ICD-10-CM | POA: Diagnosis not present

## 2022-02-10 DIAGNOSIS — C50212 Malignant neoplasm of upper-inner quadrant of left female breast: Secondary | ICD-10-CM | POA: Diagnosis not present

## 2022-02-10 DIAGNOSIS — Z170 Estrogen receptor positive status [ER+]: Secondary | ICD-10-CM | POA: Diagnosis not present

## 2022-02-16 DIAGNOSIS — F419 Anxiety disorder, unspecified: Secondary | ICD-10-CM | POA: Diagnosis not present

## 2022-02-16 DIAGNOSIS — F321 Major depressive disorder, single episode, moderate: Secondary | ICD-10-CM | POA: Diagnosis not present

## 2022-03-02 DIAGNOSIS — S50911A Unspecified superficial injury of right forearm, initial encounter: Secondary | ICD-10-CM | POA: Diagnosis not present

## 2022-03-02 DIAGNOSIS — F321 Major depressive disorder, single episode, moderate: Secondary | ICD-10-CM | POA: Diagnosis not present

## 2022-03-11 DIAGNOSIS — M170 Bilateral primary osteoarthritis of knee: Secondary | ICD-10-CM | POA: Diagnosis not present

## 2022-03-12 DIAGNOSIS — R58 Hemorrhage, not elsewhere classified: Secondary | ICD-10-CM | POA: Diagnosis not present

## 2022-03-12 DIAGNOSIS — S0003XA Contusion of scalp, initial encounter: Secondary | ICD-10-CM | POA: Diagnosis not present

## 2022-03-12 DIAGNOSIS — S81812A Laceration without foreign body, left lower leg, initial encounter: Secondary | ICD-10-CM | POA: Diagnosis not present

## 2022-03-12 DIAGNOSIS — R000 Tachycardia, unspecified: Secondary | ICD-10-CM | POA: Diagnosis not present

## 2022-03-12 DIAGNOSIS — I2510 Atherosclerotic heart disease of native coronary artery without angina pectoris: Secondary | ICD-10-CM | POA: Diagnosis not present

## 2022-03-12 DIAGNOSIS — N281 Cyst of kidney, acquired: Secondary | ICD-10-CM | POA: Diagnosis not present

## 2022-03-12 DIAGNOSIS — W19XXXA Unspecified fall, initial encounter: Secondary | ICD-10-CM | POA: Diagnosis not present

## 2022-03-12 DIAGNOSIS — J449 Chronic obstructive pulmonary disease, unspecified: Secondary | ICD-10-CM | POA: Diagnosis not present

## 2022-03-12 DIAGNOSIS — R319 Hematuria, unspecified: Secondary | ICD-10-CM | POA: Diagnosis not present

## 2022-03-12 DIAGNOSIS — N390 Urinary tract infection, site not specified: Secondary | ICD-10-CM | POA: Diagnosis not present

## 2022-03-12 DIAGNOSIS — M47812 Spondylosis without myelopathy or radiculopathy, cervical region: Secondary | ICD-10-CM | POA: Diagnosis not present

## 2022-03-12 DIAGNOSIS — I10 Essential (primary) hypertension: Secondary | ICD-10-CM | POA: Diagnosis not present

## 2022-03-12 DIAGNOSIS — S199XXA Unspecified injury of neck, initial encounter: Secondary | ICD-10-CM | POA: Diagnosis not present

## 2022-03-12 DIAGNOSIS — G319 Degenerative disease of nervous system, unspecified: Secondary | ICD-10-CM | POA: Diagnosis not present

## 2022-03-12 DIAGNOSIS — R079 Chest pain, unspecified: Secondary | ICD-10-CM | POA: Diagnosis not present

## 2022-03-12 DIAGNOSIS — M2578 Osteophyte, vertebrae: Secondary | ICD-10-CM | POA: Diagnosis not present

## 2022-03-12 DIAGNOSIS — S0990XA Unspecified injury of head, initial encounter: Secondary | ICD-10-CM | POA: Diagnosis not present

## 2022-03-16 DIAGNOSIS — E538 Deficiency of other specified B group vitamins: Secondary | ICD-10-CM | POA: Diagnosis not present

## 2022-03-17 ENCOUNTER — Telehealth: Payer: Self-pay

## 2022-03-17 NOTE — Telephone Encounter (Signed)
     Patient  visit on 03/12/2022  at [facility] was for fall.  Have you been able to follow up with your primary care physician? Patient has appointment 03/26/22.  The patient was or was not able to obtain any needed medicine or equipment. Patient was able to obtain medication.  Are there diet recommendations that you are having difficulty following? No  Patient expresses understanding of discharge instructions and education provided has no other needs at this time. Yes   [facility] Guide   [email or web address]  ?? [id number]   Website: [email or web address]  [email or web address]

## 2022-03-22 DIAGNOSIS — D539 Nutritional anemia, unspecified: Secondary | ICD-10-CM | POA: Diagnosis not present

## 2022-03-22 DIAGNOSIS — Z139 Encounter for screening, unspecified: Secondary | ICD-10-CM | POA: Diagnosis not present

## 2022-03-22 DIAGNOSIS — I10 Essential (primary) hypertension: Secondary | ICD-10-CM | POA: Diagnosis not present

## 2022-03-22 DIAGNOSIS — M5441 Lumbago with sciatica, right side: Secondary | ICD-10-CM | POA: Diagnosis not present

## 2022-03-22 DIAGNOSIS — M5442 Lumbago with sciatica, left side: Secondary | ICD-10-CM | POA: Diagnosis not present

## 2022-03-22 DIAGNOSIS — M1990 Unspecified osteoarthritis, unspecified site: Secondary | ICD-10-CM | POA: Diagnosis not present

## 2022-03-22 DIAGNOSIS — G8929 Other chronic pain: Secondary | ICD-10-CM | POA: Diagnosis not present

## 2022-03-22 DIAGNOSIS — F419 Anxiety disorder, unspecified: Secondary | ICD-10-CM | POA: Diagnosis not present

## 2022-03-26 DIAGNOSIS — N390 Urinary tract infection, site not specified: Secondary | ICD-10-CM | POA: Diagnosis not present

## 2022-03-26 DIAGNOSIS — J4530 Mild persistent asthma, uncomplicated: Secondary | ICD-10-CM | POA: Diagnosis not present

## 2022-03-26 DIAGNOSIS — D539 Nutritional anemia, unspecified: Secondary | ICD-10-CM | POA: Diagnosis not present

## 2022-03-26 DIAGNOSIS — Z7951 Long term (current) use of inhaled steroids: Secondary | ICD-10-CM | POA: Diagnosis not present

## 2022-03-26 DIAGNOSIS — Z9181 History of falling: Secondary | ICD-10-CM | POA: Diagnosis not present

## 2022-03-26 DIAGNOSIS — R262 Difficulty in walking, not elsewhere classified: Secondary | ICD-10-CM | POA: Diagnosis not present

## 2022-03-26 DIAGNOSIS — M810 Age-related osteoporosis without current pathological fracture: Secondary | ICD-10-CM | POA: Diagnosis not present

## 2022-03-26 DIAGNOSIS — Z9049 Acquired absence of other specified parts of digestive tract: Secondary | ICD-10-CM | POA: Diagnosis not present

## 2022-03-26 DIAGNOSIS — M5441 Lumbago with sciatica, right side: Secondary | ICD-10-CM | POA: Diagnosis not present

## 2022-03-26 DIAGNOSIS — S0001XD Abrasion of scalp, subsequent encounter: Secondary | ICD-10-CM | POA: Diagnosis not present

## 2022-03-26 DIAGNOSIS — E785 Hyperlipidemia, unspecified: Secondary | ICD-10-CM | POA: Diagnosis not present

## 2022-03-26 DIAGNOSIS — G8929 Other chronic pain: Secondary | ICD-10-CM | POA: Diagnosis not present

## 2022-03-26 DIAGNOSIS — N3281 Overactive bladder: Secondary | ICD-10-CM | POA: Diagnosis not present

## 2022-03-26 DIAGNOSIS — M5442 Lumbago with sciatica, left side: Secondary | ICD-10-CM | POA: Diagnosis not present

## 2022-03-26 DIAGNOSIS — M1990 Unspecified osteoarthritis, unspecified site: Secondary | ICD-10-CM | POA: Diagnosis not present

## 2022-03-26 DIAGNOSIS — Z7982 Long term (current) use of aspirin: Secondary | ICD-10-CM | POA: Diagnosis not present

## 2022-03-26 DIAGNOSIS — F321 Major depressive disorder, single episode, moderate: Secondary | ICD-10-CM | POA: Diagnosis not present

## 2022-03-26 DIAGNOSIS — K219 Gastro-esophageal reflux disease without esophagitis: Secondary | ICD-10-CM | POA: Diagnosis not present

## 2022-03-26 DIAGNOSIS — Z9841 Cataract extraction status, right eye: Secondary | ICD-10-CM | POA: Diagnosis not present

## 2022-03-26 DIAGNOSIS — I10 Essential (primary) hypertension: Secondary | ICD-10-CM | POA: Diagnosis not present

## 2022-03-26 DIAGNOSIS — S81812D Laceration without foreign body, left lower leg, subsequent encounter: Secondary | ICD-10-CM | POA: Diagnosis not present

## 2022-03-26 DIAGNOSIS — G4700 Insomnia, unspecified: Secondary | ICD-10-CM | POA: Diagnosis not present

## 2022-03-26 DIAGNOSIS — Z791 Long term (current) use of non-steroidal anti-inflammatories (NSAID): Secondary | ICD-10-CM | POA: Diagnosis not present

## 2022-03-26 DIAGNOSIS — Z79899 Other long term (current) drug therapy: Secondary | ICD-10-CM | POA: Diagnosis not present

## 2022-03-26 DIAGNOSIS — E559 Vitamin D deficiency, unspecified: Secondary | ICD-10-CM | POA: Diagnosis not present

## 2022-03-29 DIAGNOSIS — N390 Urinary tract infection, site not specified: Secondary | ICD-10-CM | POA: Diagnosis not present

## 2022-03-29 DIAGNOSIS — M5441 Lumbago with sciatica, right side: Secondary | ICD-10-CM | POA: Diagnosis not present

## 2022-03-29 DIAGNOSIS — M5442 Lumbago with sciatica, left side: Secondary | ICD-10-CM | POA: Diagnosis not present

## 2022-03-29 DIAGNOSIS — M810 Age-related osteoporosis without current pathological fracture: Secondary | ICD-10-CM | POA: Diagnosis not present

## 2022-03-29 DIAGNOSIS — R262 Difficulty in walking, not elsewhere classified: Secondary | ICD-10-CM | POA: Diagnosis not present

## 2022-03-29 DIAGNOSIS — G8929 Other chronic pain: Secondary | ICD-10-CM | POA: Diagnosis not present

## 2022-03-30 DIAGNOSIS — M5441 Lumbago with sciatica, right side: Secondary | ICD-10-CM | POA: Diagnosis not present

## 2022-03-30 DIAGNOSIS — N390 Urinary tract infection, site not specified: Secondary | ICD-10-CM | POA: Diagnosis not present

## 2022-03-30 DIAGNOSIS — M810 Age-related osteoporosis without current pathological fracture: Secondary | ICD-10-CM | POA: Diagnosis not present

## 2022-03-30 DIAGNOSIS — R262 Difficulty in walking, not elsewhere classified: Secondary | ICD-10-CM | POA: Diagnosis not present

## 2022-03-30 DIAGNOSIS — M5442 Lumbago with sciatica, left side: Secondary | ICD-10-CM | POA: Diagnosis not present

## 2022-03-30 DIAGNOSIS — G8929 Other chronic pain: Secondary | ICD-10-CM | POA: Diagnosis not present

## 2022-04-02 DIAGNOSIS — M5441 Lumbago with sciatica, right side: Secondary | ICD-10-CM | POA: Diagnosis not present

## 2022-04-02 DIAGNOSIS — G8929 Other chronic pain: Secondary | ICD-10-CM | POA: Diagnosis not present

## 2022-04-02 DIAGNOSIS — R262 Difficulty in walking, not elsewhere classified: Secondary | ICD-10-CM | POA: Diagnosis not present

## 2022-04-02 DIAGNOSIS — M810 Age-related osteoporosis without current pathological fracture: Secondary | ICD-10-CM | POA: Diagnosis not present

## 2022-04-02 DIAGNOSIS — M5442 Lumbago with sciatica, left side: Secondary | ICD-10-CM | POA: Diagnosis not present

## 2022-04-02 DIAGNOSIS — N390 Urinary tract infection, site not specified: Secondary | ICD-10-CM | POA: Diagnosis not present

## 2022-04-05 DIAGNOSIS — I10 Essential (primary) hypertension: Secondary | ICD-10-CM | POA: Diagnosis not present

## 2022-04-05 DIAGNOSIS — M1990 Unspecified osteoarthritis, unspecified site: Secondary | ICD-10-CM | POA: Diagnosis not present

## 2022-04-05 DIAGNOSIS — F321 Major depressive disorder, single episode, moderate: Secondary | ICD-10-CM | POA: Diagnosis not present

## 2022-04-05 DIAGNOSIS — F419 Anxiety disorder, unspecified: Secondary | ICD-10-CM | POA: Diagnosis not present

## 2022-04-05 DIAGNOSIS — M5442 Lumbago with sciatica, left side: Secondary | ICD-10-CM | POA: Diagnosis not present

## 2022-04-06 DIAGNOSIS — N390 Urinary tract infection, site not specified: Secondary | ICD-10-CM | POA: Diagnosis not present

## 2022-04-06 DIAGNOSIS — R262 Difficulty in walking, not elsewhere classified: Secondary | ICD-10-CM | POA: Diagnosis not present

## 2022-04-06 DIAGNOSIS — M810 Age-related osteoporosis without current pathological fracture: Secondary | ICD-10-CM | POA: Diagnosis not present

## 2022-04-06 DIAGNOSIS — G8929 Other chronic pain: Secondary | ICD-10-CM | POA: Diagnosis not present

## 2022-04-06 DIAGNOSIS — M5441 Lumbago with sciatica, right side: Secondary | ICD-10-CM | POA: Diagnosis not present

## 2022-04-06 DIAGNOSIS — M5442 Lumbago with sciatica, left side: Secondary | ICD-10-CM | POA: Diagnosis not present

## 2022-04-09 DIAGNOSIS — M810 Age-related osteoporosis without current pathological fracture: Secondary | ICD-10-CM | POA: Diagnosis not present

## 2022-04-09 DIAGNOSIS — N390 Urinary tract infection, site not specified: Secondary | ICD-10-CM | POA: Diagnosis not present

## 2022-04-09 DIAGNOSIS — G8929 Other chronic pain: Secondary | ICD-10-CM | POA: Diagnosis not present

## 2022-04-09 DIAGNOSIS — M5442 Lumbago with sciatica, left side: Secondary | ICD-10-CM | POA: Diagnosis not present

## 2022-04-09 DIAGNOSIS — M5441 Lumbago with sciatica, right side: Secondary | ICD-10-CM | POA: Diagnosis not present

## 2022-04-09 DIAGNOSIS — R262 Difficulty in walking, not elsewhere classified: Secondary | ICD-10-CM | POA: Diagnosis not present

## 2022-04-12 DIAGNOSIS — E538 Deficiency of other specified B group vitamins: Secondary | ICD-10-CM | POA: Diagnosis not present

## 2022-04-12 DIAGNOSIS — R262 Difficulty in walking, not elsewhere classified: Secondary | ICD-10-CM | POA: Diagnosis not present

## 2022-04-12 DIAGNOSIS — L0231 Cutaneous abscess of buttock: Secondary | ICD-10-CM | POA: Diagnosis not present

## 2022-04-13 DIAGNOSIS — N3281 Overactive bladder: Secondary | ICD-10-CM | POA: Diagnosis not present

## 2022-04-13 DIAGNOSIS — N390 Urinary tract infection, site not specified: Secondary | ICD-10-CM | POA: Diagnosis not present

## 2022-04-15 DIAGNOSIS — M810 Age-related osteoporosis without current pathological fracture: Secondary | ICD-10-CM | POA: Diagnosis not present

## 2022-04-15 DIAGNOSIS — R262 Difficulty in walking, not elsewhere classified: Secondary | ICD-10-CM | POA: Diagnosis not present

## 2022-04-15 DIAGNOSIS — N390 Urinary tract infection, site not specified: Secondary | ICD-10-CM | POA: Diagnosis not present

## 2022-04-15 DIAGNOSIS — G8929 Other chronic pain: Secondary | ICD-10-CM | POA: Diagnosis not present

## 2022-04-15 DIAGNOSIS — M5442 Lumbago with sciatica, left side: Secondary | ICD-10-CM | POA: Diagnosis not present

## 2022-04-15 DIAGNOSIS — M5441 Lumbago with sciatica, right side: Secondary | ICD-10-CM | POA: Diagnosis not present

## 2022-04-20 DIAGNOSIS — K219 Gastro-esophageal reflux disease without esophagitis: Secondary | ICD-10-CM | POA: Diagnosis not present

## 2022-04-20 DIAGNOSIS — N189 Chronic kidney disease, unspecified: Secondary | ICD-10-CM | POA: Diagnosis not present

## 2022-04-20 DIAGNOSIS — R000 Tachycardia, unspecified: Secondary | ICD-10-CM | POA: Diagnosis not present

## 2022-04-20 DIAGNOSIS — J449 Chronic obstructive pulmonary disease, unspecified: Secondary | ICD-10-CM | POA: Diagnosis not present

## 2022-04-20 DIAGNOSIS — I252 Old myocardial infarction: Secondary | ICD-10-CM | POA: Diagnosis not present

## 2022-04-20 DIAGNOSIS — I2510 Atherosclerotic heart disease of native coronary artery without angina pectoris: Secondary | ICD-10-CM | POA: Diagnosis not present

## 2022-04-20 DIAGNOSIS — R9431 Abnormal electrocardiogram [ECG] [EKG]: Secondary | ICD-10-CM | POA: Diagnosis not present

## 2022-04-20 DIAGNOSIS — Z87898 Personal history of other specified conditions: Secondary | ICD-10-CM | POA: Diagnosis not present

## 2022-04-20 DIAGNOSIS — W19XXXA Unspecified fall, initial encounter: Secondary | ICD-10-CM | POA: Diagnosis not present

## 2022-04-20 DIAGNOSIS — R296 Repeated falls: Secondary | ICD-10-CM | POA: Diagnosis not present

## 2022-04-20 DIAGNOSIS — I10 Essential (primary) hypertension: Secondary | ICD-10-CM | POA: Diagnosis not present

## 2022-04-20 DIAGNOSIS — E785 Hyperlipidemia, unspecified: Secondary | ICD-10-CM | POA: Diagnosis not present

## 2022-04-20 DIAGNOSIS — I129 Hypertensive chronic kidney disease with stage 1 through stage 4 chronic kidney disease, or unspecified chronic kidney disease: Secondary | ICD-10-CM | POA: Diagnosis not present

## 2022-04-20 DIAGNOSIS — T1490XA Injury, unspecified, initial encounter: Secondary | ICD-10-CM | POA: Diagnosis not present

## 2022-04-20 DIAGNOSIS — N390 Urinary tract infection, site not specified: Secondary | ICD-10-CM | POA: Diagnosis not present

## 2022-04-21 DIAGNOSIS — M5441 Lumbago with sciatica, right side: Secondary | ICD-10-CM | POA: Diagnosis not present

## 2022-04-21 DIAGNOSIS — M5442 Lumbago with sciatica, left side: Secondary | ICD-10-CM | POA: Diagnosis not present

## 2022-04-21 DIAGNOSIS — R262 Difficulty in walking, not elsewhere classified: Secondary | ICD-10-CM | POA: Diagnosis not present

## 2022-04-21 DIAGNOSIS — G8929 Other chronic pain: Secondary | ICD-10-CM | POA: Diagnosis not present

## 2022-04-21 DIAGNOSIS — M810 Age-related osteoporosis without current pathological fracture: Secondary | ICD-10-CM | POA: Diagnosis not present

## 2022-04-21 DIAGNOSIS — N390 Urinary tract infection, site not specified: Secondary | ICD-10-CM | POA: Diagnosis not present

## 2022-04-23 ENCOUNTER — Telehealth: Payer: Self-pay

## 2022-04-23 DIAGNOSIS — R262 Difficulty in walking, not elsewhere classified: Secondary | ICD-10-CM | POA: Diagnosis not present

## 2022-04-23 DIAGNOSIS — G8929 Other chronic pain: Secondary | ICD-10-CM | POA: Diagnosis not present

## 2022-04-23 DIAGNOSIS — N390 Urinary tract infection, site not specified: Secondary | ICD-10-CM | POA: Diagnosis not present

## 2022-04-23 DIAGNOSIS — M5442 Lumbago with sciatica, left side: Secondary | ICD-10-CM | POA: Diagnosis not present

## 2022-04-23 DIAGNOSIS — M810 Age-related osteoporosis without current pathological fracture: Secondary | ICD-10-CM | POA: Diagnosis not present

## 2022-04-23 DIAGNOSIS — M5441 Lumbago with sciatica, right side: Secondary | ICD-10-CM | POA: Diagnosis not present

## 2022-04-23 NOTE — Telephone Encounter (Signed)
     Patient  visit on 4/2  at [facility]  Have you been able to follow up with your primary care physician? Yes   The patient was or was not able to obtain any needed medicine or equipment. Yes   Are there diet recommendations that you are having difficulty following? Na   Patient expresses understanding of discharge instructions and education provided has no other needs at this time. Yes       [facility] Forth [facility] Guide, [facility] [phone] [address] [place], [place], [place] [address] Phone: [phone] Email: [email or web address]

## 2022-04-25 DIAGNOSIS — S81812D Laceration without foreign body, left lower leg, subsequent encounter: Secondary | ICD-10-CM | POA: Diagnosis not present

## 2022-04-25 DIAGNOSIS — M5442 Lumbago with sciatica, left side: Secondary | ICD-10-CM | POA: Diagnosis not present

## 2022-04-25 DIAGNOSIS — Z7982 Long term (current) use of aspirin: Secondary | ICD-10-CM | POA: Diagnosis not present

## 2022-04-25 DIAGNOSIS — Z9049 Acquired absence of other specified parts of digestive tract: Secondary | ICD-10-CM | POA: Diagnosis not present

## 2022-04-25 DIAGNOSIS — S0001XD Abrasion of scalp, subsequent encounter: Secondary | ICD-10-CM | POA: Diagnosis not present

## 2022-04-25 DIAGNOSIS — J4530 Mild persistent asthma, uncomplicated: Secondary | ICD-10-CM | POA: Diagnosis not present

## 2022-04-25 DIAGNOSIS — Z7951 Long term (current) use of inhaled steroids: Secondary | ICD-10-CM | POA: Diagnosis not present

## 2022-04-25 DIAGNOSIS — Z79899 Other long term (current) drug therapy: Secondary | ICD-10-CM | POA: Diagnosis not present

## 2022-04-25 DIAGNOSIS — M1990 Unspecified osteoarthritis, unspecified site: Secondary | ICD-10-CM | POA: Diagnosis not present

## 2022-04-25 DIAGNOSIS — I10 Essential (primary) hypertension: Secondary | ICD-10-CM | POA: Diagnosis not present

## 2022-04-25 DIAGNOSIS — M810 Age-related osteoporosis without current pathological fracture: Secondary | ICD-10-CM | POA: Diagnosis not present

## 2022-04-25 DIAGNOSIS — M5441 Lumbago with sciatica, right side: Secondary | ICD-10-CM | POA: Diagnosis not present

## 2022-04-25 DIAGNOSIS — F321 Major depressive disorder, single episode, moderate: Secondary | ICD-10-CM | POA: Diagnosis not present

## 2022-04-25 DIAGNOSIS — K219 Gastro-esophageal reflux disease without esophagitis: Secondary | ICD-10-CM | POA: Diagnosis not present

## 2022-04-25 DIAGNOSIS — G4700 Insomnia, unspecified: Secondary | ICD-10-CM | POA: Diagnosis not present

## 2022-04-25 DIAGNOSIS — N3281 Overactive bladder: Secondary | ICD-10-CM | POA: Diagnosis not present

## 2022-04-25 DIAGNOSIS — Z791 Long term (current) use of non-steroidal anti-inflammatories (NSAID): Secondary | ICD-10-CM | POA: Diagnosis not present

## 2022-04-25 DIAGNOSIS — D539 Nutritional anemia, unspecified: Secondary | ICD-10-CM | POA: Diagnosis not present

## 2022-04-25 DIAGNOSIS — N390 Urinary tract infection, site not specified: Secondary | ICD-10-CM | POA: Diagnosis not present

## 2022-04-25 DIAGNOSIS — Z9181 History of falling: Secondary | ICD-10-CM | POA: Diagnosis not present

## 2022-04-25 DIAGNOSIS — E785 Hyperlipidemia, unspecified: Secondary | ICD-10-CM | POA: Diagnosis not present

## 2022-04-25 DIAGNOSIS — R262 Difficulty in walking, not elsewhere classified: Secondary | ICD-10-CM | POA: Diagnosis not present

## 2022-04-25 DIAGNOSIS — E559 Vitamin D deficiency, unspecified: Secondary | ICD-10-CM | POA: Diagnosis not present

## 2022-04-25 DIAGNOSIS — G8929 Other chronic pain: Secondary | ICD-10-CM | POA: Diagnosis not present

## 2022-04-25 DIAGNOSIS — Z9841 Cataract extraction status, right eye: Secondary | ICD-10-CM | POA: Diagnosis not present

## 2022-04-26 DIAGNOSIS — M5441 Lumbago with sciatica, right side: Secondary | ICD-10-CM | POA: Diagnosis not present

## 2022-04-26 DIAGNOSIS — N390 Urinary tract infection, site not specified: Secondary | ICD-10-CM | POA: Diagnosis not present

## 2022-04-26 DIAGNOSIS — G8929 Other chronic pain: Secondary | ICD-10-CM | POA: Diagnosis not present

## 2022-04-26 DIAGNOSIS — M5442 Lumbago with sciatica, left side: Secondary | ICD-10-CM | POA: Diagnosis not present

## 2022-04-26 DIAGNOSIS — R262 Difficulty in walking, not elsewhere classified: Secondary | ICD-10-CM | POA: Diagnosis not present

## 2022-04-26 DIAGNOSIS — M810 Age-related osteoporosis without current pathological fracture: Secondary | ICD-10-CM | POA: Diagnosis not present

## 2022-04-27 DIAGNOSIS — R262 Difficulty in walking, not elsewhere classified: Secondary | ICD-10-CM | POA: Diagnosis not present

## 2022-04-27 DIAGNOSIS — G44309 Post-traumatic headache, unspecified, not intractable: Secondary | ICD-10-CM | POA: Diagnosis not present

## 2022-04-27 DIAGNOSIS — N3020 Other chronic cystitis without hematuria: Secondary | ICD-10-CM | POA: Diagnosis not present

## 2022-04-27 DIAGNOSIS — R531 Weakness: Secondary | ICD-10-CM | POA: Diagnosis not present

## 2022-04-28 DIAGNOSIS — N390 Urinary tract infection, site not specified: Secondary | ICD-10-CM | POA: Diagnosis not present

## 2022-04-28 DIAGNOSIS — M810 Age-related osteoporosis without current pathological fracture: Secondary | ICD-10-CM | POA: Diagnosis not present

## 2022-04-28 DIAGNOSIS — M5441 Lumbago with sciatica, right side: Secondary | ICD-10-CM | POA: Diagnosis not present

## 2022-04-28 DIAGNOSIS — G8929 Other chronic pain: Secondary | ICD-10-CM | POA: Diagnosis not present

## 2022-04-28 DIAGNOSIS — R262 Difficulty in walking, not elsewhere classified: Secondary | ICD-10-CM | POA: Diagnosis not present

## 2022-04-28 DIAGNOSIS — M5442 Lumbago with sciatica, left side: Secondary | ICD-10-CM | POA: Diagnosis not present

## 2022-04-30 DIAGNOSIS — M5441 Lumbago with sciatica, right side: Secondary | ICD-10-CM | POA: Diagnosis not present

## 2022-04-30 DIAGNOSIS — R262 Difficulty in walking, not elsewhere classified: Secondary | ICD-10-CM | POA: Diagnosis not present

## 2022-04-30 DIAGNOSIS — M810 Age-related osteoporosis without current pathological fracture: Secondary | ICD-10-CM | POA: Diagnosis not present

## 2022-04-30 DIAGNOSIS — G8929 Other chronic pain: Secondary | ICD-10-CM | POA: Diagnosis not present

## 2022-04-30 DIAGNOSIS — M5442 Lumbago with sciatica, left side: Secondary | ICD-10-CM | POA: Diagnosis not present

## 2022-04-30 DIAGNOSIS — N390 Urinary tract infection, site not specified: Secondary | ICD-10-CM | POA: Diagnosis not present

## 2022-05-03 DIAGNOSIS — M5442 Lumbago with sciatica, left side: Secondary | ICD-10-CM | POA: Diagnosis not present

## 2022-05-03 DIAGNOSIS — M810 Age-related osteoporosis without current pathological fracture: Secondary | ICD-10-CM | POA: Diagnosis not present

## 2022-05-03 DIAGNOSIS — R262 Difficulty in walking, not elsewhere classified: Secondary | ICD-10-CM | POA: Diagnosis not present

## 2022-05-03 DIAGNOSIS — G8929 Other chronic pain: Secondary | ICD-10-CM | POA: Diagnosis not present

## 2022-05-03 DIAGNOSIS — N390 Urinary tract infection, site not specified: Secondary | ICD-10-CM | POA: Diagnosis not present

## 2022-05-03 DIAGNOSIS — M5441 Lumbago with sciatica, right side: Secondary | ICD-10-CM | POA: Diagnosis not present

## 2022-05-05 DIAGNOSIS — M5441 Lumbago with sciatica, right side: Secondary | ICD-10-CM | POA: Diagnosis not present

## 2022-05-05 DIAGNOSIS — G8929 Other chronic pain: Secondary | ICD-10-CM | POA: Diagnosis not present

## 2022-05-05 DIAGNOSIS — R262 Difficulty in walking, not elsewhere classified: Secondary | ICD-10-CM | POA: Diagnosis not present

## 2022-05-05 DIAGNOSIS — M5442 Lumbago with sciatica, left side: Secondary | ICD-10-CM | POA: Diagnosis not present

## 2022-05-05 DIAGNOSIS — M810 Age-related osteoporosis without current pathological fracture: Secondary | ICD-10-CM | POA: Diagnosis not present

## 2022-05-05 DIAGNOSIS — N390 Urinary tract infection, site not specified: Secondary | ICD-10-CM | POA: Diagnosis not present

## 2022-05-06 IMAGING — CT CT CERVICAL SPINE W/O CM
2 series · 9 of 27 positions shown, 11 images · non-contrast
Comparison: MRI 06/17/2017, CT head and cervical spine 06/17/2017

CLINICAL DATA: Fall and syncopal episode, left cheek pain and
infraorbital bruising

EXAM:
CT HEAD WITHOUT CONTRAST
CT MAXILLOFACIAL WITHOUT CONTRAST
CT CERVICAL SPINE WITHOUT CONTRAST
TECHNIQUE: Multidetector CT imaging of the head, cervical spine, and
maxillofacial structures were performed using the standard protocol
without intravenous contrast. Multiplanar CT image reconstructions
of the cervical spine and maxillofacial structures were also
generated.

[Series 4: c spine soft · axial · 0.34mm/px · z∈[-229,-121]mm · 4 of 78 slices shown, 5 images]
[im 12/78  soft-tissue]
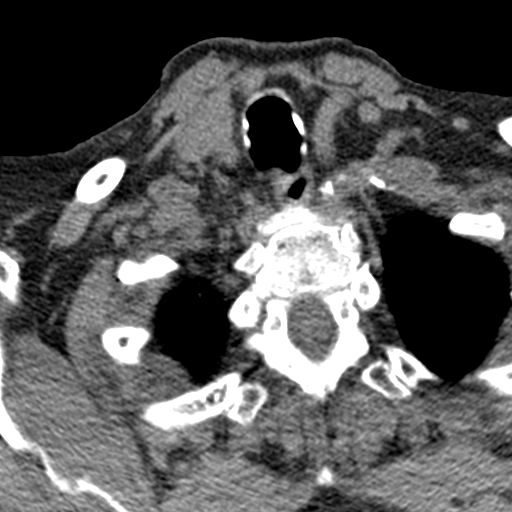
[im 12/78  bone]
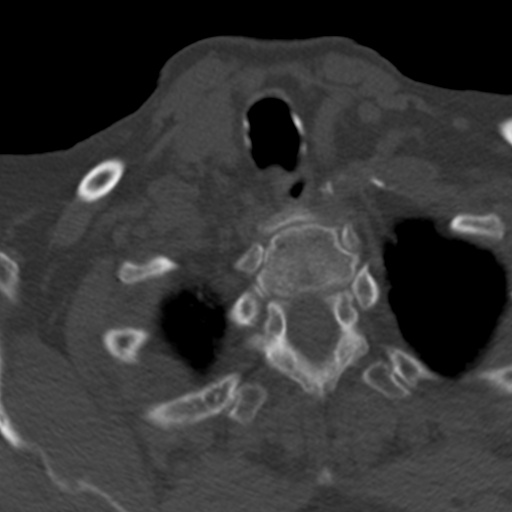
[im 30/78  bone]
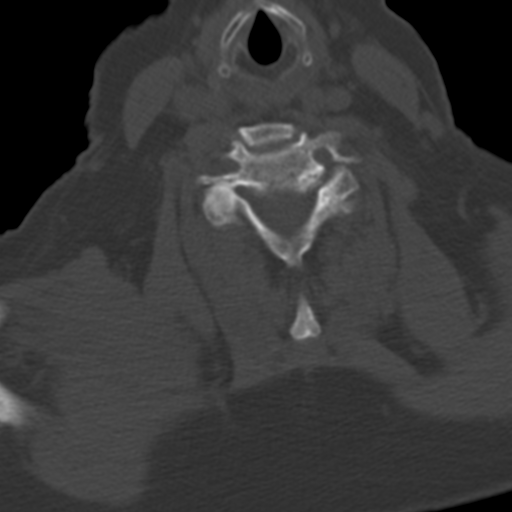
[im 48/78  bone]
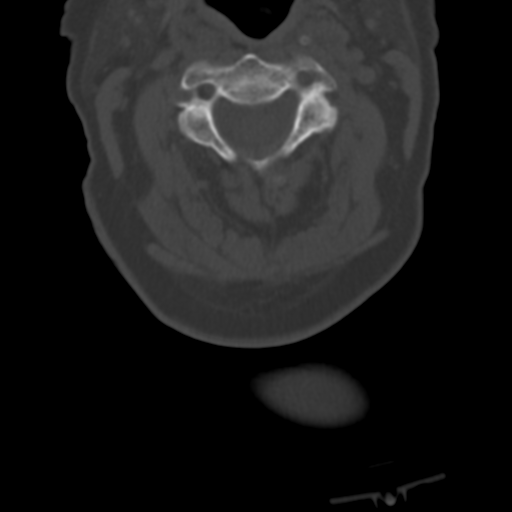
[im 66/78  bone]
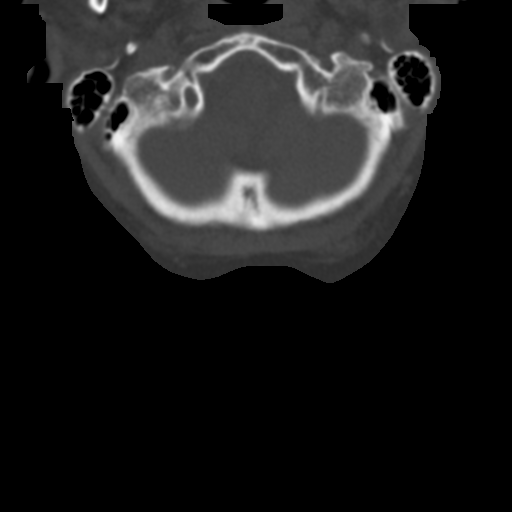

[Series 8: sagittal bone · sagittal · 0.23mm/px · 5 of 57 slices shown, 6 images]
[im 19/57  bone]
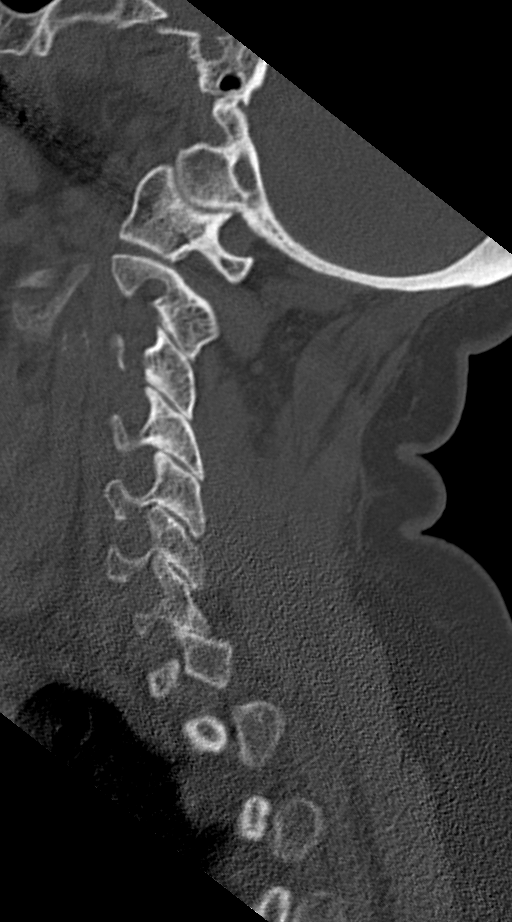
[im 24/57  bone]
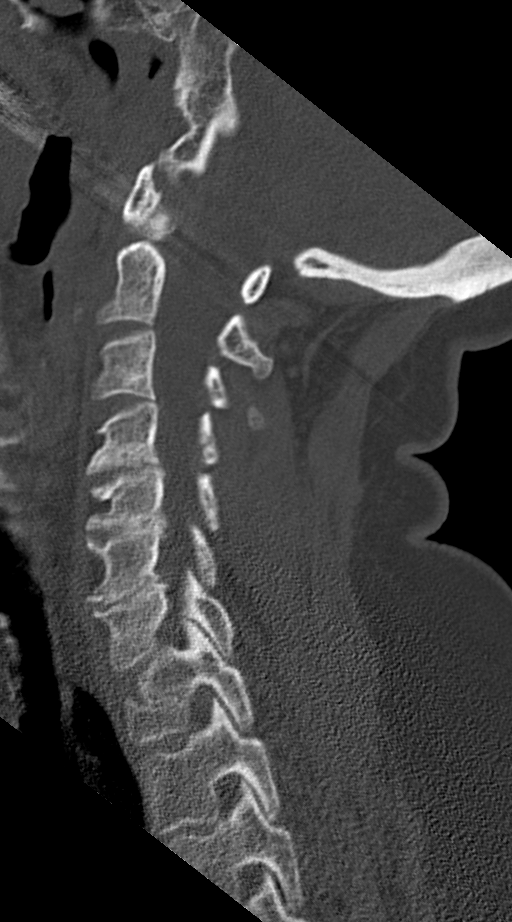
[im 29/57  soft-tissue]
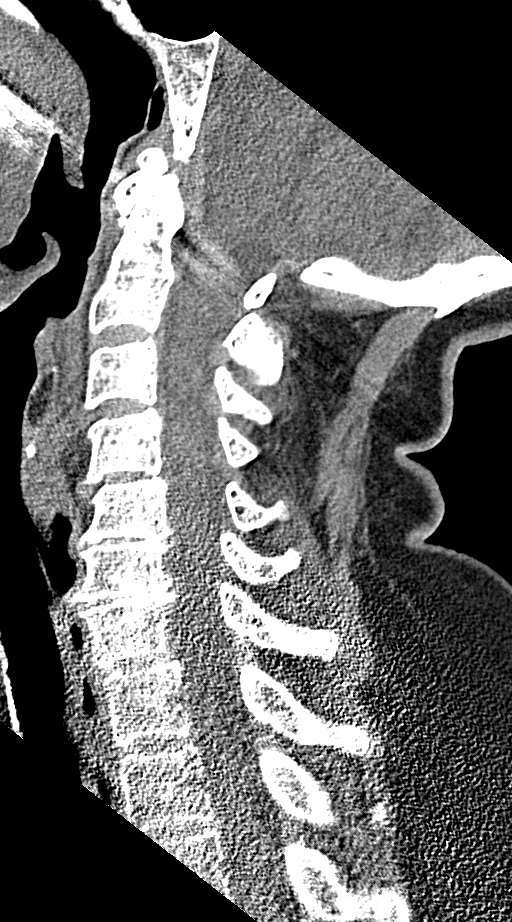
[im 29/57  bone]
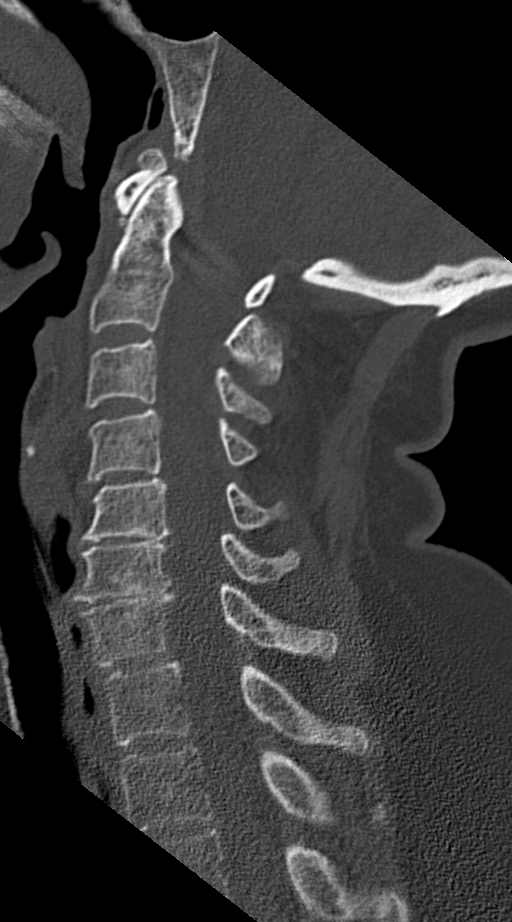
[im 33/57  bone]
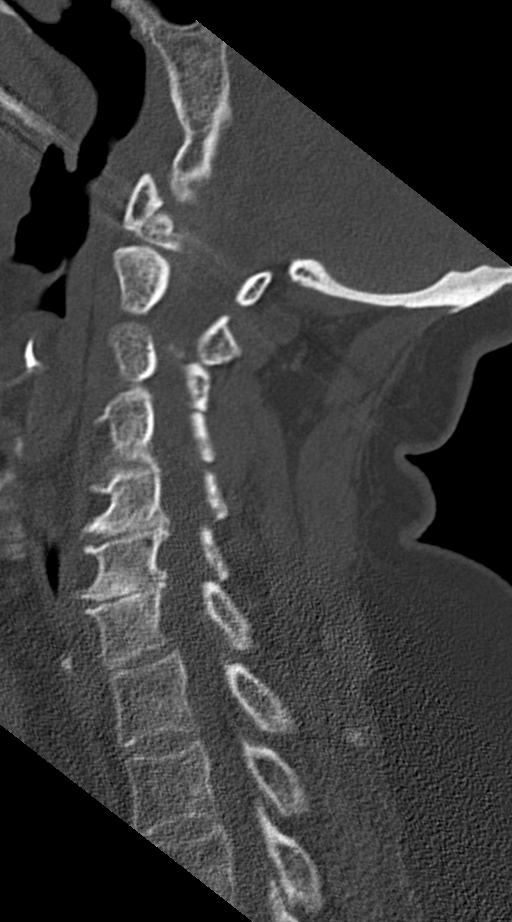
[im 38/57  bone]
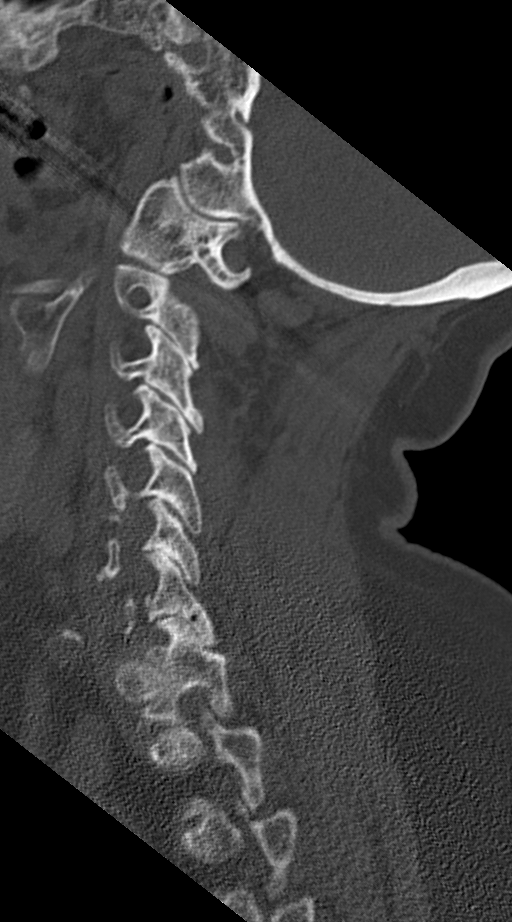

[9 of 27 positions shown; findings below may reference images not displayed]

FINDINGS: CT HEAD FINDINGS

Brain: No evidence of acute infarction, hemorrhage, hydrocephalus,
extra-axial collection or mass lesion/mass effect. Basal cisterns
are patent. Midline intracranial structures are normal. Cerebellar
tonsils are normally positioned. Symmetric prominence of the
ventricles, cisterns and sulci compatible with parenchymal volume
loss. Patchy areas of white matter hypoattenuation are most
compatible with mild-to-moderate chronic microvascular angiopathy.
Benign dural calcifications are similar to priors.

Vascular: Atherosclerotic calcification of the carotid siphons. No
hyperdense vessel.

Skull: Minimal left frontal scalp swelling without large hematoma or
calvarial fracture. No other acute or suspicious osseous abnormality

Other: None.

CT MAXILLOFACIAL FINDINGS

Osseous: No fracture of the bony orbits. Nasal bones are intact. No
other mid face fractures are seen. The pterygoid plates are intact.
No visible or suspected temporal bone fractures. Temporomandibular
joints are normally aligned. The mandible is intact. No fractured or
avulsed teeth.

Orbits: Left infraorbital soft tissue swelling. No retro septal gas,
stranding or hemorrhage. Prior bilateral lens extractions. Globes
appear otherwise normal and symmetric. Symmetric appearance of the
extraocular musculature and optic nerve sheath complexes. Normal
caliber of the superior ophthalmic veins.

Sinuses: Paranasal sinuses and mastoid air cells are predominantly
clear. Middle ear cavities are clear. Ossicular chains are normally
configured.

Soft tissues: Left infraorbital and malar soft tissue swelling
without soft tissue gas or foreign body. Remote soft tissue
thickening and radiodensity in the subcutaneous tissues of the
superior nasal bridge unchanged from prior and may reflect some
chronic scarring. No other significant soft tissue abnormalities.

CT CERVICAL SPINE FINDINGS

Alignment: Stabilization collar is in place at the time of
examination. There is mild straightening of the normal cervical
lordosis which is slightly more pronounced than on the comparison
imaging in the lower cervical spine possibly related to positioning,
stabilization or potential muscle spasm. Stepwise retrolisthesis
C5-C7 and anterolisthesis C7 on T1 are grossly unchanged from
comparison and favored to be on a degenerative, spondylitic basis.
No evidence of traumatic listhesis. No abnormally widened, perched
or jumped facets. Normal alignment of the craniocervical and
atlantoaxial articulations.

Skull base and vertebrae: No acute skull base fracture. No vertebral
body fracture or height loss. Normal bone mineralization. No
worrisome osseous lesions. Moderate atlantodental arthrosis with
periarticular spurring and pseudoarticulation with the basion is
unchanged from comparison. Multilevel spondylitic changes are better
detailed below.

Soft tissues and spinal canal: No pre or paravertebral fluid or
swelling. No visible canal hematoma. Cervical carotid
atherosclerosis with a slightly medialized course of the right
internal carotid artery.

Disc levels: Multilevel intervertebral disc height loss with
spondylitic endplate changes. Slightly larger disc osteophyte
complexes are present C4-C7 with at most mild canal stenosis at the
C6-7 level. Multilevel uncinate spurring and facet hypertrophic
changes are present as well with mild multilevel neural foraminal
narrowing and more moderate bilateral foraminal impingement at C5-6
and C6-7.

Upper chest: Stable calcified granuloma seen in the right lung apex.
No acute abnormality in the upper chest or imaged lung apices. Some
mild calcification in the visible proximal great vessels.

Other: Normal thyroid gland.
IMPRESSION: 1. Minimal left frontal scalp swelling without large hematoma or
calvarial fracture. No acute intracranial abnormality.
2. Stable mild-to-moderate parenchymal volume loss and chronic
microvascular ischemic white matter disease.
3. Left infraorbital and malar soft tissue swelling without soft
tissue gas or foreign body.
4. No acute facial bone fracture or orbital injury identified.
5. No acute cervical spine fracture or traumatic listhesis.
6. Multilevel degenerative changes of the cervical spine as
described above.
7. Cervical and intracranial atherosclerosis.

## 2022-05-06 IMAGING — CR DG KNEE COMPLETE 4+V*L*
4 series · 4 of 4 positions shown · non-contrast
Comparison: 05/18/2016

CLINICAL DATA: Pt states she fell while visiting friend. Per notes-
syncopal episode-complaining of left knee pain

EXAM:
LEFT KNEE - COMPLETE 4+ VIEW

[t knee ap left]
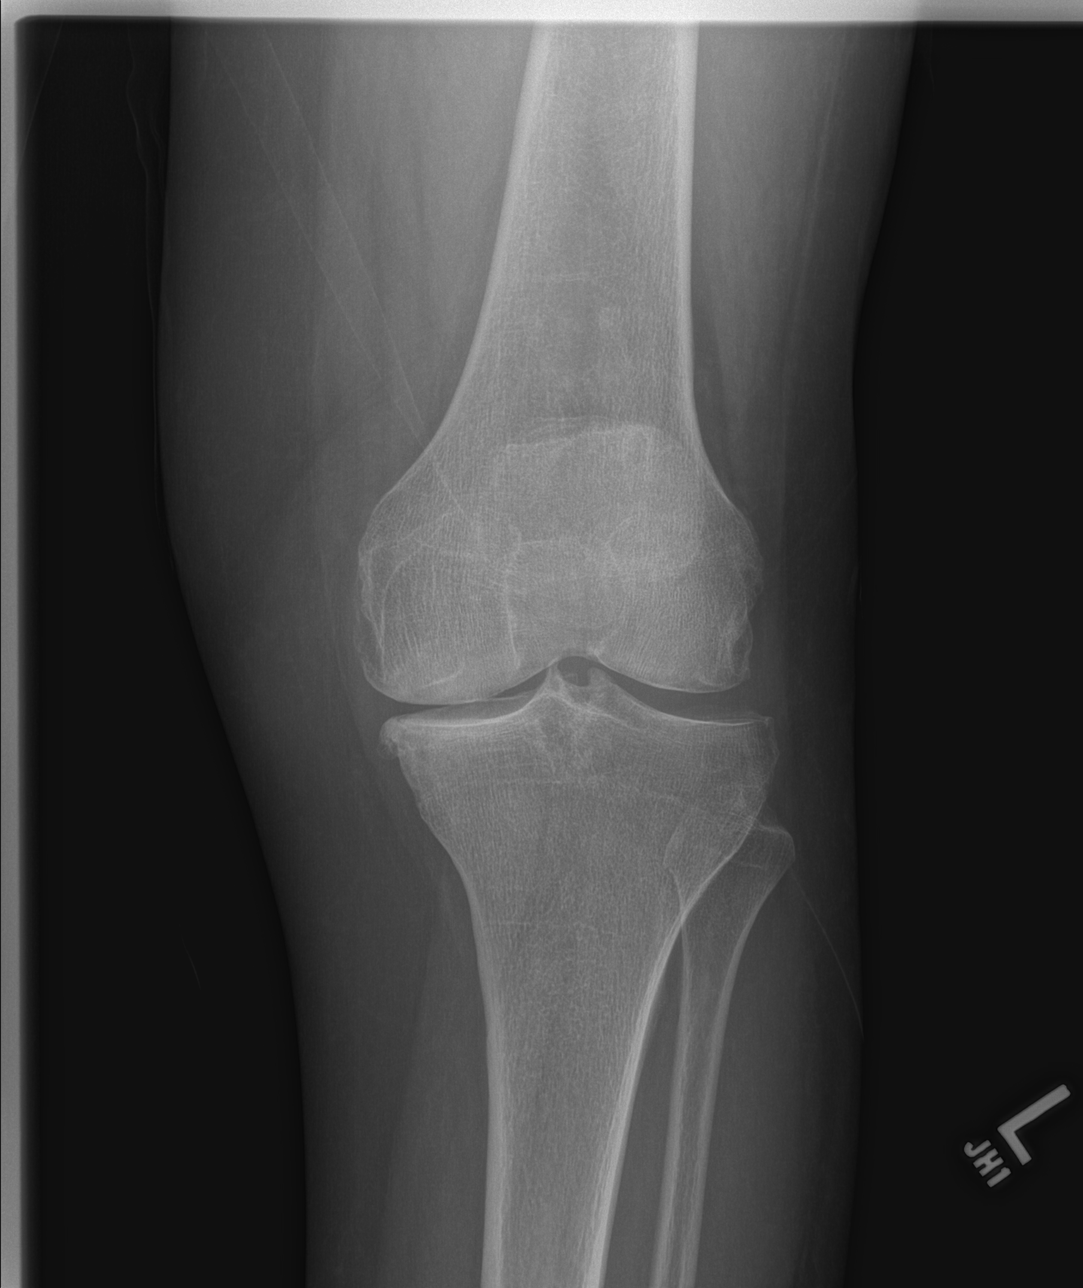

[t knee obl left (1 of 2)]
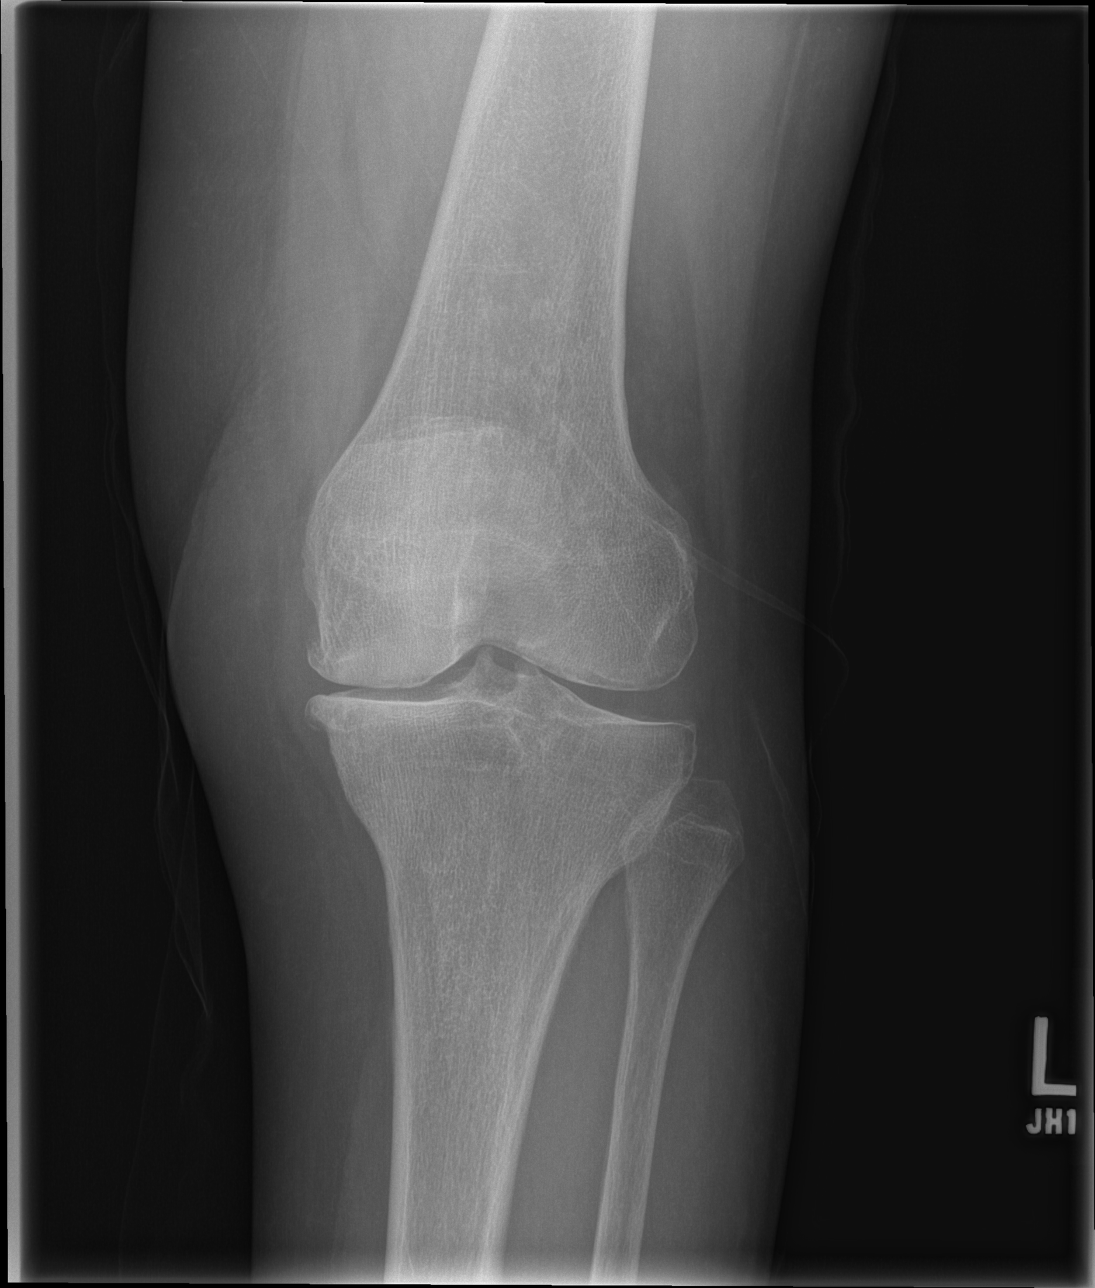

[t knee obl left (2 of 2)]
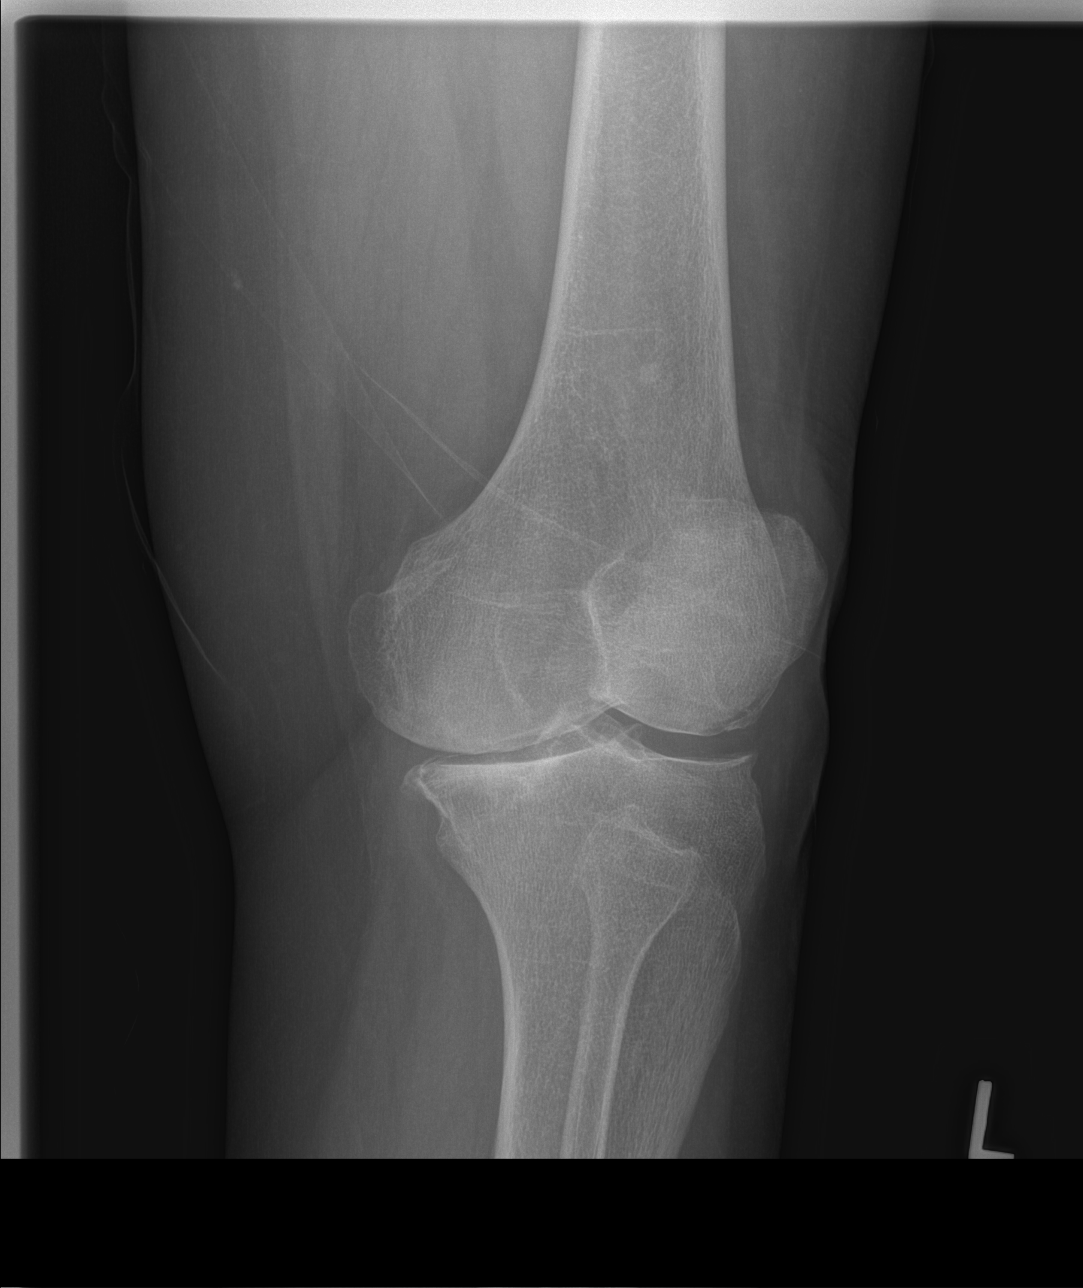

[x knee lat left]
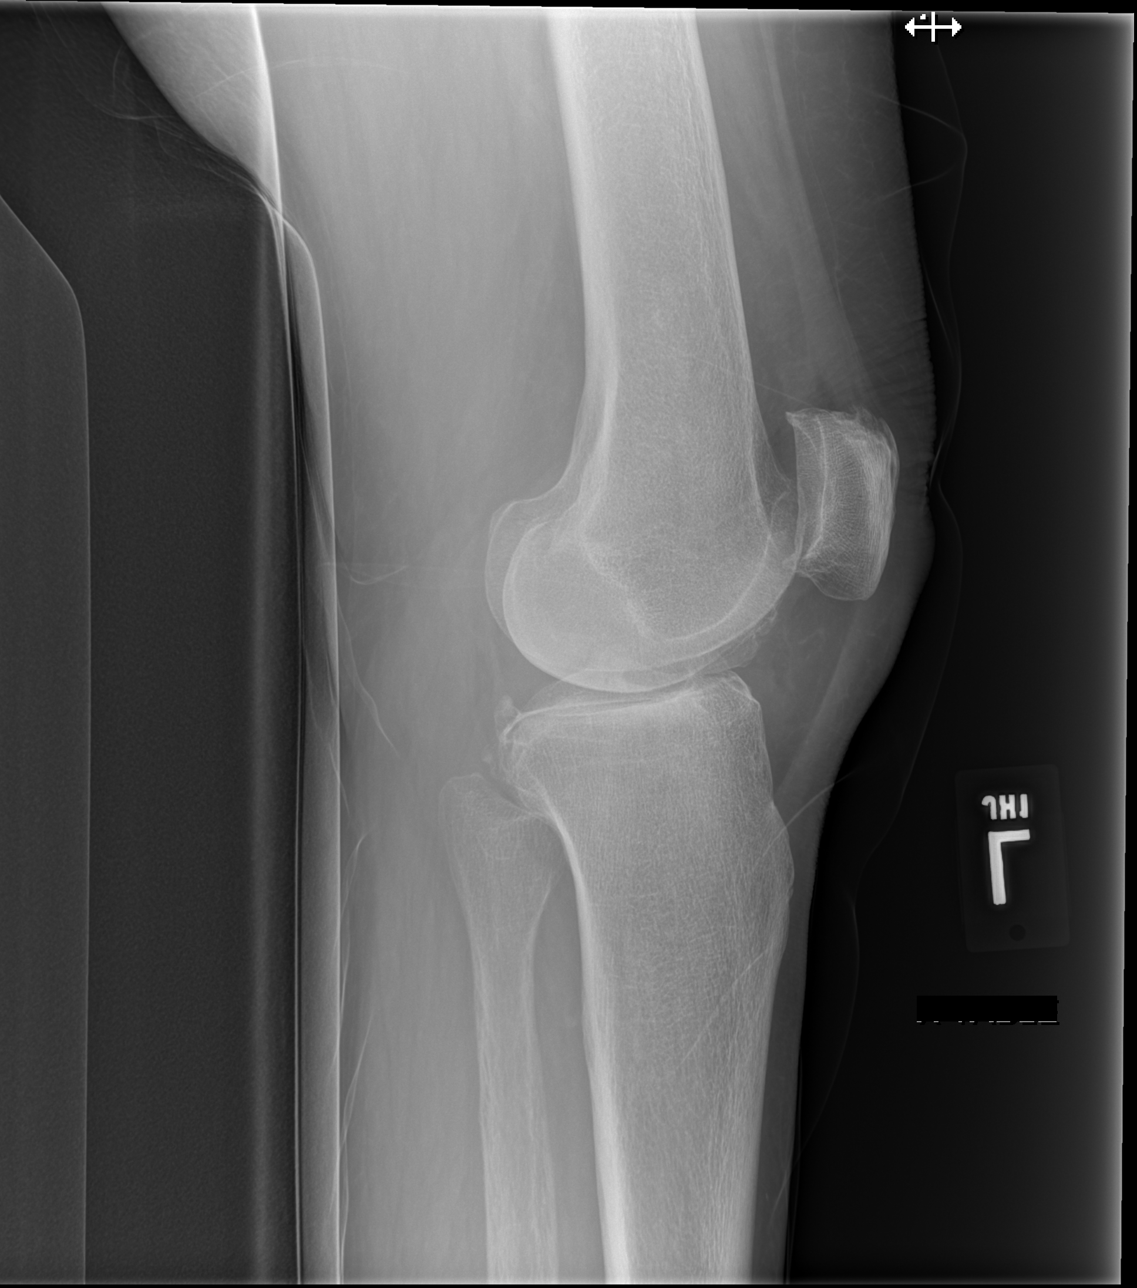

[4 of 4 positions shown; findings below may reference images not displayed]

FINDINGS: No fracture or bone lesion.

Moderate medial joint space compartment narrowing associated
marginal osteophytes. Small marginal osteophytes from patella.
Lateral compartment is relatively well preserved.

No joint effusion.

Surrounding soft tissues are unremarkable.
IMPRESSION: 1. No fracture or acute finding.
2. Moderate osteoarthritis.

## 2022-05-07 DIAGNOSIS — G8929 Other chronic pain: Secondary | ICD-10-CM | POA: Diagnosis not present

## 2022-05-07 DIAGNOSIS — M5442 Lumbago with sciatica, left side: Secondary | ICD-10-CM | POA: Diagnosis not present

## 2022-05-07 DIAGNOSIS — M5441 Lumbago with sciatica, right side: Secondary | ICD-10-CM | POA: Diagnosis not present

## 2022-05-07 DIAGNOSIS — M810 Age-related osteoporosis without current pathological fracture: Secondary | ICD-10-CM | POA: Diagnosis not present

## 2022-05-07 DIAGNOSIS — R262 Difficulty in walking, not elsewhere classified: Secondary | ICD-10-CM | POA: Diagnosis not present

## 2022-05-07 DIAGNOSIS — N390 Urinary tract infection, site not specified: Secondary | ICD-10-CM | POA: Diagnosis not present

## 2022-05-10 DIAGNOSIS — R262 Difficulty in walking, not elsewhere classified: Secondary | ICD-10-CM | POA: Diagnosis not present

## 2022-05-10 DIAGNOSIS — M5441 Lumbago with sciatica, right side: Secondary | ICD-10-CM | POA: Diagnosis not present

## 2022-05-10 DIAGNOSIS — N390 Urinary tract infection, site not specified: Secondary | ICD-10-CM | POA: Diagnosis not present

## 2022-05-10 DIAGNOSIS — M5442 Lumbago with sciatica, left side: Secondary | ICD-10-CM | POA: Diagnosis not present

## 2022-05-10 DIAGNOSIS — R4182 Altered mental status, unspecified: Secondary | ICD-10-CM | POA: Diagnosis not present

## 2022-05-10 DIAGNOSIS — E538 Deficiency of other specified B group vitamins: Secondary | ICD-10-CM | POA: Diagnosis not present

## 2022-05-10 DIAGNOSIS — G44309 Post-traumatic headache, unspecified, not intractable: Secondary | ICD-10-CM | POA: Diagnosis not present

## 2022-05-10 DIAGNOSIS — M810 Age-related osteoporosis without current pathological fracture: Secondary | ICD-10-CM | POA: Diagnosis not present

## 2022-05-10 DIAGNOSIS — G8929 Other chronic pain: Secondary | ICD-10-CM | POA: Diagnosis not present

## 2022-05-11 DIAGNOSIS — R262 Difficulty in walking, not elsewhere classified: Secondary | ICD-10-CM | POA: Diagnosis not present

## 2022-05-11 DIAGNOSIS — M5442 Lumbago with sciatica, left side: Secondary | ICD-10-CM | POA: Diagnosis not present

## 2022-05-11 DIAGNOSIS — M810 Age-related osteoporosis without current pathological fracture: Secondary | ICD-10-CM | POA: Diagnosis not present

## 2022-05-11 DIAGNOSIS — N390 Urinary tract infection, site not specified: Secondary | ICD-10-CM | POA: Diagnosis not present

## 2022-05-11 DIAGNOSIS — G8929 Other chronic pain: Secondary | ICD-10-CM | POA: Diagnosis not present

## 2022-05-11 DIAGNOSIS — M5441 Lumbago with sciatica, right side: Secondary | ICD-10-CM | POA: Diagnosis not present

## 2022-05-14 DIAGNOSIS — M5441 Lumbago with sciatica, right side: Secondary | ICD-10-CM | POA: Diagnosis not present

## 2022-05-14 DIAGNOSIS — N390 Urinary tract infection, site not specified: Secondary | ICD-10-CM | POA: Diagnosis not present

## 2022-05-14 DIAGNOSIS — M5442 Lumbago with sciatica, left side: Secondary | ICD-10-CM | POA: Diagnosis not present

## 2022-05-14 DIAGNOSIS — G8929 Other chronic pain: Secondary | ICD-10-CM | POA: Diagnosis not present

## 2022-05-14 DIAGNOSIS — M810 Age-related osteoporosis without current pathological fracture: Secondary | ICD-10-CM | POA: Diagnosis not present

## 2022-05-14 DIAGNOSIS — R262 Difficulty in walking, not elsewhere classified: Secondary | ICD-10-CM | POA: Diagnosis not present

## 2022-05-17 DIAGNOSIS — M810 Age-related osteoporosis without current pathological fracture: Secondary | ICD-10-CM | POA: Diagnosis not present

## 2022-05-17 DIAGNOSIS — R262 Difficulty in walking, not elsewhere classified: Secondary | ICD-10-CM | POA: Diagnosis not present

## 2022-05-17 DIAGNOSIS — G8929 Other chronic pain: Secondary | ICD-10-CM | POA: Diagnosis not present

## 2022-05-17 DIAGNOSIS — M5441 Lumbago with sciatica, right side: Secondary | ICD-10-CM | POA: Diagnosis not present

## 2022-05-17 DIAGNOSIS — M5442 Lumbago with sciatica, left side: Secondary | ICD-10-CM | POA: Diagnosis not present

## 2022-05-17 DIAGNOSIS — N390 Urinary tract infection, site not specified: Secondary | ICD-10-CM | POA: Diagnosis not present

## 2022-05-24 DIAGNOSIS — G8929 Other chronic pain: Secondary | ICD-10-CM | POA: Diagnosis not present

## 2022-05-24 DIAGNOSIS — R262 Difficulty in walking, not elsewhere classified: Secondary | ICD-10-CM | POA: Diagnosis not present

## 2022-05-24 DIAGNOSIS — M5441 Lumbago with sciatica, right side: Secondary | ICD-10-CM | POA: Diagnosis not present

## 2022-05-24 DIAGNOSIS — M5442 Lumbago with sciatica, left side: Secondary | ICD-10-CM | POA: Diagnosis not present

## 2022-05-24 DIAGNOSIS — M810 Age-related osteoporosis without current pathological fracture: Secondary | ICD-10-CM | POA: Diagnosis not present

## 2022-05-24 DIAGNOSIS — N390 Urinary tract infection, site not specified: Secondary | ICD-10-CM | POA: Diagnosis not present

## 2022-05-25 DIAGNOSIS — N390 Urinary tract infection, site not specified: Secondary | ICD-10-CM | POA: Diagnosis not present

## 2022-05-25 DIAGNOSIS — K219 Gastro-esophageal reflux disease without esophagitis: Secondary | ICD-10-CM | POA: Diagnosis not present

## 2022-05-25 DIAGNOSIS — E785 Hyperlipidemia, unspecified: Secondary | ICD-10-CM | POA: Diagnosis not present

## 2022-05-25 DIAGNOSIS — J4530 Mild persistent asthma, uncomplicated: Secondary | ICD-10-CM | POA: Diagnosis not present

## 2022-05-25 DIAGNOSIS — Z7951 Long term (current) use of inhaled steroids: Secondary | ICD-10-CM | POA: Diagnosis not present

## 2022-05-25 DIAGNOSIS — E559 Vitamin D deficiency, unspecified: Secondary | ICD-10-CM | POA: Diagnosis not present

## 2022-05-25 DIAGNOSIS — M810 Age-related osteoporosis without current pathological fracture: Secondary | ICD-10-CM | POA: Diagnosis not present

## 2022-05-25 DIAGNOSIS — I10 Essential (primary) hypertension: Secondary | ICD-10-CM | POA: Diagnosis not present

## 2022-05-25 DIAGNOSIS — M5441 Lumbago with sciatica, right side: Secondary | ICD-10-CM | POA: Diagnosis not present

## 2022-05-25 DIAGNOSIS — S0001XD Abrasion of scalp, subsequent encounter: Secondary | ICD-10-CM | POA: Diagnosis not present

## 2022-05-25 DIAGNOSIS — D539 Nutritional anemia, unspecified: Secondary | ICD-10-CM | POA: Diagnosis not present

## 2022-05-25 DIAGNOSIS — G8929 Other chronic pain: Secondary | ICD-10-CM | POA: Diagnosis not present

## 2022-05-25 DIAGNOSIS — M5442 Lumbago with sciatica, left side: Secondary | ICD-10-CM | POA: Diagnosis not present

## 2022-05-25 DIAGNOSIS — Z7982 Long term (current) use of aspirin: Secondary | ICD-10-CM | POA: Diagnosis not present

## 2022-05-25 DIAGNOSIS — M1990 Unspecified osteoarthritis, unspecified site: Secondary | ICD-10-CM | POA: Diagnosis not present

## 2022-05-25 DIAGNOSIS — Z9841 Cataract extraction status, right eye: Secondary | ICD-10-CM | POA: Diagnosis not present

## 2022-05-25 DIAGNOSIS — Z79899 Other long term (current) drug therapy: Secondary | ICD-10-CM | POA: Diagnosis not present

## 2022-05-25 DIAGNOSIS — Z9181 History of falling: Secondary | ICD-10-CM | POA: Diagnosis not present

## 2022-05-25 DIAGNOSIS — N3281 Overactive bladder: Secondary | ICD-10-CM | POA: Diagnosis not present

## 2022-05-25 DIAGNOSIS — G4700 Insomnia, unspecified: Secondary | ICD-10-CM | POA: Diagnosis not present

## 2022-05-25 DIAGNOSIS — S81812D Laceration without foreign body, left lower leg, subsequent encounter: Secondary | ICD-10-CM | POA: Diagnosis not present

## 2022-05-25 DIAGNOSIS — F321 Major depressive disorder, single episode, moderate: Secondary | ICD-10-CM | POA: Diagnosis not present

## 2022-05-25 DIAGNOSIS — R262 Difficulty in walking, not elsewhere classified: Secondary | ICD-10-CM | POA: Diagnosis not present

## 2022-05-25 DIAGNOSIS — Z791 Long term (current) use of non-steroidal anti-inflammatories (NSAID): Secondary | ICD-10-CM | POA: Diagnosis not present

## 2022-05-25 DIAGNOSIS — Z9049 Acquired absence of other specified parts of digestive tract: Secondary | ICD-10-CM | POA: Diagnosis not present

## 2022-05-26 ENCOUNTER — Encounter: Payer: Self-pay | Admitting: Physician Assistant

## 2022-05-27 DIAGNOSIS — J4540 Moderate persistent asthma, uncomplicated: Secondary | ICD-10-CM | POA: Diagnosis not present

## 2022-05-28 DIAGNOSIS — R262 Difficulty in walking, not elsewhere classified: Secondary | ICD-10-CM | POA: Diagnosis not present

## 2022-05-28 DIAGNOSIS — G8929 Other chronic pain: Secondary | ICD-10-CM | POA: Diagnosis not present

## 2022-05-28 DIAGNOSIS — M5442 Lumbago with sciatica, left side: Secondary | ICD-10-CM | POA: Diagnosis not present

## 2022-05-28 DIAGNOSIS — N390 Urinary tract infection, site not specified: Secondary | ICD-10-CM | POA: Diagnosis not present

## 2022-05-28 DIAGNOSIS — M5441 Lumbago with sciatica, right side: Secondary | ICD-10-CM | POA: Diagnosis not present

## 2022-05-28 DIAGNOSIS — M810 Age-related osteoporosis without current pathological fracture: Secondary | ICD-10-CM | POA: Diagnosis not present

## 2022-05-31 DIAGNOSIS — M810 Age-related osteoporosis without current pathological fracture: Secondary | ICD-10-CM | POA: Diagnosis not present

## 2022-05-31 DIAGNOSIS — R262 Difficulty in walking, not elsewhere classified: Secondary | ICD-10-CM | POA: Diagnosis not present

## 2022-05-31 DIAGNOSIS — N390 Urinary tract infection, site not specified: Secondary | ICD-10-CM | POA: Diagnosis not present

## 2022-05-31 DIAGNOSIS — M5442 Lumbago with sciatica, left side: Secondary | ICD-10-CM | POA: Diagnosis not present

## 2022-05-31 DIAGNOSIS — G8929 Other chronic pain: Secondary | ICD-10-CM | POA: Diagnosis not present

## 2022-05-31 DIAGNOSIS — M5441 Lumbago with sciatica, right side: Secondary | ICD-10-CM | POA: Diagnosis not present

## 2022-06-01 DIAGNOSIS — N390 Urinary tract infection, site not specified: Secondary | ICD-10-CM | POA: Diagnosis not present

## 2022-06-01 DIAGNOSIS — N3281 Overactive bladder: Secondary | ICD-10-CM | POA: Diagnosis not present

## 2022-06-02 NOTE — Progress Notes (Signed)
[facility]  [address] [place],  [place]  [address] [phone]  Clinic Day:  06/03/2022  Referring physician: Hurshel Party, NP  HISTORY OF PRESENT ILLNESS:  The patient is an 82 y.o. 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  She is currently taking letrozole for her adjuvant endocrine therapy.  She comes in today for routine follow-up.  Since her last visit, the patient has been doing well.  She denies noticing any masses or other findings which concern her for early disease recurrence.  PHYSICAL EXAM:  Blood pressure (!) 161/87, pulse (!) 104, temperature 98 F (36.7 C), temperature source Oral, resp. rate 18, height 5\' 3"  (1.6 m), weight 166 lb 9.6 oz (75.6 kg), SpO2 90 %. Wt Readings from Last 3 Encounters:  06/03/22 166 lb 9.6 oz (75.6 kg)  02/02/22 165 lb 11.2 oz (75.2 kg)  09/30/21 167 lb 3.2 oz (75.8 kg)   Body mass index is 29.51 kg/m. Performance status (ECOG): 1 - Symptomatic but completely ambulatory Physical Exam Constitutional:      Appearance: Normal appearance.  HENT:     Mouth/Throat:     Pharynx: Oropharynx is clear. No oropharyngeal exudate.  Cardiovascular:     Rate and Rhythm: Normal rate and regular rhythm.     Heart sounds: No murmur heard.    No friction rub. No gallop.  Pulmonary:     Breath sounds: Normal breath sounds.  Chest:  Breasts:    Right: No swelling, bleeding, inverted nipple, mass, nipple discharge or skin change.     Left: No swelling, bleeding, inverted nipple, mass, nipple discharge or skin change.  Abdominal:     General: Bowel sounds are normal. There is no distension.     Palpations: Abdomen is soft. There is no mass.     Tenderness: There is no abdominal tenderness.  Musculoskeletal:        General: No tenderness.     Cervical back: Normal range of motion and neck supple.     Right lower leg: No edema.     Left  lower leg: No edema.  Lymphadenopathy:     Cervical: No cervical adenopathy.     Right cervical: No superficial, deep or posterior cervical adenopathy.    Left cervical: No superficial, deep or posterior cervical adenopathy.     Upper Body:     Right upper body: No supraclavicular or axillary adenopathy.     Left upper body: No supraclavicular or axillary adenopathy.     Lower Body: No right inguinal adenopathy. No left inguinal adenopathy.  Skin:    Coloration: Skin is not jaundiced.     Findings: No lesion or rash.  Neurological:     General: No focal deficit present.     Mental Status: She is alert and oriented to person, place, and time. Mental status is at baseline.  Psychiatric:        Mood and Affect: Mood normal.        Behavior: Behavior normal.        Thought Content: Thought content normal.        Judgment: Judgment normal.   ASSESSMENT & PLAN:  An 82 y.o. 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  Based upon her clinical breast exam today, the patient remains disease-free.  She knows to continue  taking her letrozole on a daily basis for 5 years of adjuvant endocrine therapy.  I will see her back in 4 months for her next clinical breast exam.  The patient understands all the plans discussed today and is in agreement with them.  Terren Jandreau Kirby Funk, MD

## 2022-06-03 ENCOUNTER — Inpatient Hospital Stay: Payer: Medicare Other | Attending: Oncology | Admitting: Oncology

## 2022-06-03 VITALS — BP 161/87 | HR 104 | Temp 98.0°F | Resp 18 | Ht 63.0 in | Wt 166.6 lb

## 2022-06-03 DIAGNOSIS — C50312 Malignant neoplasm of lower-inner quadrant of left female breast: Secondary | ICD-10-CM

## 2022-06-03 DIAGNOSIS — Z170 Estrogen receptor positive status [ER+]: Secondary | ICD-10-CM | POA: Diagnosis not present

## 2022-06-07 ENCOUNTER — Telehealth: Payer: Self-pay | Admitting: Oncology

## 2022-06-07 DIAGNOSIS — R399 Unspecified symptoms and signs involving the genitourinary system: Secondary | ICD-10-CM | POA: Diagnosis not present

## 2022-06-07 DIAGNOSIS — M810 Age-related osteoporosis without current pathological fracture: Secondary | ICD-10-CM | POA: Diagnosis not present

## 2022-06-07 DIAGNOSIS — M5441 Lumbago with sciatica, right side: Secondary | ICD-10-CM | POA: Diagnosis not present

## 2022-06-07 DIAGNOSIS — G8929 Other chronic pain: Secondary | ICD-10-CM | POA: Diagnosis not present

## 2022-06-07 DIAGNOSIS — R4182 Altered mental status, unspecified: Secondary | ICD-10-CM | POA: Diagnosis not present

## 2022-06-07 DIAGNOSIS — R262 Difficulty in walking, not elsewhere classified: Secondary | ICD-10-CM | POA: Diagnosis not present

## 2022-06-07 DIAGNOSIS — M5442 Lumbago with sciatica, left side: Secondary | ICD-10-CM | POA: Diagnosis not present

## 2022-06-07 DIAGNOSIS — N390 Urinary tract infection, site not specified: Secondary | ICD-10-CM | POA: Diagnosis not present

## 2022-06-07 NOTE — Telephone Encounter (Signed)
06/07/22 LVM next appt scheduled on 10/07/22@1015am 

## 2022-06-09 DIAGNOSIS — R296 Repeated falls: Secondary | ICD-10-CM | POA: Diagnosis not present

## 2022-06-09 DIAGNOSIS — Z043 Encounter for examination and observation following other accident: Secondary | ICD-10-CM | POA: Diagnosis not present

## 2022-06-09 DIAGNOSIS — Z23 Encounter for immunization: Secondary | ICD-10-CM | POA: Diagnosis not present

## 2022-06-09 DIAGNOSIS — S61216A Laceration without foreign body of right little finger without damage to nail, initial encounter: Secondary | ICD-10-CM | POA: Diagnosis not present

## 2022-06-09 DIAGNOSIS — E538 Deficiency of other specified B group vitamins: Secondary | ICD-10-CM | POA: Diagnosis not present

## 2022-06-09 DIAGNOSIS — M7989 Other specified soft tissue disorders: Secondary | ICD-10-CM | POA: Diagnosis not present

## 2022-06-09 DIAGNOSIS — W19XXXA Unspecified fall, initial encounter: Secondary | ICD-10-CM | POA: Diagnosis not present

## 2022-06-09 DIAGNOSIS — F0390 Unspecified dementia without behavioral disturbance: Secondary | ICD-10-CM | POA: Diagnosis not present

## 2022-06-09 DIAGNOSIS — I6789 Other cerebrovascular disease: Secondary | ICD-10-CM | POA: Diagnosis not present

## 2022-06-10 DIAGNOSIS — S61216A Laceration without foreign body of right little finger without damage to nail, initial encounter: Secondary | ICD-10-CM | POA: Diagnosis not present

## 2022-06-10 DIAGNOSIS — W19XXXA Unspecified fall, initial encounter: Secondary | ICD-10-CM | POA: Diagnosis not present

## 2022-06-10 DIAGNOSIS — I6789 Other cerebrovascular disease: Secondary | ICD-10-CM | POA: Diagnosis not present

## 2022-06-10 DIAGNOSIS — Z043 Encounter for examination and observation following other accident: Secondary | ICD-10-CM | POA: Diagnosis not present

## 2022-06-10 DIAGNOSIS — M170 Bilateral primary osteoarthritis of knee: Secondary | ICD-10-CM | POA: Diagnosis not present

## 2022-06-14 DIAGNOSIS — M5442 Lumbago with sciatica, left side: Secondary | ICD-10-CM | POA: Diagnosis not present

## 2022-06-14 DIAGNOSIS — R262 Difficulty in walking, not elsewhere classified: Secondary | ICD-10-CM | POA: Diagnosis not present

## 2022-06-14 DIAGNOSIS — N390 Urinary tract infection, site not specified: Secondary | ICD-10-CM | POA: Diagnosis not present

## 2022-06-14 DIAGNOSIS — M810 Age-related osteoporosis without current pathological fracture: Secondary | ICD-10-CM | POA: Diagnosis not present

## 2022-06-14 DIAGNOSIS — G8929 Other chronic pain: Secondary | ICD-10-CM | POA: Diagnosis not present

## 2022-06-14 DIAGNOSIS — M5441 Lumbago with sciatica, right side: Secondary | ICD-10-CM | POA: Diagnosis not present

## 2022-06-15 DIAGNOSIS — R399 Unspecified symptoms and signs involving the genitourinary system: Secondary | ICD-10-CM | POA: Diagnosis not present

## 2022-06-15 DIAGNOSIS — N390 Urinary tract infection, site not specified: Secondary | ICD-10-CM | POA: Diagnosis not present

## 2022-06-15 DIAGNOSIS — N3281 Overactive bladder: Secondary | ICD-10-CM | POA: Diagnosis not present

## 2022-06-17 DIAGNOSIS — R262 Difficulty in walking, not elsewhere classified: Secondary | ICD-10-CM | POA: Diagnosis not present

## 2022-06-17 DIAGNOSIS — S40929A Unspecified superficial injury of unspecified upper arm, initial encounter: Secondary | ICD-10-CM | POA: Diagnosis not present

## 2022-06-17 DIAGNOSIS — I10 Essential (primary) hypertension: Secondary | ICD-10-CM | POA: Diagnosis not present

## 2022-06-17 DIAGNOSIS — M5442 Lumbago with sciatica, left side: Secondary | ICD-10-CM | POA: Diagnosis not present

## 2022-06-17 DIAGNOSIS — S61216A Laceration without foreign body of right little finger without damage to nail, initial encounter: Secondary | ICD-10-CM | POA: Diagnosis not present

## 2022-06-17 DIAGNOSIS — N390 Urinary tract infection, site not specified: Secondary | ICD-10-CM | POA: Diagnosis not present

## 2022-06-17 DIAGNOSIS — M810 Age-related osteoporosis without current pathological fracture: Secondary | ICD-10-CM | POA: Diagnosis not present

## 2022-06-17 DIAGNOSIS — R4182 Altered mental status, unspecified: Secondary | ICD-10-CM | POA: Diagnosis not present

## 2022-06-17 DIAGNOSIS — G8929 Other chronic pain: Secondary | ICD-10-CM | POA: Diagnosis not present

## 2022-06-17 DIAGNOSIS — M5441 Lumbago with sciatica, right side: Secondary | ICD-10-CM | POA: Diagnosis not present

## 2022-06-21 DIAGNOSIS — M5441 Lumbago with sciatica, right side: Secondary | ICD-10-CM | POA: Diagnosis not present

## 2022-06-21 DIAGNOSIS — R262 Difficulty in walking, not elsewhere classified: Secondary | ICD-10-CM | POA: Diagnosis not present

## 2022-06-21 DIAGNOSIS — N390 Urinary tract infection, site not specified: Secondary | ICD-10-CM | POA: Diagnosis not present

## 2022-06-21 DIAGNOSIS — M810 Age-related osteoporosis without current pathological fracture: Secondary | ICD-10-CM | POA: Diagnosis not present

## 2022-06-21 DIAGNOSIS — M5442 Lumbago with sciatica, left side: Secondary | ICD-10-CM | POA: Diagnosis not present

## 2022-06-21 DIAGNOSIS — G8929 Other chronic pain: Secondary | ICD-10-CM | POA: Diagnosis not present

## 2022-06-22 ENCOUNTER — Ambulatory Visit (INDEPENDENT_AMBULATORY_CARE_PROVIDER_SITE_OTHER): Payer: Medicare Other | Admitting: Physician Assistant

## 2022-06-22 ENCOUNTER — Other Ambulatory Visit (INDEPENDENT_AMBULATORY_CARE_PROVIDER_SITE_OTHER): Payer: Medicare Other

## 2022-06-22 ENCOUNTER — Encounter: Payer: Self-pay | Admitting: Physician Assistant

## 2022-06-22 VITALS — BP 164/95 | HR 67 | Resp 20 | Ht 63.0 in | Wt 167.0 lb

## 2022-06-22 DIAGNOSIS — R262 Difficulty in walking, not elsewhere classified: Secondary | ICD-10-CM | POA: Diagnosis not present

## 2022-06-22 DIAGNOSIS — G8929 Other chronic pain: Secondary | ICD-10-CM | POA: Diagnosis not present

## 2022-06-22 DIAGNOSIS — M5442 Lumbago with sciatica, left side: Secondary | ICD-10-CM | POA: Diagnosis not present

## 2022-06-22 DIAGNOSIS — R413 Other amnesia: Secondary | ICD-10-CM

## 2022-06-22 DIAGNOSIS — N390 Urinary tract infection, site not specified: Secondary | ICD-10-CM | POA: Diagnosis not present

## 2022-06-22 DIAGNOSIS — M5441 Lumbago with sciatica, right side: Secondary | ICD-10-CM | POA: Diagnosis not present

## 2022-06-22 DIAGNOSIS — M810 Age-related osteoporosis without current pathological fracture: Secondary | ICD-10-CM | POA: Diagnosis not present

## 2022-06-22 LAB — TSH: TSH: 1.13 u[IU]/mL (ref 0.35–5.50)

## 2022-06-22 LAB — VITAMIN B12: Vitamin B-12: 620 pg/mL (ref 211–911)

## 2022-06-22 NOTE — Progress Notes (Signed)
B12 and thyroid levels are normal, thanks

## 2022-06-22 NOTE — Patient Instructions (Addendum)

## 2022-06-22 NOTE — Progress Notes (Signed)
Assessment/Plan:    The patient is seen in neurologic consultation at the request of Moon, Amy A, NP for the evaluation of memory.  Melinda Hartman is a very pleasant 82 y.o. year old RH female with a history of hypertension, hyperlipidemia, stage Ia left breast cancer on letrozole, recurrent UTIs, anxiety, depression, asthma, history of remote TIA in [place] (2010), anemia, vitamin D deficiency, vitamin B12 deficiency, arthritis with chronic low back pain, GERD polypharmacy including several pain medications, muscle relaxers, seen today for evaluation of memory loss. MoCA today is 13/30.  Findings are suspicious for memory impairment of unclear etiology, workup is in progress.  Due to physical mobility issues, her ADLs are limited.  Currently she is not driving.  Memory Impairment  MRI brain without contrast to assess for underlying structural abnormality and assess vascular load  Neurocognitive testing to further evaluate cognitive concerns and determine other underlying cause of memory changes, including potential contribution from sleep, anxiety, or depression  Recommend to discuss with PCP the need to use multiple pain medications, anti-inflammatories, and muscle relaxers, which significantly can affect memory.  Patient has significant amount of Tylenol intake (3 or 4 a day), which she takes for pain and headaches, and could potentially affect her liver functions.  Recommend to limit Tylenol to twice a week. Check B12, TSH Folllow up in 2 months  Subjective:    The patient is accompanied by her husband and daughter who supplement the history.    How long did patient have memory difficulties?  Patient reports that for the last year she has been noticing changes in her memory.  Her husband states that these changes have been worse since sustaining a mechanical fall in February 2024, hitting her face in the "left side of her head ".  She did not lose consciousness at that time.  She  reports not remembering people's names, even those that she knows, sometimes calling her husband "daddy".  having trouble finding things around the house, or remembering a conversation.  She continues to be "as active as I can ", enjoying yard work and other outdoor activities although not as often as before due to mobility issues.  She does not enjoy doing crossword puzzles, word finding.  She enjoys watching TV game shows.  repeats oneself?  Endorsed Disoriented when walking into a room?  Patient denies  Leaving objects in unusual places?   denies   Wandering behavior? denies   Any personality changes ? denies   Any history of depression?: Endorsed since 1972. She has severe GAD.  Hallucinations or paranoia? Endorsed.  "Sometimes she thinks that her parents are sleeping in her house and ask where they are ".  Seizures?  Patient denies, but husband reports that these may have been several decades ago, and only 1 episode, while in [place].  No recurrence. Any sleep changes?  Sleeps fairly well.  Denies vivid dreams, REM behavior or sleepwalking   Sleep apnea? denies   Any hygiene concerns?  denies   Independent of bathing and dressing?  Endorsed  Does the patient need help with medications?  Husband is in charge for the last 4 weeks as she was forgetting to take them  Who is in charge of the finances?  Husband  is in charge    Any changes in appetite?  Not as good as before. Drinks plenty water      Patient have trouble swallowing?  Had esophageal dilatation  Does the patient cook?  Not  right now. Forgets common recipes   Any kitchen accidents such as leaving the stove on? Patient denies   Any headaches? Since falling she has some headaches in the frontal area, worse in the morning. Takes tylenol 3-4/day and tramadol occasionally. Chronic back pain?  Endorsed, had major back surgery several times Ambulates with difficulty?  Endorsed, the patient has chronic gait disturbance, with pain in chronic  back pain and weakness.  She tried PT at home, and she is taking nonopioid and opioid analgesics.  She has had some recent falls, stating that "the legs give up on her ".  Of note, per chart report she has chronic low back pain with bilateral sciatica. Been seen by neurosurgery in the past.  Recent falls or head injuries? Endorsed. Denies LOC. "Legs gave out".  She has been doing physical therapy, and uses the walker. Vision changes? Denies  Unilateral weakness, numbness or tingling?  denies   Any tremors?  denies   Any anosmia?  denies   Any incontinence of urine?  She has a history of OAB, and chronic cystitis with decreased UO. Urology has followed in the past , but "she was in a lot of meds, she went to [facility] and placed her on antibiotics that did not work, then saw a PA in urogynecology, was on oxybutynin but had to discontinue it due to memory concerns, most recently tried Botox 3 weeks ago with nitrofurantoin.   Any bowel dysfunction? denies      Patient lives with her husband.    History of heavy alcohol intake? denies   History of heavy tobacco use? denies   Family history of dementia?   2 sisters  had dementia, one possibly with AD   Does patient drive? No   Allergies  Allergen Reactions   Caffeine Shortness Of Breath   Haloperidol Shortness Of Breath   Food     Allergy to seafood   Nicotine Other (See Comments)    asthma   Tape     Said it as a disappearing tape but not sure    Current Outpatient Medications  Medication Instructions   acetaminophen (TYLENOL) 325 mg, Oral, Every 6 hours PRN   acidophilus (RISAQUAD) CAPS capsule Oral, Daily   albuterol (PROVENTIL) 2.5 mg, Nebulization, Every 6 hours PRN   albuterol (VENTOLIN HFA) 108 (90 Base) MCG/ACT inhaler Inhalation, Every 6 hours PRN   aspirin EC 81 mg, Oral, Daily   celecoxib (CELEBREX) 100 mg, Oral, 2 times daily   cholecalciferol (VITAMIN D3) 1,000 Units, Oral, Daily   clonazePAM (KLONOPIN) 1 mg, Oral, 2 times daily    conjugated estrogens (PREMARIN) vaginal cream 1 Applicatorful, Vaginal, See admin instructions, 3 times a week   DULoxetine (CYMBALTA) 30 mg, Oral, Daily   EPINEPHrine (EPIPEN 2-PAK) 0.3 mg, Intramuscular, As needed   esomeprazole (NEXIUM) 40 mg, Oral, 2 times daily   ezetimibe (ZETIA) 10 mg, Oral, Daily   famotidine (PEPCID) 20 mg, Oral, 2 times daily   gabapentin (NEURONTIN) 600 mg, Oral, 2 times daily PRN   letrozole (FEMARA) 2.5 mg, Oral, Daily   methenamine (HIPREX) 1 g, Oral, 2 times daily with meals   montelukast (SINGULAIR) 10 mg, Oral, Daily   nitroGLYCERIN (NITROSTAT) 0.4 mg, Sublingual, Every 5 min PRN   oxybutynin (DITROPAN-XL) 5 mg, Oral, Daily   polyethylene glycol (MIRALAX / GLYCOLAX) 17 g, Oral, Daily PRN   potassium chloride SA (K-DUR,KLOR-CON) 20 MEQ tablet 20 mEq, Oral, Daily   traMADol (ULTRAM) 50 mg, Oral,  Every 12 hours PRN     VITALS:   Vitals:   06/22/22 0941  BP: (!) 164/95  Pulse: 67  Resp: 20  SpO2: 95%  Weight: 167 lb (75.8 kg)  Height: 5\' 3"  (1.6 m)       No data to display          PHYSICAL EXAM   HEENT:  Normocephalic, atraumatic. The mucous membranes are moist. The superficial temporal arteries are without ropiness or tenderness. Cardiovascular: Regular rate and rhythm. Lungs: Clear to auscultation bilaterally. Neck: There are no carotid bruits noted bilaterally.  NEUROLOGICAL:    06/22/2022   11:00 AM  Montreal Cognitive Assessment   Visuospatial/ Executive (0/5) 1  Naming (0/3) 3  Attention: Read list of digits (0/2) 2  Attention: Read list of letters (0/1) 1  Attention: Serial 7 subtraction starting at 100 (0/3) 0  Language: Repeat phrase (0/2) 0  Language : Fluency (0/1) 0  Abstraction (0/2) 1  Delayed Recall (0/5) 0  Orientation (0/6) 4  Total 12  Adjusted Score (based on education) 13        No data to display           Orientation:  Alert and oriented to person, place and not to time. No aphasia or dysarthria.  Fund of knowledge is appropriate. Recent and remote memory impaired.  Attention and concentration are reduced.  Able to name objects and unable to repeat phrases. Delayed recall 0/5 Cranial nerves: There is good facial symmetry. Extraocular muscles are intact and visual fields are full to confrontational testing. Speech is fluent and clear. no tongue deviation. Hearing is  slightly decreased to conversational tone uses hearing aids.  Tone: Tone is good throughout. Sensation: Sensation is intact to light touch and pinprick throughout. Vibration is intact at the bilateral big toe.There is no extinction with double simultaneous stimulation.   Coordination: The patient has no difficulty with RAM's or FNF bilaterally. Normal finger to nose  Motor: Strength is 5/5 in the bilateral upper and lower extremities. There is no pronator drift. There are no fasciculations noted. DTR's: Deep tendon reflexes are 2/4 at the bilateral biceps, triceps, brachioradialis, patella and achilles.  Plantar responses are downgoing bilaterally. Gait and Station: The patient is able to ambulate without difficulty with a walker.unable to walk in tandem fashion, unable to perform Romberg.  Gait is cautious and narrow, slow.   Thank you for allowing [place] the opportunity to participate in the care of this nice patient. Please do not hesitate to contact us for any questions or concerns.   Total time spent on today's visit was 60 minutes dedicated to this patient today, preparing to see patient, examining the patient, ordering tests and/or medications and counseling the patient, documenting clinical information in the EHR or other health record, independently interpreting results and communicating results to the patient/family, discussing treatment and goals, answering patient's questions and coordinating care.  Cc:  Hurshel Party, NP  Marlowe Kays 06/22/2022 11:27 AM

## 2022-06-24 DIAGNOSIS — R262 Difficulty in walking, not elsewhere classified: Secondary | ICD-10-CM | POA: Diagnosis not present

## 2022-06-24 DIAGNOSIS — S81812D Laceration without foreign body, left lower leg, subsequent encounter: Secondary | ICD-10-CM | POA: Diagnosis not present

## 2022-06-24 DIAGNOSIS — Z791 Long term (current) use of non-steroidal anti-inflammatories (NSAID): Secondary | ICD-10-CM | POA: Diagnosis not present

## 2022-06-24 DIAGNOSIS — N3281 Overactive bladder: Secondary | ICD-10-CM | POA: Diagnosis not present

## 2022-06-24 DIAGNOSIS — M5441 Lumbago with sciatica, right side: Secondary | ICD-10-CM | POA: Diagnosis not present

## 2022-06-24 DIAGNOSIS — K219 Gastro-esophageal reflux disease without esophagitis: Secondary | ICD-10-CM | POA: Diagnosis not present

## 2022-06-24 DIAGNOSIS — F321 Major depressive disorder, single episode, moderate: Secondary | ICD-10-CM | POA: Diagnosis not present

## 2022-06-24 DIAGNOSIS — Z7982 Long term (current) use of aspirin: Secondary | ICD-10-CM | POA: Diagnosis not present

## 2022-06-24 DIAGNOSIS — M1990 Unspecified osteoarthritis, unspecified site: Secondary | ICD-10-CM | POA: Diagnosis not present

## 2022-06-24 DIAGNOSIS — Z7951 Long term (current) use of inhaled steroids: Secondary | ICD-10-CM | POA: Diagnosis not present

## 2022-06-24 DIAGNOSIS — I10 Essential (primary) hypertension: Secondary | ICD-10-CM | POA: Diagnosis not present

## 2022-06-24 DIAGNOSIS — J4530 Mild persistent asthma, uncomplicated: Secondary | ICD-10-CM | POA: Diagnosis not present

## 2022-06-24 DIAGNOSIS — M5442 Lumbago with sciatica, left side: Secondary | ICD-10-CM | POA: Diagnosis not present

## 2022-06-24 DIAGNOSIS — Z9181 History of falling: Secondary | ICD-10-CM | POA: Diagnosis not present

## 2022-06-24 DIAGNOSIS — E559 Vitamin D deficiency, unspecified: Secondary | ICD-10-CM | POA: Diagnosis not present

## 2022-06-24 DIAGNOSIS — Z9841 Cataract extraction status, right eye: Secondary | ICD-10-CM | POA: Diagnosis not present

## 2022-06-24 DIAGNOSIS — D539 Nutritional anemia, unspecified: Secondary | ICD-10-CM | POA: Diagnosis not present

## 2022-06-24 DIAGNOSIS — M810 Age-related osteoporosis without current pathological fracture: Secondary | ICD-10-CM | POA: Diagnosis not present

## 2022-06-24 DIAGNOSIS — G4700 Insomnia, unspecified: Secondary | ICD-10-CM | POA: Diagnosis not present

## 2022-06-24 DIAGNOSIS — S0001XD Abrasion of scalp, subsequent encounter: Secondary | ICD-10-CM | POA: Diagnosis not present

## 2022-06-24 DIAGNOSIS — Z79899 Other long term (current) drug therapy: Secondary | ICD-10-CM | POA: Diagnosis not present

## 2022-06-24 DIAGNOSIS — Z9049 Acquired absence of other specified parts of digestive tract: Secondary | ICD-10-CM | POA: Diagnosis not present

## 2022-06-24 DIAGNOSIS — E785 Hyperlipidemia, unspecified: Secondary | ICD-10-CM | POA: Diagnosis not present

## 2022-06-24 DIAGNOSIS — N390 Urinary tract infection, site not specified: Secondary | ICD-10-CM | POA: Diagnosis not present

## 2022-06-24 DIAGNOSIS — G8929 Other chronic pain: Secondary | ICD-10-CM | POA: Diagnosis not present

## 2022-06-25 DIAGNOSIS — M810 Age-related osteoporosis without current pathological fracture: Secondary | ICD-10-CM | POA: Diagnosis not present

## 2022-06-25 DIAGNOSIS — N390 Urinary tract infection, site not specified: Secondary | ICD-10-CM | POA: Diagnosis not present

## 2022-06-25 DIAGNOSIS — R262 Difficulty in walking, not elsewhere classified: Secondary | ICD-10-CM | POA: Diagnosis not present

## 2022-06-25 DIAGNOSIS — M5442 Lumbago with sciatica, left side: Secondary | ICD-10-CM | POA: Diagnosis not present

## 2022-06-25 DIAGNOSIS — M5441 Lumbago with sciatica, right side: Secondary | ICD-10-CM | POA: Diagnosis not present

## 2022-06-25 DIAGNOSIS — G8929 Other chronic pain: Secondary | ICD-10-CM | POA: Diagnosis not present

## 2022-06-27 ENCOUNTER — Ambulatory Visit
Admission: RE | Admit: 2022-06-27 | Discharge: 2022-06-27 | Disposition: A | Discharge status: Home / Self Care | Payer: Medicare Other | Source: Ambulatory Visit | Attending: Physician Assistant | Admitting: Physician Assistant

## 2022-06-27 DIAGNOSIS — R410 Disorientation, unspecified: Secondary | ICD-10-CM | POA: Diagnosis not present

## 2022-06-27 DIAGNOSIS — R296 Repeated falls: Secondary | ICD-10-CM | POA: Diagnosis not present

## 2022-06-28 DIAGNOSIS — M5441 Lumbago with sciatica, right side: Secondary | ICD-10-CM | POA: Diagnosis not present

## 2022-06-28 DIAGNOSIS — R262 Difficulty in walking, not elsewhere classified: Secondary | ICD-10-CM | POA: Diagnosis not present

## 2022-06-28 DIAGNOSIS — G8929 Other chronic pain: Secondary | ICD-10-CM | POA: Diagnosis not present

## 2022-06-28 DIAGNOSIS — M5442 Lumbago with sciatica, left side: Secondary | ICD-10-CM | POA: Diagnosis not present

## 2022-06-28 DIAGNOSIS — M810 Age-related osteoporosis without current pathological fracture: Secondary | ICD-10-CM | POA: Diagnosis not present

## 2022-06-28 DIAGNOSIS — N390 Urinary tract infection, site not specified: Secondary | ICD-10-CM | POA: Diagnosis not present

## 2022-06-29 ENCOUNTER — Telehealth: Payer: Self-pay | Admitting: Physician Assistant

## 2022-06-29 NOTE — Telephone Encounter (Signed)
Mri results are not available yet,will call as soon as resulted

## 2022-06-29 NOTE — Telephone Encounter (Signed)
Pt's husband called in to get the pt's MRI results. He says she has been getting more mentally unstable and they are eager to hear something.

## 2022-07-01 DIAGNOSIS — N390 Urinary tract infection, site not specified: Secondary | ICD-10-CM | POA: Diagnosis not present

## 2022-07-01 DIAGNOSIS — G8929 Other chronic pain: Secondary | ICD-10-CM | POA: Diagnosis not present

## 2022-07-01 DIAGNOSIS — M810 Age-related osteoporosis without current pathological fracture: Secondary | ICD-10-CM | POA: Diagnosis not present

## 2022-07-01 DIAGNOSIS — M5442 Lumbago with sciatica, left side: Secondary | ICD-10-CM | POA: Diagnosis not present

## 2022-07-01 DIAGNOSIS — M5441 Lumbago with sciatica, right side: Secondary | ICD-10-CM | POA: Diagnosis not present

## 2022-07-01 DIAGNOSIS — R262 Difficulty in walking, not elsewhere classified: Secondary | ICD-10-CM | POA: Diagnosis not present

## 2022-07-02 NOTE — Telephone Encounter (Signed)
Called [facility] awaiting

## 2022-07-02 NOTE — Telephone Encounter (Signed)
Husband calling back on MRI test results

## 2022-07-04 NOTE — Progress Notes (Signed)
MTI brain shows changes in the circulation from aging vessels,and some age related atrophy that may explain the memory issues.  No evidence of an acute findings like a stroke, or bleeding or masses, ets. Make sure to monitor closely blood pressure, cholesterol, sugars, etc. Folllow up on these issues with primary doctor . thanks

## 2022-07-06 ENCOUNTER — Telehealth: Payer: Self-pay | Admitting: Physician Assistant

## 2022-07-06 DIAGNOSIS — R262 Difficulty in walking, not elsewhere classified: Secondary | ICD-10-CM | POA: Diagnosis not present

## 2022-07-06 DIAGNOSIS — M5442 Lumbago with sciatica, left side: Secondary | ICD-10-CM | POA: Diagnosis not present

## 2022-07-06 DIAGNOSIS — N390 Urinary tract infection, site not specified: Secondary | ICD-10-CM | POA: Diagnosis not present

## 2022-07-06 DIAGNOSIS — M810 Age-related osteoporosis without current pathological fracture: Secondary | ICD-10-CM | POA: Diagnosis not present

## 2022-07-06 DIAGNOSIS — M5441 Lumbago with sciatica, right side: Secondary | ICD-10-CM | POA: Diagnosis not present

## 2022-07-06 DIAGNOSIS — G8929 Other chronic pain: Secondary | ICD-10-CM | POA: Diagnosis not present

## 2022-07-06 NOTE — Telephone Encounter (Signed)
Patients husband states his wife had an MRI on her head. She is still having confusion. He was told by sara about a few medications that could help. He would like a call to discuss this.

## 2022-07-07 DIAGNOSIS — M810 Age-related osteoporosis without current pathological fracture: Secondary | ICD-10-CM | POA: Diagnosis not present

## 2022-07-07 DIAGNOSIS — N390 Urinary tract infection, site not specified: Secondary | ICD-10-CM | POA: Diagnosis not present

## 2022-07-07 DIAGNOSIS — M5441 Lumbago with sciatica, right side: Secondary | ICD-10-CM | POA: Diagnosis not present

## 2022-07-07 DIAGNOSIS — R262 Difficulty in walking, not elsewhere classified: Secondary | ICD-10-CM | POA: Diagnosis not present

## 2022-07-07 DIAGNOSIS — G8929 Other chronic pain: Secondary | ICD-10-CM | POA: Diagnosis not present

## 2022-07-07 DIAGNOSIS — M5442 Lumbago with sciatica, left side: Secondary | ICD-10-CM | POA: Diagnosis not present

## 2022-07-07 NOTE — Telephone Encounter (Signed)
Will reach out to PCP and will cut back on Tylenol intake

## 2022-07-08 DIAGNOSIS — S80922A Unspecified superficial injury of left lower leg, initial encounter: Secondary | ICD-10-CM | POA: Diagnosis not present

## 2022-07-08 DIAGNOSIS — R399 Unspecified symptoms and signs involving the genitourinary system: Secondary | ICD-10-CM | POA: Diagnosis not present

## 2022-07-08 DIAGNOSIS — R4182 Altered mental status, unspecified: Secondary | ICD-10-CM | POA: Diagnosis not present

## 2022-07-09 DIAGNOSIS — R262 Difficulty in walking, not elsewhere classified: Secondary | ICD-10-CM | POA: Diagnosis not present

## 2022-07-09 DIAGNOSIS — M5441 Lumbago with sciatica, right side: Secondary | ICD-10-CM | POA: Diagnosis not present

## 2022-07-09 DIAGNOSIS — N390 Urinary tract infection, site not specified: Secondary | ICD-10-CM | POA: Diagnosis not present

## 2022-07-09 DIAGNOSIS — M810 Age-related osteoporosis without current pathological fracture: Secondary | ICD-10-CM | POA: Diagnosis not present

## 2022-07-09 DIAGNOSIS — M5442 Lumbago with sciatica, left side: Secondary | ICD-10-CM | POA: Diagnosis not present

## 2022-07-09 DIAGNOSIS — G8929 Other chronic pain: Secondary | ICD-10-CM | POA: Diagnosis not present

## 2022-07-13 DIAGNOSIS — S81802A Unspecified open wound, left lower leg, initial encounter: Secondary | ICD-10-CM | POA: Diagnosis not present

## 2022-07-13 DIAGNOSIS — R262 Difficulty in walking, not elsewhere classified: Secondary | ICD-10-CM | POA: Diagnosis not present

## 2022-07-13 DIAGNOSIS — M5441 Lumbago with sciatica, right side: Secondary | ICD-10-CM | POA: Diagnosis not present

## 2022-07-13 DIAGNOSIS — M5442 Lumbago with sciatica, left side: Secondary | ICD-10-CM | POA: Diagnosis not present

## 2022-07-13 DIAGNOSIS — M810 Age-related osteoporosis without current pathological fracture: Secondary | ICD-10-CM | POA: Diagnosis not present

## 2022-07-13 DIAGNOSIS — N390 Urinary tract infection, site not specified: Secondary | ICD-10-CM | POA: Diagnosis not present

## 2022-07-13 DIAGNOSIS — G8929 Other chronic pain: Secondary | ICD-10-CM | POA: Diagnosis not present

## 2022-07-15 DIAGNOSIS — M5442 Lumbago with sciatica, left side: Secondary | ICD-10-CM | POA: Diagnosis not present

## 2022-07-15 DIAGNOSIS — R262 Difficulty in walking, not elsewhere classified: Secondary | ICD-10-CM | POA: Diagnosis not present

## 2022-07-15 DIAGNOSIS — M5441 Lumbago with sciatica, right side: Secondary | ICD-10-CM | POA: Diagnosis not present

## 2022-07-15 DIAGNOSIS — M810 Age-related osteoporosis without current pathological fracture: Secondary | ICD-10-CM | POA: Diagnosis not present

## 2022-07-15 DIAGNOSIS — G8929 Other chronic pain: Secondary | ICD-10-CM | POA: Diagnosis not present

## 2022-07-15 DIAGNOSIS — N390 Urinary tract infection, site not specified: Secondary | ICD-10-CM | POA: Diagnosis not present

## 2022-07-19 DIAGNOSIS — N390 Urinary tract infection, site not specified: Secondary | ICD-10-CM | POA: Diagnosis not present

## 2022-07-19 DIAGNOSIS — M810 Age-related osteoporosis without current pathological fracture: Secondary | ICD-10-CM | POA: Diagnosis not present

## 2022-07-19 DIAGNOSIS — M5441 Lumbago with sciatica, right side: Secondary | ICD-10-CM | POA: Diagnosis not present

## 2022-07-19 DIAGNOSIS — G8929 Other chronic pain: Secondary | ICD-10-CM | POA: Diagnosis not present

## 2022-07-19 DIAGNOSIS — R262 Difficulty in walking, not elsewhere classified: Secondary | ICD-10-CM | POA: Diagnosis not present

## 2022-07-19 DIAGNOSIS — M5442 Lumbago with sciatica, left side: Secondary | ICD-10-CM | POA: Diagnosis not present

## 2022-07-20 DIAGNOSIS — S81802D Unspecified open wound, left lower leg, subsequent encounter: Secondary | ICD-10-CM | POA: Diagnosis not present

## 2022-07-23 DIAGNOSIS — N390 Urinary tract infection, site not specified: Secondary | ICD-10-CM | POA: Diagnosis not present

## 2022-07-23 DIAGNOSIS — M5441 Lumbago with sciatica, right side: Secondary | ICD-10-CM | POA: Diagnosis not present

## 2022-07-23 DIAGNOSIS — M5442 Lumbago with sciatica, left side: Secondary | ICD-10-CM | POA: Diagnosis not present

## 2022-07-23 DIAGNOSIS — G8929 Other chronic pain: Secondary | ICD-10-CM | POA: Diagnosis not present

## 2022-07-23 DIAGNOSIS — R262 Difficulty in walking, not elsewhere classified: Secondary | ICD-10-CM | POA: Diagnosis not present

## 2022-07-23 DIAGNOSIS — M810 Age-related osteoporosis without current pathological fracture: Secondary | ICD-10-CM | POA: Diagnosis not present

## 2022-07-24 DIAGNOSIS — M5441 Lumbago with sciatica, right side: Secondary | ICD-10-CM | POA: Diagnosis not present

## 2022-07-24 DIAGNOSIS — Z79899 Other long term (current) drug therapy: Secondary | ICD-10-CM | POA: Diagnosis not present

## 2022-07-24 DIAGNOSIS — N3281 Overactive bladder: Secondary | ICD-10-CM | POA: Diagnosis not present

## 2022-07-24 DIAGNOSIS — Z9181 History of falling: Secondary | ICD-10-CM | POA: Diagnosis not present

## 2022-07-24 DIAGNOSIS — Z9842 Cataract extraction status, left eye: Secondary | ICD-10-CM | POA: Diagnosis not present

## 2022-07-24 DIAGNOSIS — G8929 Other chronic pain: Secondary | ICD-10-CM | POA: Diagnosis not present

## 2022-07-24 DIAGNOSIS — Z87440 Personal history of urinary (tract) infections: Secondary | ICD-10-CM | POA: Diagnosis not present

## 2022-07-24 DIAGNOSIS — R262 Difficulty in walking, not elsewhere classified: Secondary | ICD-10-CM | POA: Diagnosis not present

## 2022-07-24 DIAGNOSIS — J4530 Mild persistent asthma, uncomplicated: Secondary | ICD-10-CM | POA: Diagnosis not present

## 2022-07-24 DIAGNOSIS — Z9049 Acquired absence of other specified parts of digestive tract: Secondary | ICD-10-CM | POA: Diagnosis not present

## 2022-07-24 DIAGNOSIS — E785 Hyperlipidemia, unspecified: Secondary | ICD-10-CM | POA: Diagnosis not present

## 2022-07-24 DIAGNOSIS — M1990 Unspecified osteoarthritis, unspecified site: Secondary | ICD-10-CM | POA: Diagnosis not present

## 2022-07-24 DIAGNOSIS — Z791 Long term (current) use of non-steroidal anti-inflammatories (NSAID): Secondary | ICD-10-CM | POA: Diagnosis not present

## 2022-07-24 DIAGNOSIS — Z7982 Long term (current) use of aspirin: Secondary | ICD-10-CM | POA: Diagnosis not present

## 2022-07-24 DIAGNOSIS — M810 Age-related osteoporosis without current pathological fracture: Secondary | ICD-10-CM | POA: Diagnosis not present

## 2022-07-24 DIAGNOSIS — I10 Essential (primary) hypertension: Secondary | ICD-10-CM | POA: Diagnosis not present

## 2022-07-24 DIAGNOSIS — M5442 Lumbago with sciatica, left side: Secondary | ICD-10-CM | POA: Diagnosis not present

## 2022-07-24 DIAGNOSIS — K219 Gastro-esophageal reflux disease without esophagitis: Secondary | ICD-10-CM | POA: Diagnosis not present

## 2022-07-24 DIAGNOSIS — E559 Vitamin D deficiency, unspecified: Secondary | ICD-10-CM | POA: Diagnosis not present

## 2022-07-24 DIAGNOSIS — F321 Major depressive disorder, single episode, moderate: Secondary | ICD-10-CM | POA: Diagnosis not present

## 2022-07-24 DIAGNOSIS — D539 Nutritional anemia, unspecified: Secondary | ICD-10-CM | POA: Diagnosis not present

## 2022-07-24 DIAGNOSIS — S81802D Unspecified open wound, left lower leg, subsequent encounter: Secondary | ICD-10-CM | POA: Diagnosis not present

## 2022-07-24 DIAGNOSIS — G4700 Insomnia, unspecified: Secondary | ICD-10-CM | POA: Diagnosis not present

## 2022-07-24 DIAGNOSIS — Z9841 Cataract extraction status, right eye: Secondary | ICD-10-CM | POA: Diagnosis not present

## 2022-07-24 DIAGNOSIS — Z7951 Long term (current) use of inhaled steroids: Secondary | ICD-10-CM | POA: Diagnosis not present

## 2022-07-26 DIAGNOSIS — F419 Anxiety disorder, unspecified: Secondary | ICD-10-CM | POA: Diagnosis not present

## 2022-07-26 DIAGNOSIS — R399 Unspecified symptoms and signs involving the genitourinary system: Secondary | ICD-10-CM | POA: Diagnosis not present

## 2022-07-27 DIAGNOSIS — R262 Difficulty in walking, not elsewhere classified: Secondary | ICD-10-CM | POA: Diagnosis not present

## 2022-07-27 DIAGNOSIS — Z87440 Personal history of urinary (tract) infections: Secondary | ICD-10-CM | POA: Diagnosis not present

## 2022-07-27 DIAGNOSIS — G8929 Other chronic pain: Secondary | ICD-10-CM | POA: Diagnosis not present

## 2022-07-27 DIAGNOSIS — M5441 Lumbago with sciatica, right side: Secondary | ICD-10-CM | POA: Diagnosis not present

## 2022-07-27 DIAGNOSIS — M1990 Unspecified osteoarthritis, unspecified site: Secondary | ICD-10-CM | POA: Diagnosis not present

## 2022-07-27 DIAGNOSIS — M5442 Lumbago with sciatica, left side: Secondary | ICD-10-CM | POA: Diagnosis not present

## 2022-07-28 DIAGNOSIS — Z853 Personal history of malignant neoplasm of breast: Secondary | ICD-10-CM | POA: Diagnosis not present

## 2022-07-28 DIAGNOSIS — R928 Other abnormal and inconclusive findings on diagnostic imaging of breast: Secondary | ICD-10-CM | POA: Diagnosis not present

## 2022-07-30 DIAGNOSIS — F419 Anxiety disorder, unspecified: Secondary | ICD-10-CM | POA: Diagnosis not present

## 2022-07-30 DIAGNOSIS — G8929 Other chronic pain: Secondary | ICD-10-CM | POA: Diagnosis not present

## 2022-07-30 DIAGNOSIS — J4530 Mild persistent asthma, uncomplicated: Secondary | ICD-10-CM | POA: Diagnosis not present

## 2022-07-30 DIAGNOSIS — Z87440 Personal history of urinary (tract) infections: Secondary | ICD-10-CM | POA: Diagnosis not present

## 2022-07-30 DIAGNOSIS — D539 Nutritional anemia, unspecified: Secondary | ICD-10-CM | POA: Diagnosis not present

## 2022-07-30 DIAGNOSIS — R262 Difficulty in walking, not elsewhere classified: Secondary | ICD-10-CM | POA: Diagnosis not present

## 2022-07-30 DIAGNOSIS — E785 Hyperlipidemia, unspecified: Secondary | ICD-10-CM | POA: Diagnosis not present

## 2022-07-30 DIAGNOSIS — M1990 Unspecified osteoarthritis, unspecified site: Secondary | ICD-10-CM | POA: Diagnosis not present

## 2022-07-30 DIAGNOSIS — E538 Deficiency of other specified B group vitamins: Secondary | ICD-10-CM | POA: Diagnosis not present

## 2022-07-30 DIAGNOSIS — M810 Age-related osteoporosis without current pathological fracture: Secondary | ICD-10-CM | POA: Diagnosis not present

## 2022-07-30 DIAGNOSIS — M5442 Lumbago with sciatica, left side: Secondary | ICD-10-CM | POA: Diagnosis not present

## 2022-07-30 DIAGNOSIS — F321 Major depressive disorder, single episode, moderate: Secondary | ICD-10-CM | POA: Diagnosis not present

## 2022-07-30 DIAGNOSIS — R739 Hyperglycemia, unspecified: Secondary | ICD-10-CM | POA: Diagnosis not present

## 2022-07-30 DIAGNOSIS — I10 Essential (primary) hypertension: Secondary | ICD-10-CM | POA: Diagnosis not present

## 2022-07-30 DIAGNOSIS — E559 Vitamin D deficiency, unspecified: Secondary | ICD-10-CM | POA: Diagnosis not present

## 2022-07-30 DIAGNOSIS — M5441 Lumbago with sciatica, right side: Secondary | ICD-10-CM | POA: Diagnosis not present

## 2022-08-02 DIAGNOSIS — R262 Difficulty in walking, not elsewhere classified: Secondary | ICD-10-CM | POA: Diagnosis not present

## 2022-08-02 DIAGNOSIS — M5442 Lumbago with sciatica, left side: Secondary | ICD-10-CM | POA: Diagnosis not present

## 2022-08-02 DIAGNOSIS — M5441 Lumbago with sciatica, right side: Secondary | ICD-10-CM | POA: Diagnosis not present

## 2022-08-02 DIAGNOSIS — M1990 Unspecified osteoarthritis, unspecified site: Secondary | ICD-10-CM | POA: Diagnosis not present

## 2022-08-02 DIAGNOSIS — G8929 Other chronic pain: Secondary | ICD-10-CM | POA: Diagnosis not present

## 2022-08-02 DIAGNOSIS — Z87440 Personal history of urinary (tract) infections: Secondary | ICD-10-CM | POA: Diagnosis not present

## 2022-08-05 DIAGNOSIS — R3915 Urgency of urination: Secondary | ICD-10-CM | POA: Diagnosis not present

## 2022-08-05 DIAGNOSIS — R339 Retention of urine, unspecified: Secondary | ICD-10-CM | POA: Diagnosis not present

## 2022-08-05 DIAGNOSIS — R350 Frequency of micturition: Secondary | ICD-10-CM | POA: Diagnosis not present

## 2022-08-05 DIAGNOSIS — R300 Dysuria: Secondary | ICD-10-CM | POA: Diagnosis not present

## 2022-08-06 DIAGNOSIS — Z87440 Personal history of urinary (tract) infections: Secondary | ICD-10-CM | POA: Diagnosis not present

## 2022-08-06 DIAGNOSIS — M5442 Lumbago with sciatica, left side: Secondary | ICD-10-CM | POA: Diagnosis not present

## 2022-08-06 DIAGNOSIS — G8929 Other chronic pain: Secondary | ICD-10-CM | POA: Diagnosis not present

## 2022-08-06 DIAGNOSIS — M1990 Unspecified osteoarthritis, unspecified site: Secondary | ICD-10-CM | POA: Diagnosis not present

## 2022-08-06 DIAGNOSIS — M5441 Lumbago with sciatica, right side: Secondary | ICD-10-CM | POA: Diagnosis not present

## 2022-08-06 DIAGNOSIS — R262 Difficulty in walking, not elsewhere classified: Secondary | ICD-10-CM | POA: Diagnosis not present

## 2022-08-10 DIAGNOSIS — N390 Urinary tract infection, site not specified: Secondary | ICD-10-CM | POA: Diagnosis not present

## 2022-08-10 DIAGNOSIS — N3281 Overactive bladder: Secondary | ICD-10-CM | POA: Diagnosis not present

## 2022-08-10 DIAGNOSIS — R300 Dysuria: Secondary | ICD-10-CM | POA: Diagnosis not present

## 2022-08-11 DIAGNOSIS — S81802D Unspecified open wound, left lower leg, subsequent encounter: Secondary | ICD-10-CM | POA: Diagnosis not present

## 2022-08-11 DIAGNOSIS — C50212 Malignant neoplasm of upper-inner quadrant of left female breast: Secondary | ICD-10-CM | POA: Diagnosis not present

## 2022-08-11 DIAGNOSIS — Z170 Estrogen receptor positive status [ER+]: Secondary | ICD-10-CM | POA: Diagnosis not present

## 2022-08-12 DIAGNOSIS — Z87440 Personal history of urinary (tract) infections: Secondary | ICD-10-CM | POA: Diagnosis not present

## 2022-08-12 DIAGNOSIS — M5442 Lumbago with sciatica, left side: Secondary | ICD-10-CM | POA: Diagnosis not present

## 2022-08-12 DIAGNOSIS — M1990 Unspecified osteoarthritis, unspecified site: Secondary | ICD-10-CM | POA: Diagnosis not present

## 2022-08-12 DIAGNOSIS — M5441 Lumbago with sciatica, right side: Secondary | ICD-10-CM | POA: Diagnosis not present

## 2022-08-12 DIAGNOSIS — R262 Difficulty in walking, not elsewhere classified: Secondary | ICD-10-CM | POA: Diagnosis not present

## 2022-08-12 DIAGNOSIS — G8929 Other chronic pain: Secondary | ICD-10-CM | POA: Diagnosis not present

## 2022-08-13 DIAGNOSIS — M5442 Lumbago with sciatica, left side: Secondary | ICD-10-CM | POA: Diagnosis not present

## 2022-08-13 DIAGNOSIS — G8929 Other chronic pain: Secondary | ICD-10-CM | POA: Diagnosis not present

## 2022-08-13 DIAGNOSIS — Z87440 Personal history of urinary (tract) infections: Secondary | ICD-10-CM | POA: Diagnosis not present

## 2022-08-13 DIAGNOSIS — M5441 Lumbago with sciatica, right side: Secondary | ICD-10-CM | POA: Diagnosis not present

## 2022-08-13 DIAGNOSIS — M1990 Unspecified osteoarthritis, unspecified site: Secondary | ICD-10-CM | POA: Diagnosis not present

## 2022-08-13 DIAGNOSIS — R262 Difficulty in walking, not elsewhere classified: Secondary | ICD-10-CM | POA: Diagnosis not present

## 2022-08-18 DIAGNOSIS — Z87440 Personal history of urinary (tract) infections: Secondary | ICD-10-CM | POA: Diagnosis not present

## 2022-08-18 DIAGNOSIS — M1990 Unspecified osteoarthritis, unspecified site: Secondary | ICD-10-CM | POA: Diagnosis not present

## 2022-08-18 DIAGNOSIS — G8929 Other chronic pain: Secondary | ICD-10-CM | POA: Diagnosis not present

## 2022-08-18 DIAGNOSIS — M5442 Lumbago with sciatica, left side: Secondary | ICD-10-CM | POA: Diagnosis not present

## 2022-08-18 DIAGNOSIS — R262 Difficulty in walking, not elsewhere classified: Secondary | ICD-10-CM | POA: Diagnosis not present

## 2022-08-18 DIAGNOSIS — M5441 Lumbago with sciatica, right side: Secondary | ICD-10-CM | POA: Diagnosis not present

## 2022-08-23 ENCOUNTER — Encounter: Payer: Self-pay | Admitting: Physician Assistant

## 2022-08-23 ENCOUNTER — Ambulatory Visit (INDEPENDENT_AMBULATORY_CARE_PROVIDER_SITE_OTHER): Payer: Medicare Other | Admitting: Physician Assistant

## 2022-08-23 ENCOUNTER — Other Ambulatory Visit: Payer: Self-pay | Admitting: Oncology

## 2022-08-23 VITALS — BP 149/81 | HR 78 | Resp 18 | Ht 63.0 in | Wt 169.0 lb

## 2022-08-23 DIAGNOSIS — G4700 Insomnia, unspecified: Secondary | ICD-10-CM | POA: Diagnosis not present

## 2022-08-23 DIAGNOSIS — M5441 Lumbago with sciatica, right side: Secondary | ICD-10-CM | POA: Diagnosis not present

## 2022-08-23 DIAGNOSIS — Z9181 History of falling: Secondary | ICD-10-CM | POA: Diagnosis not present

## 2022-08-23 DIAGNOSIS — N3281 Overactive bladder: Secondary | ICD-10-CM | POA: Diagnosis not present

## 2022-08-23 DIAGNOSIS — E559 Vitamin D deficiency, unspecified: Secondary | ICD-10-CM | POA: Diagnosis not present

## 2022-08-23 DIAGNOSIS — G8929 Other chronic pain: Secondary | ICD-10-CM | POA: Diagnosis not present

## 2022-08-23 DIAGNOSIS — Z9841 Cataract extraction status, right eye: Secondary | ICD-10-CM | POA: Diagnosis not present

## 2022-08-23 DIAGNOSIS — Z7982 Long term (current) use of aspirin: Secondary | ICD-10-CM | POA: Diagnosis not present

## 2022-08-23 DIAGNOSIS — R413 Other amnesia: Secondary | ICD-10-CM | POA: Diagnosis not present

## 2022-08-23 DIAGNOSIS — Z79899 Other long term (current) drug therapy: Secondary | ICD-10-CM | POA: Diagnosis not present

## 2022-08-23 DIAGNOSIS — S81802D Unspecified open wound, left lower leg, subsequent encounter: Secondary | ICD-10-CM | POA: Diagnosis not present

## 2022-08-23 DIAGNOSIS — Z9842 Cataract extraction status, left eye: Secondary | ICD-10-CM | POA: Diagnosis not present

## 2022-08-23 DIAGNOSIS — R262 Difficulty in walking, not elsewhere classified: Secondary | ICD-10-CM | POA: Diagnosis not present

## 2022-08-23 DIAGNOSIS — Z791 Long term (current) use of non-steroidal anti-inflammatories (NSAID): Secondary | ICD-10-CM | POA: Diagnosis not present

## 2022-08-23 DIAGNOSIS — F321 Major depressive disorder, single episode, moderate: Secondary | ICD-10-CM | POA: Diagnosis not present

## 2022-08-23 DIAGNOSIS — Z7951 Long term (current) use of inhaled steroids: Secondary | ICD-10-CM | POA: Diagnosis not present

## 2022-08-23 DIAGNOSIS — Z9049 Acquired absence of other specified parts of digestive tract: Secondary | ICD-10-CM | POA: Diagnosis not present

## 2022-08-23 DIAGNOSIS — M1990 Unspecified osteoarthritis, unspecified site: Secondary | ICD-10-CM | POA: Diagnosis not present

## 2022-08-23 DIAGNOSIS — Z87440 Personal history of urinary (tract) infections: Secondary | ICD-10-CM | POA: Diagnosis not present

## 2022-08-23 DIAGNOSIS — J4530 Mild persistent asthma, uncomplicated: Secondary | ICD-10-CM | POA: Diagnosis not present

## 2022-08-23 DIAGNOSIS — E785 Hyperlipidemia, unspecified: Secondary | ICD-10-CM | POA: Diagnosis not present

## 2022-08-23 DIAGNOSIS — I10 Essential (primary) hypertension: Secondary | ICD-10-CM | POA: Diagnosis not present

## 2022-08-23 DIAGNOSIS — M810 Age-related osteoporosis without current pathological fracture: Secondary | ICD-10-CM | POA: Diagnosis not present

## 2022-08-23 DIAGNOSIS — K219 Gastro-esophageal reflux disease without esophagitis: Secondary | ICD-10-CM | POA: Diagnosis not present

## 2022-08-23 DIAGNOSIS — D539 Nutritional anemia, unspecified: Secondary | ICD-10-CM | POA: Diagnosis not present

## 2022-08-23 DIAGNOSIS — M5442 Lumbago with sciatica, left side: Secondary | ICD-10-CM | POA: Diagnosis not present

## 2022-08-23 MED ORDER — MEMANTINE HCL 5 MG PO TABS: ORAL_TABLET | ORAL | 11 refills | Status: DC

## 2022-08-23 MED ORDER — DONEPEZIL HCL 10 MG PO TABS: ORAL_TABLET | ORAL | 11 refills | Status: DC

## 2022-08-23 NOTE — Progress Notes (Signed)
Assessment/Plan:      Dementia, unclear etiology concern for vascular and Alzheimer's disease.    Melinda Hartman is a very pleasant 82 y.o. RH female hypertension, hyperlipidemia, stage Ia left breast cancer on letrozole, recurrent UTIs, anxiety, depression, asthma, history of remote TIA in [place] (2010), anemia, vitamin D deficiency, vitamin B12 deficiency, arthritis with chronic low back pain, GERD, polypharmacy including several pain medications, muscle relaxers seen today in follow up to discuss the MRI of the brain results. These were personally reviewed, remarkable for advanced chronic small vessel ischemic disease within the cerebral white matter, as well as chronic small vessel disease and changes in the basal ganglia and pons as well as mild to moderate for age generalized cerebral atrophy, no acute findings.  She was last seen on 06/22/2022, with a MoCA of 13/30.  This patient is accompanied in the office by her husband and daughter who supplements the history.  Previous records as well as any outside records available were reviewed prior to todays visit.    Recommendations Follow up in  6  months. Recommend good control of cardiovascular risk factorsding to discuss with PCP the need for multiple pain  Patient is scheduled for repeat neuropsych evaluation in our office on March 2025 for clarity of the diagnosis and disease trajectory Discussed polypharmacy with PCP, the patient is on multiple medications including  anti-inflammatory, muscle relaxers. Start memantine 5 mg twice daily, side effects discussed (if patient has chronic leg cramps and chronic diarrhea)   Initial visit 06/22/2022 How long did patient have memory difficulties?  Patient reports that for the last year she has been noticing changes in her memory.  Her husband states that these changes have been worse since sustaining a mechanical fall in February 2024, hitting her face in the "left side of her head ".  She did  not lose consciousness at that time.  She reports not remembering people's names, even those that she knows, sometimes calling her husband "daddy".  having trouble finding things around the house, or remembering a conversation.  She continues to be "as active as I can ", enjoying yard work and other outdoor activities although not as often as before due to mobility issues.  She does not enjoy doing crossword puzzles, word finding.  She enjoys watching TV game shows.  repeats oneself?  Endorsed Disoriented when walking into a room?  Patient denies  Leaving objects in unusual places?   denies   Wandering behavior? denies   Any personality changes ? denies   Any history of depression?: Endorsed since 1972. She has severe GAD.  Hallucinations or paranoia? Endorsed.  "Sometimes she thinks that her parents are sleeping in her house and ask where they are ".  Seizures?  Patient denies, but husband reports that these may have been several decades ago, and only 1 episode, while in [place].  No recurrence. Any sleep changes?  Sleeps fairly well.  Denies vivid dreams, REM behavior or sleepwalking   Sleep apnea? denies   Any hygiene concerns?  denies   Independent of bathing and dressing?  Endorsed  Does the patient need help with medications?  Husband is in charge for the last 4 weeks as she was forgetting to take them  Who is in charge of the finances?  Husband  is in charge    Any changes in appetite?  Not as good as before. Drinks plenty water      Patient have trouble swallowing?  Had esophageal dilatation  Does the patient cook?  Not right now. Forgets common recipes   Any kitchen accidents such as leaving the stove on? Patient denies   Any headaches? Since falling she has some headaches in the frontal area, worse in the morning. Takes tylenol 3-4/day and tramadol occasionally. Chronic back pain?  Endorsed, had major back surgery several times Ambulates with difficulty?  Endorsed, the patient has  chronic gait disturbance, with pain in chronic back pain and weakness.  She tried PT at home, and she is taking nonopioid and opioid analgesics.  She has had some recent falls, stating that "the legs give up on her ".  Of note, per chart report she has chronic low back pain with bilateral sciatica. Been seen by neurosurgery in the past.  Recent falls or head injuries? Endorsed. Denies LOC. "Legs gave out".  She has been doing physical therapy, and uses the walker. Vision changes? Denies  Unilateral weakness, numbness or tingling?  denies   Any tremors?  denies   Any anosmia?  denies   Any incontinence of urine?  She has a history of OAB, and chronic cystitis with decreased UO. Urology has followed in the past , but "she was in a lot of meds, she went to [facility] and placed her on antibiotics that did not work, then saw a PA in urogynecology, was on oxybutynin but had to discontinue it due to memory concerns, most recently tried Botox 3 weeks ago with nitrofurantoin.   Any bowel dysfunction? denies      Patient lives with her husband.    History of heavy alcohol intake? denies   History of heavy tobacco use? denies   Family history of dementia?   2 sisters  had dementia, one possibly with AD   Does patient drive? No     CURRENT MEDICATIONS:  Outpatient Encounter Medications as of 08/23/2022  Medication Sig   acetaminophen (TYLENOL) 325 MG tablet Take 325 mg by mouth every 6 (six) hours as needed for moderate pain.    acidophilus (RISAQUAD) CAPS capsule Take by mouth daily.   albuterol (PROVENTIL) (2.5 MG/3ML) 0.083% nebulizer solution Take 2.5 mg by nebulization every 6 (six) hours as needed.   albuterol (VENTOLIN HFA) 108 (90 Base) MCG/ACT inhaler Inhale into the lungs every 6 (six) hours as needed.   aspirin EC 81 MG tablet Take 81 mg by mouth daily.    celecoxib (CELEBREX) 100 MG capsule Take 100 mg by mouth 2 (two) times daily.   cholecalciferol (VITAMIN D3) 25 MCG (1000 UNIT) tablet Take 1,000  Units by mouth daily.   clonazePAM (KLONOPIN) 0.5 MG tablet Take 1 mg by mouth 2 (two) times daily.    conjugated estrogens (PREMARIN) vaginal cream Place 1 Applicatorful vaginally See admin instructions. 3 times a week   DULoxetine (CYMBALTA) 30 MG capsule Take 30 mg by mouth daily.   EPINEPHrine (EPIPEN 2-PAK) 0.3 mg/0.3 mL IJ SOAJ injection Inject 0.3 mg into the muscle as needed for anaphylaxis.    esomeprazole (NEXIUM) 40 MG capsule Take 40 mg by mouth 2 (two) times daily.   ezetimibe (ZETIA) 10 MG tablet Take 10 mg by mouth daily.   famotidine (PEPCID) 20 MG tablet Take 20 mg by mouth 2 (two) times daily.   gabapentin (NEURONTIN) 300 MG capsule Take 600 mg by mouth 2 (two) times daily as needed (sometimes 3 times daily).   letrozole (FEMARA) 2.5 MG tablet Take 1 tablet (2.5 mg total) by mouth daily.   methenamine (HIPREX) 1  g tablet Take 1 g by mouth 2 (two) times daily with a meal.   montelukast (SINGULAIR) 10 MG tablet Take 10 mg by mouth daily.    nitroGLYCERIN (NITROSTAT) 0.4 MG SL tablet Place 0.4 mg under the tongue every 5 (five) minutes as needed for chest pain.    oxybutynin (DITROPAN-XL) 5 MG 24 hr tablet Take 5 mg by mouth daily.   polyethylene glycol (MIRALAX / GLYCOLAX) packet Take 17 g by mouth daily as needed for mild constipation.    potassium chloride SA (K-DUR,KLOR-CON) 20 MEQ tablet Take 20 mEq by mouth daily.    traMADol (ULTRAM) 50 MG tablet Take 50 mg by mouth every 12 (twelve) hours as needed for moderate pain.    No facility-administered encounter medications on file as of 08/23/2022.        No data to display            06/22/2022   11:00 AM  Montreal Cognitive Assessment   Visuospatial/ Executive (0/5) 1  Naming (0/3) 3  Attention: Read list of digits (0/2) 2  Attention: Read list of letters (0/1) 1  Attention: Serial 7 subtraction starting at 100 (0/3) 0  Language: Repeat phrase (0/2) 0  Language : Fluency (0/1) 0  Abstraction (0/2) 1  Delayed  Recall (0/5) 0  Orientation (0/6) 4  Total 12  Adjusted Score (based on education) 13   Thank you for allowing [place] the opportunity to participate in the care of this nice patient. Please do not hesitate to contact us for any questions or concerns.   Total time spent on today's visit was 35 minutes dedicated to this patient today, preparing to see patient, examining the patient, ordering tests and/or medications and counseling the patient, documenting clinical information in the EHR or other health record, independently interpreting results and communicating results to the patient/family, discussing treatment and goals, answering patient's questions and coordinating care.  Cc:  Hurshel Party, NP  Marlowe Kays 08/23/2022 6:35 AM

## 2022-08-23 NOTE — Patient Instructions (Addendum)
It was a pleasure to see you today at our office.   Recommendations:  Neurocognitive evaluation at our office in March 2025   Start Memantine  5mg : Take 1 tablet (5  mg at night) for 2 weeks, then increase to 1 tablet (5  mg) twice a day.    Consider going over the medicines with your primary doctor the ones that can affect your memory Follow up in  6  months     For assessment of decision of mental capacity and competency:  Call Dr. Erick Blinks, geriatric psychiatrist at [phone] Counseling regarding caregiver distress, including caregiver depression, anxiety and issues regarding community resources, adult day care programs, adult living facilities, or memory care questions:  please contact your  Primary Doctor's Social Worker  Whom to call: Memory  decline, memory medications: Call our office [phone]  For psychiatric meds, mood meds: Please have your primary care physician manage these medications.  If you have any severe symptoms of a stroke, or other severe issues such as confusion,severe chills or fever, etc call 911 or go to the ER as you may need to be evaluated further    RECOMMENDATIONS FOR ALL PATIENTS WITH MEMORY PROBLEMS: 1. Continue to exercise (Recommend 30 minutes of walking everyday, or 3 hours every week) 2. Increase social interactions - continue going to [place] and enjoy social gatherings with friends and family 3. Eat healthy, avoid fried foods and eat more fruits and vegetables 4. Maintain adequate blood pressure, blood sugar, and blood cholesterol level. Reducing the risk of stroke and cardiovascular disease also helps promoting better memory. 5. Avoid stressful situations. Live a simple life and avoid aggravations. Organize your time and prepare for the next day in anticipation. 6. Sleep well, avoid any interruptions of sleep and avoid any distractions in the bedroom that may interfere with adequate sleep quality 7. Avoid sugar, avoid sweets as there is a  strong link between excessive sugar intake, diabetes, and cognitive impairment We discussed the Mediterranean diet, which has been shown to help patients reduce the risk of progressive memory disorders and reduces cardiovascular risk. This includes eating fish, eat fruits and green leafy vegetables, nuts like almonds and hazelnuts, walnuts, and also use olive oil. Avoid fast foods and fried foods as much as possible. Avoid sweets and sugar as sugar use has been linked to worsening of memory function.  There is always a concern of gradual progression of memory problems. If this is the case, then we may need to adjust level of care according to patient needs. Support, both to the patient and caregiver, should then be put into place.      You have been referred for a neuropsychological evaluation (i.e., evaluation of memory and thinking abilities). Please bring someone with you to this appointment if possible, as it is helpful for the doctor to hear from both you and another adult who knows you well. Please bring eyeglasses and hearing aids if you wear them.    The evaluation will take approximately 3 hours and has two parts:   The first part is a clinical interview with the neuropsychologist (Dr. Milbert Coulter or Dr. Roseanne Reno). During the interview, the neuropsychologist will speak with you and the individual you brought to the appointment.    The second part of the evaluation is testing with the doctor's technician Annabelle Harman or Selena Batten). During the testing, the technician will ask you to remember different types of material, solve problems, and answer some questionnaires. Your family member will  not be present for this portion of the evaluation.   Please note: We must reserve several hours of the neuropsychologist's time and the psychometrician's time for your evaluation appointment. As such, there is a No-Show fee of $100. If you are unable to attend any of your appointments, please contact our office as soon as  possible to reschedule.    FALL PRECAUTIONS: Be cautious when walking. Scan the area for obstacles that may increase the risk of trips and falls. When getting up in the mornings, sit up at the edge of the bed for a few minutes before getting out of bed. Consider elevating the bed at the head end to avoid drop of blood pressure when getting up. Walk always in a well-lit room (use night lights in the walls). Avoid area rugs or power cords from appliances in the middle of the walkways. Use a walker or a cane if necessary and consider physical therapy for balance exercise. Get your eyesight checked regularly.  FINANCIAL OVERSIGHT: Supervision, especially oversight when making financial decisions or transactions is also recommended.  HOME SAFETY: Consider the safety of the kitchen when operating appliances like stoves, microwave oven, and blender. Consider having supervision and share cooking responsibilities until no longer able to participate in those. Accidents with firearms and other hazards in the house should be identified and addressed as well.   ABILITY TO BE LEFT ALONE: If patient is unable to contact 911 operator, consider using LifeLine, or when the need is there, arrange for someone to stay with patients. Smoking is a fire hazard, consider supervision or cessation. Risk of wandering should be assessed by caregiver and if detected at any point, supervision and safe proof recommendations should be instituted.  MEDICATION SUPERVISION: Inability to self-administer medication needs to be constantly addressed. Implement a mechanism to ensure safe administration of the medications.   DRIVING: Regarding driving, in patients with progressive memory problems, driving will be impaired. We advise to have someone else do the driving if trouble finding directions or if minor accidents are reported. Independent driving assessment is available to determine safety of driving.   If you are interested in the  driving assessment, you can contact the following:  [employer] [facility] in [place] [phone]  [facility] [phone]  [facility] [phone] or [phone]    Mediterranean Diet A Mediterranean diet refers to food and lifestyle choices that are based on the traditions of countries located on the [employer]. This way of eating has been shown to help prevent certain conditions and improve outcomes for people who have chronic diseases, like kidney disease and heart disease. What are tips for following this plan? Lifestyle  Cook and eat meals together with your family, when possible. Drink enough fluid to keep your urine clear or pale yellow. Be physically active every day. This includes: Aerobic exercise like running or swimming. Leisure activities like gardening, walking, or housework. Get 7-8 hours of sleep each night. If recommended by your health care provider, drink red wine in moderation. This means 1 glass a day for nonpregnant women and 2 glasses a day for men. A glass of wine equals 5 oz (150 mL). Reading food labels  Check the serving size of packaged foods. For foods such as rice and pasta, the serving size refers to the amount of cooked product, not dry. Check the total fat in packaged foods. Avoid foods that have saturated fat or trans fats. Check the ingredients list for  added sugars, such as corn syrup. Shopping  At the grocery store, buy most of your food from the areas near the walls of the store. This includes: Fresh fruits and vegetables (produce). Grains, beans, nuts, and seeds. Some of these may be available in unpackaged forms or large amounts (in bulk). Fresh seafood. Poultry and eggs. Low-fat dairy products. Buy whole ingredients instead of prepackaged foods. Buy fresh fruits and vegetables in-season from local farmers markets. Buy frozen fruits and vegetables in resealable  bags. If you do not have access to quality fresh seafood, buy precooked frozen shrimp or canned fish, such as tuna, salmon, or sardines. Buy small amounts of raw or cooked vegetables, salads, or olives from the deli or salad bar at your store. Stock your pantry so you always have certain foods on hand, such as olive oil, canned tuna, canned tomatoes, rice, pasta, and beans. Cooking  Cook foods with extra-virgin olive oil instead of using butter or other vegetable oils. Have meat as a side dish, and have vegetables or grains as your main dish. This means having meat in small portions or adding small amounts of meat to foods like pasta or stew. Use beans or vegetables instead of meat in common dishes like chili or lasagna. Experiment with different cooking methods. Try roasting or broiling vegetables instead of steaming or sauteing them. Add frozen vegetables to soups, stews, pasta, or rice. Add nuts or seeds for added healthy fat at each meal. You can add these to yogurt, salads, or vegetable dishes. Marinate fish or vegetables using olive oil, lemon juice, garlic, and fresh herbs. Meal planning  Plan to eat 1 vegetarian meal one day each week. Try to work up to 2 vegetarian meals, if possible. Eat seafood 2 or more times a week. Have healthy snacks readily available, such as: Vegetable sticks with hummus. Greek yogurt. Fruit and nut trail mix. Eat balanced meals throughout the week. This includes: Fruit: 2-3 servings a day Vegetables: 4-5 servings a day Low-fat dairy: 2 servings a day Fish, poultry, or lean meat: 1 serving a day Beans and legumes: 2 or more servings a week Nuts and seeds: 1-2 servings a day Whole grains: 6-8 servings a day Extra-virgin olive oil: 3-4 servings a day Limit red meat and sweets to only a few servings a month What are my food choices? Mediterranean diet Recommended Grains: Whole-grain pasta. Brown rice. Bulgar wheat. Polenta. Couscous. Whole-wheat bread.  Orpah Cobb. Vegetables: Artichokes. Beets. Broccoli. Cabbage. Carrots. Eggplant. Green beans. Chard. Kale. Spinach. Onions. Leeks. Peas. Squash. Tomatoes. Peppers. Radishes. Fruits: Apples. Apricots. Avocado. Berries. Bananas. Cherries. Dates. Figs. Grapes. Lemons. Melon. Oranges. Peaches. Plums. Pomegranate. Meats and other protein foods: Beans. Almonds. Sunflower seeds. Pine nuts. Peanuts. Cod. Salmon. Scallops. Shrimp. Tuna. Tilapia. Clams. Oysters. Eggs. Dairy: Low-fat milk. Cheese. Greek yogurt. Beverages: Water. Red wine. Herbal tea. Fats and oils: Extra virgin olive oil. Avocado oil. Grape seed oil. Sweets and desserts: [place] yogurt with honey. Baked apples. Poached pears. Trail mix. Seasoning and other foods: Basil. Cilantro. Coriander. Cumin. Mint. Parsley. Sage. Rosemary. Tarragon. Garlic. Oregano. Thyme. Pepper. Balsalmic vinegar. Tahini. Hummus. Tomato sauce. Olives. Mushrooms. Limit these Grains: Prepackaged pasta or rice dishes. Prepackaged cereal with added sugar. Vegetables: Deep fried potatoes (french fries). Fruits: Fruit canned in syrup. Meats and other protein foods: Beef. Pork. Lamb. Poultry with skin. Hot dogs. Tomasa Blase. Dairy: Ice cream. Sour cream. Whole milk. Beverages: Juice. Sugar-sweetened soft drinks. Beer. Liquor and spirits. Fats and oils: Butter. Canola oil. Vegetable oil.  Beef fat (tallow). Lard. Sweets and desserts: Cookies. Cakes. Pies. Candy. Seasoning and other foods: Mayonnaise. Premade sauces and marinades. The items listed may not be a complete list. Talk with your dietitian about what dietary choices are right for you. Summary The Mediterranean diet includes both food and lifestyle choices. Eat a variety of fresh fruits and vegetables, beans, nuts, seeds, and whole grains. Limit the amount of red meat and sweets that you eat. Talk with your health care provider about whether it is safe for you to drink red wine in moderation. This means 1 glass a day  for nonpregnant women and 2 glasses a day for men. A glass of wine equals 5 oz (150 mL). This information is not intended to replace advice given to you by your health care provider. Make sure you discuss any questions you have with your health care provider. Document Released: 08/28/2015 Document Revised: 09/30/2015 Document Reviewed: 08/28/2015 Elsevier Interactive Patient Education  2017 [employer].      MRI brain at [facility] [phone] Labs today [address]

## 2022-08-30 DIAGNOSIS — N390 Urinary tract infection, site not specified: Secondary | ICD-10-CM | POA: Diagnosis not present

## 2022-08-30 DIAGNOSIS — N3281 Overactive bladder: Secondary | ICD-10-CM | POA: Diagnosis not present

## 2022-08-30 DIAGNOSIS — R300 Dysuria: Secondary | ICD-10-CM | POA: Diagnosis not present

## 2022-08-31 DIAGNOSIS — Z87440 Personal history of urinary (tract) infections: Secondary | ICD-10-CM | POA: Diagnosis not present

## 2022-08-31 DIAGNOSIS — M5441 Lumbago with sciatica, right side: Secondary | ICD-10-CM | POA: Diagnosis not present

## 2022-08-31 DIAGNOSIS — M5442 Lumbago with sciatica, left side: Secondary | ICD-10-CM | POA: Diagnosis not present

## 2022-08-31 DIAGNOSIS — R262 Difficulty in walking, not elsewhere classified: Secondary | ICD-10-CM | POA: Diagnosis not present

## 2022-08-31 DIAGNOSIS — G8929 Other chronic pain: Secondary | ICD-10-CM | POA: Diagnosis not present

## 2022-08-31 DIAGNOSIS — M1990 Unspecified osteoarthritis, unspecified site: Secondary | ICD-10-CM | POA: Diagnosis not present

## 2022-09-02 DIAGNOSIS — E538 Deficiency of other specified B group vitamins: Secondary | ICD-10-CM | POA: Diagnosis not present

## 2022-09-03 DIAGNOSIS — M5442 Lumbago with sciatica, left side: Secondary | ICD-10-CM | POA: Diagnosis not present

## 2022-09-03 DIAGNOSIS — G8929 Other chronic pain: Secondary | ICD-10-CM | POA: Diagnosis not present

## 2022-09-03 DIAGNOSIS — Z87440 Personal history of urinary (tract) infections: Secondary | ICD-10-CM | POA: Diagnosis not present

## 2022-09-03 DIAGNOSIS — R262 Difficulty in walking, not elsewhere classified: Secondary | ICD-10-CM | POA: Diagnosis not present

## 2022-09-03 DIAGNOSIS — M5441 Lumbago with sciatica, right side: Secondary | ICD-10-CM | POA: Diagnosis not present

## 2022-09-03 DIAGNOSIS — M1990 Unspecified osteoarthritis, unspecified site: Secondary | ICD-10-CM | POA: Diagnosis not present

## 2022-09-06 DIAGNOSIS — R300 Dysuria: Secondary | ICD-10-CM | POA: Diagnosis not present

## 2022-09-08 DIAGNOSIS — S81811A Laceration without foreign body, right lower leg, initial encounter: Secondary | ICD-10-CM | POA: Diagnosis not present

## 2022-09-15 DIAGNOSIS — S81811A Laceration without foreign body, right lower leg, initial encounter: Secondary | ICD-10-CM | POA: Diagnosis not present

## 2022-09-16 DIAGNOSIS — G8929 Other chronic pain: Secondary | ICD-10-CM | POA: Diagnosis not present

## 2022-09-16 DIAGNOSIS — Z87440 Personal history of urinary (tract) infections: Secondary | ICD-10-CM | POA: Diagnosis not present

## 2022-09-16 DIAGNOSIS — R262 Difficulty in walking, not elsewhere classified: Secondary | ICD-10-CM | POA: Diagnosis not present

## 2022-09-16 DIAGNOSIS — M5441 Lumbago with sciatica, right side: Secondary | ICD-10-CM | POA: Diagnosis not present

## 2022-09-16 DIAGNOSIS — M5442 Lumbago with sciatica, left side: Secondary | ICD-10-CM | POA: Diagnosis not present

## 2022-09-16 DIAGNOSIS — M1990 Unspecified osteoarthritis, unspecified site: Secondary | ICD-10-CM | POA: Diagnosis not present

## 2022-09-17 DIAGNOSIS — M5442 Lumbago with sciatica, left side: Secondary | ICD-10-CM | POA: Diagnosis not present

## 2022-09-17 DIAGNOSIS — M5441 Lumbago with sciatica, right side: Secondary | ICD-10-CM | POA: Diagnosis not present

## 2022-09-17 DIAGNOSIS — G8929 Other chronic pain: Secondary | ICD-10-CM | POA: Diagnosis not present

## 2022-09-17 DIAGNOSIS — Z87440 Personal history of urinary (tract) infections: Secondary | ICD-10-CM | POA: Diagnosis not present

## 2022-09-17 DIAGNOSIS — M1990 Unspecified osteoarthritis, unspecified site: Secondary | ICD-10-CM | POA: Diagnosis not present

## 2022-09-17 DIAGNOSIS — R262 Difficulty in walking, not elsewhere classified: Secondary | ICD-10-CM | POA: Diagnosis not present

## 2022-09-19 DIAGNOSIS — Z20822 Contact with and (suspected) exposure to covid-19: Secondary | ICD-10-CM | POA: Diagnosis not present

## 2022-09-22 DIAGNOSIS — Z515 Encounter for palliative care: Secondary | ICD-10-CM | POA: Diagnosis not present

## 2022-09-22 DIAGNOSIS — K219 Gastro-esophageal reflux disease without esophagitis: Secondary | ICD-10-CM | POA: Diagnosis not present

## 2022-09-22 DIAGNOSIS — Z853 Personal history of malignant neoplasm of breast: Secondary | ICD-10-CM | POA: Diagnosis not present

## 2022-09-22 DIAGNOSIS — I10 Essential (primary) hypertension: Secondary | ICD-10-CM | POA: Diagnosis not present

## 2022-09-22 DIAGNOSIS — M1990 Unspecified osteoarthritis, unspecified site: Secondary | ICD-10-CM | POA: Diagnosis not present

## 2022-09-22 DIAGNOSIS — G311 Senile degeneration of brain, not elsewhere classified: Secondary | ICD-10-CM | POA: Diagnosis not present

## 2022-09-22 DIAGNOSIS — M5430 Sciatica, unspecified side: Secondary | ICD-10-CM | POA: Diagnosis not present

## 2022-09-22 DIAGNOSIS — Z87440 Personal history of urinary (tract) infections: Secondary | ICD-10-CM | POA: Diagnosis not present

## 2022-09-22 DIAGNOSIS — Z66 Do not resuscitate: Secondary | ICD-10-CM | POA: Diagnosis not present

## 2022-09-22 DIAGNOSIS — J45909 Unspecified asthma, uncomplicated: Secondary | ICD-10-CM | POA: Diagnosis not present

## 2022-09-22 DIAGNOSIS — G459 Transient cerebral ischemic attack, unspecified: Secondary | ICD-10-CM | POA: Diagnosis not present

## 2022-09-22 DIAGNOSIS — F02A4 Dementia in other diseases classified elsewhere, mild, with anxiety: Secondary | ICD-10-CM | POA: Diagnosis not present

## 2022-09-23 DIAGNOSIS — G311 Senile degeneration of brain, not elsewhere classified: Secondary | ICD-10-CM | POA: Diagnosis not present

## 2022-09-23 DIAGNOSIS — Z87440 Personal history of urinary (tract) infections: Secondary | ICD-10-CM | POA: Diagnosis not present

## 2022-09-23 DIAGNOSIS — M1990 Unspecified osteoarthritis, unspecified site: Secondary | ICD-10-CM | POA: Diagnosis not present

## 2022-09-23 DIAGNOSIS — K219 Gastro-esophageal reflux disease without esophagitis: Secondary | ICD-10-CM | POA: Diagnosis not present

## 2022-09-23 DIAGNOSIS — I10 Essential (primary) hypertension: Secondary | ICD-10-CM | POA: Diagnosis not present

## 2022-09-23 DIAGNOSIS — F02A4 Dementia in other diseases classified elsewhere, mild, with anxiety: Secondary | ICD-10-CM | POA: Diagnosis not present

## 2022-09-24 DIAGNOSIS — M1990 Unspecified osteoarthritis, unspecified site: Secondary | ICD-10-CM | POA: Diagnosis not present

## 2022-09-24 DIAGNOSIS — F02A4 Dementia in other diseases classified elsewhere, mild, with anxiety: Secondary | ICD-10-CM | POA: Diagnosis not present

## 2022-09-24 DIAGNOSIS — I10 Essential (primary) hypertension: Secondary | ICD-10-CM | POA: Diagnosis not present

## 2022-09-24 DIAGNOSIS — R300 Dysuria: Secondary | ICD-10-CM | POA: Diagnosis not present

## 2022-09-24 DIAGNOSIS — K219 Gastro-esophageal reflux disease without esophagitis: Secondary | ICD-10-CM | POA: Diagnosis not present

## 2022-09-24 DIAGNOSIS — Z87440 Personal history of urinary (tract) infections: Secondary | ICD-10-CM | POA: Diagnosis not present

## 2022-09-24 DIAGNOSIS — G311 Senile degeneration of brain, not elsewhere classified: Secondary | ICD-10-CM | POA: Diagnosis not present

## 2022-09-27 DIAGNOSIS — M1990 Unspecified osteoarthritis, unspecified site: Secondary | ICD-10-CM | POA: Diagnosis not present

## 2022-09-27 DIAGNOSIS — I10 Essential (primary) hypertension: Secondary | ICD-10-CM | POA: Diagnosis not present

## 2022-09-27 DIAGNOSIS — G311 Senile degeneration of brain, not elsewhere classified: Secondary | ICD-10-CM | POA: Diagnosis not present

## 2022-09-27 DIAGNOSIS — Z87440 Personal history of urinary (tract) infections: Secondary | ICD-10-CM | POA: Diagnosis not present

## 2022-09-27 DIAGNOSIS — K219 Gastro-esophageal reflux disease without esophagitis: Secondary | ICD-10-CM | POA: Diagnosis not present

## 2022-09-27 DIAGNOSIS — F02A4 Dementia in other diseases classified elsewhere, mild, with anxiety: Secondary | ICD-10-CM | POA: Diagnosis not present

## 2022-09-30 DIAGNOSIS — M170 Bilateral primary osteoarthritis of knee: Secondary | ICD-10-CM | POA: Diagnosis not present

## 2022-10-01 DIAGNOSIS — F0390 Unspecified dementia without behavioral disturbance: Secondary | ICD-10-CM | POA: Diagnosis not present

## 2022-10-01 DIAGNOSIS — N3281 Overactive bladder: Secondary | ICD-10-CM | POA: Diagnosis not present

## 2022-10-04 DIAGNOSIS — G311 Senile degeneration of brain, not elsewhere classified: Secondary | ICD-10-CM | POA: Diagnosis not present

## 2022-10-04 DIAGNOSIS — I10 Essential (primary) hypertension: Secondary | ICD-10-CM | POA: Diagnosis not present

## 2022-10-04 DIAGNOSIS — F02A4 Dementia in other diseases classified elsewhere, mild, with anxiety: Secondary | ICD-10-CM | POA: Diagnosis not present

## 2022-10-04 DIAGNOSIS — Z87440 Personal history of urinary (tract) infections: Secondary | ICD-10-CM | POA: Diagnosis not present

## 2022-10-04 DIAGNOSIS — M1990 Unspecified osteoarthritis, unspecified site: Secondary | ICD-10-CM | POA: Diagnosis not present

## 2022-10-04 DIAGNOSIS — K219 Gastro-esophageal reflux disease without esophagitis: Secondary | ICD-10-CM | POA: Diagnosis not present

## 2022-10-05 DIAGNOSIS — Z87440 Personal history of urinary (tract) infections: Secondary | ICD-10-CM | POA: Diagnosis not present

## 2022-10-05 DIAGNOSIS — K219 Gastro-esophageal reflux disease without esophagitis: Secondary | ICD-10-CM | POA: Diagnosis not present

## 2022-10-05 DIAGNOSIS — F02A4 Dementia in other diseases classified elsewhere, mild, with anxiety: Secondary | ICD-10-CM | POA: Diagnosis not present

## 2022-10-05 DIAGNOSIS — I10 Essential (primary) hypertension: Secondary | ICD-10-CM | POA: Diagnosis not present

## 2022-10-05 DIAGNOSIS — M1990 Unspecified osteoarthritis, unspecified site: Secondary | ICD-10-CM | POA: Diagnosis not present

## 2022-10-05 DIAGNOSIS — G311 Senile degeneration of brain, not elsewhere classified: Secondary | ICD-10-CM | POA: Diagnosis not present

## 2022-10-06 DIAGNOSIS — S81802D Unspecified open wound, left lower leg, subsequent encounter: Secondary | ICD-10-CM | POA: Diagnosis not present

## 2022-10-07 ENCOUNTER — Inpatient Hospital Stay: Payer: Medicare Other | Attending: Oncology | Admitting: Oncology

## 2022-10-07 VITALS — BP 179/99 | HR 96 | Temp 98.0°F | Resp 14 | Ht 63.0 in | Wt 167.2 lb

## 2022-10-07 DIAGNOSIS — C50511 Malignant neoplasm of lower-outer quadrant of right female breast: Secondary | ICD-10-CM | POA: Diagnosis not present

## 2022-10-07 DIAGNOSIS — Z170 Estrogen receptor positive status [ER+]: Secondary | ICD-10-CM | POA: Diagnosis not present

## 2022-10-07 NOTE — Progress Notes (Signed)
[facility]  [address] [place],  [place]  [address] [phone]  Clinic Day:  10/07/2022  Referring physician: Hurshel Party, NP  HISTORY OF PRESENT ILLNESS:  The patient is an 82 y.o. 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  She is currently taking letrozole for her adjuvant endocrine therapy.  She comes in today for routine follow-up.  Since her last visit, the patient has been doing well.  She denies noticing any masses or other findings which concern her for early disease recurrence.  Of note, her annual mammogram in July 2024 showed no evidence of disease recurrence.   PHYSICAL EXAM:  Blood pressure (!) 179/99, pulse 96, temperature 98 F (36.7 C), resp. rate 14, height 5\' 3"  (1.6 m), weight 167 lb 3.2 oz (75.8 kg), SpO2 95%. Wt Readings from Last 3 Encounters:  10/07/22 167 lb 3.2 oz (75.8 kg)  08/23/22 169 lb (76.7 kg)  06/22/22 167 lb (75.8 kg)   Body mass index is 29.62 kg/m. Performance status (ECOG): 1 - Symptomatic but completely ambulatory Physical Exam Constitutional:      Appearance: Normal appearance.  HENT:     Mouth/Throat:     Pharynx: Oropharynx is clear. No oropharyngeal exudate.  Cardiovascular:     Rate and Rhythm: Normal rate and regular rhythm.     Heart sounds: No murmur heard.    No friction rub. No gallop.  Pulmonary:     Breath sounds: Normal breath sounds.  Chest:  Breasts:    Right: No swelling, bleeding, inverted nipple, mass, nipple discharge or skin change.     Left: No swelling, bleeding, inverted nipple, mass, nipple discharge or skin change.  Abdominal:     General: Bowel sounds are normal. There is no distension.     Palpations: Abdomen is soft. There is no mass.     Tenderness: There is no abdominal tenderness.  Musculoskeletal:        General: No tenderness.     Cervical back: Normal range of motion and neck supple.      Right lower leg: No edema.     Left lower leg: No edema.  Lymphadenopathy:     Cervical: No cervical adenopathy.     Right cervical: No superficial, deep or posterior cervical adenopathy.    Left cervical: No superficial, deep or posterior cervical adenopathy.     Upper Body:     Right upper body: No supraclavicular or axillary adenopathy.     Left upper body: No supraclavicular or axillary adenopathy.     Lower Body: No right inguinal adenopathy. No left inguinal adenopathy.  Skin:    Coloration: Skin is not jaundiced.     Findings: No lesion or rash.  Neurological:     General: No focal deficit present.     Mental Status: She is alert and oriented to person, place, and time. Mental status is at baseline.  Psychiatric:        Mood and Affect: Mood normal.        Behavior: Behavior normal.        Thought Content: Thought content normal.        Judgment: Judgment normal.    ASSESSMENT & PLAN:  An 82 y.o. 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  Based upon her clinical breast exam today and  recent mammogram, the patient remains disease-free.  She knows to continue taking her letrozole on a daily basis for 5 years of adjuvant endocrine therapy.  I will see her back in 4 months for her next clinical breast exam.  The patient understands all the plans discussed today and is in agreement with them.  Brenin Heidelberger Kirby Funk, MD

## 2022-10-08 ENCOUNTER — Telehealth: Payer: Self-pay | Admitting: Oncology

## 2022-10-08 NOTE — Telephone Encounter (Signed)
Contacted pt to schedule an appt. Unable to reach via phone, voicemail was left.   Scheduling Message Entered by Rennis Harding A on 10/07/2022 at  6:11 PM Priority: Routine <No visit type provided>  Department: [facility]  Provider:  Scheduling Notes:  Appt 02-07-23

## 2022-10-09 DIAGNOSIS — M1711 Unilateral primary osteoarthritis, right knee: Secondary | ICD-10-CM | POA: Diagnosis not present

## 2022-10-09 DIAGNOSIS — S8991XA Unspecified injury of right lower leg, initial encounter: Secondary | ICD-10-CM | POA: Diagnosis not present

## 2022-10-09 DIAGNOSIS — W208XXA Other cause of strike by thrown, projected or falling object, initial encounter: Secondary | ICD-10-CM | POA: Diagnosis not present

## 2022-10-09 DIAGNOSIS — S81811A Laceration without foreign body, right lower leg, initial encounter: Secondary | ICD-10-CM | POA: Diagnosis not present

## 2022-10-11 DIAGNOSIS — Z87440 Personal history of urinary (tract) infections: Secondary | ICD-10-CM | POA: Diagnosis not present

## 2022-10-11 DIAGNOSIS — K219 Gastro-esophageal reflux disease without esophagitis: Secondary | ICD-10-CM | POA: Diagnosis not present

## 2022-10-11 DIAGNOSIS — M1990 Unspecified osteoarthritis, unspecified site: Secondary | ICD-10-CM | POA: Diagnosis not present

## 2022-10-11 DIAGNOSIS — G311 Senile degeneration of brain, not elsewhere classified: Secondary | ICD-10-CM | POA: Diagnosis not present

## 2022-10-11 DIAGNOSIS — F02A4 Dementia in other diseases classified elsewhere, mild, with anxiety: Secondary | ICD-10-CM | POA: Diagnosis not present

## 2022-10-11 DIAGNOSIS — I10 Essential (primary) hypertension: Secondary | ICD-10-CM | POA: Diagnosis not present

## 2022-10-12 DIAGNOSIS — E538 Deficiency of other specified B group vitamins: Secondary | ICD-10-CM | POA: Diagnosis not present

## 2022-10-12 DIAGNOSIS — S81811A Laceration without foreign body, right lower leg, initial encounter: Secondary | ICD-10-CM | POA: Diagnosis not present

## 2022-10-12 DIAGNOSIS — F03A3 Unspecified dementia, mild, with mood disturbance: Secondary | ICD-10-CM | POA: Diagnosis not present

## 2022-10-13 DIAGNOSIS — G311 Senile degeneration of brain, not elsewhere classified: Secondary | ICD-10-CM | POA: Diagnosis not present

## 2022-10-13 DIAGNOSIS — M1990 Unspecified osteoarthritis, unspecified site: Secondary | ICD-10-CM | POA: Diagnosis not present

## 2022-10-13 DIAGNOSIS — I10 Essential (primary) hypertension: Secondary | ICD-10-CM | POA: Diagnosis not present

## 2022-10-13 DIAGNOSIS — F02A4 Dementia in other diseases classified elsewhere, mild, with anxiety: Secondary | ICD-10-CM | POA: Diagnosis not present

## 2022-10-13 DIAGNOSIS — Z87440 Personal history of urinary (tract) infections: Secondary | ICD-10-CM | POA: Diagnosis not present

## 2022-10-13 DIAGNOSIS — K219 Gastro-esophageal reflux disease without esophagitis: Secondary | ICD-10-CM | POA: Diagnosis not present

## 2022-10-14 DIAGNOSIS — G311 Senile degeneration of brain, not elsewhere classified: Secondary | ICD-10-CM | POA: Diagnosis not present

## 2022-10-14 DIAGNOSIS — F02A4 Dementia in other diseases classified elsewhere, mild, with anxiety: Secondary | ICD-10-CM | POA: Diagnosis not present

## 2022-10-14 DIAGNOSIS — M1990 Unspecified osteoarthritis, unspecified site: Secondary | ICD-10-CM | POA: Diagnosis not present

## 2022-10-14 DIAGNOSIS — K219 Gastro-esophageal reflux disease without esophagitis: Secondary | ICD-10-CM | POA: Diagnosis not present

## 2022-10-14 DIAGNOSIS — I10 Essential (primary) hypertension: Secondary | ICD-10-CM | POA: Diagnosis not present

## 2022-10-14 DIAGNOSIS — Z87440 Personal history of urinary (tract) infections: Secondary | ICD-10-CM | POA: Diagnosis not present

## 2022-10-18 DIAGNOSIS — K219 Gastro-esophageal reflux disease without esophagitis: Secondary | ICD-10-CM | POA: Diagnosis not present

## 2022-10-18 DIAGNOSIS — N3281 Overactive bladder: Secondary | ICD-10-CM | POA: Diagnosis not present

## 2022-10-18 DIAGNOSIS — G311 Senile degeneration of brain, not elsewhere classified: Secondary | ICD-10-CM | POA: Diagnosis not present

## 2022-10-18 DIAGNOSIS — Z9229 Personal history of other drug therapy: Secondary | ICD-10-CM | POA: Diagnosis not present

## 2022-10-18 DIAGNOSIS — F02A4 Dementia in other diseases classified elsewhere, mild, with anxiety: Secondary | ICD-10-CM | POA: Diagnosis not present

## 2022-10-18 DIAGNOSIS — M1990 Unspecified osteoarthritis, unspecified site: Secondary | ICD-10-CM | POA: Diagnosis not present

## 2022-10-18 DIAGNOSIS — I10 Essential (primary) hypertension: Secondary | ICD-10-CM | POA: Diagnosis not present

## 2022-10-18 DIAGNOSIS — Z87440 Personal history of urinary (tract) infections: Secondary | ICD-10-CM | POA: Diagnosis not present

## 2022-10-19 DIAGNOSIS — I10 Essential (primary) hypertension: Secondary | ICD-10-CM | POA: Diagnosis not present

## 2022-10-19 DIAGNOSIS — M5430 Sciatica, unspecified side: Secondary | ICD-10-CM | POA: Diagnosis not present

## 2022-10-19 DIAGNOSIS — Z87440 Personal history of urinary (tract) infections: Secondary | ICD-10-CM | POA: Diagnosis not present

## 2022-10-19 DIAGNOSIS — Z515 Encounter for palliative care: Secondary | ICD-10-CM | POA: Diagnosis not present

## 2022-10-19 DIAGNOSIS — F02A4 Dementia in other diseases classified elsewhere, mild, with anxiety: Secondary | ICD-10-CM | POA: Diagnosis not present

## 2022-10-19 DIAGNOSIS — Z66 Do not resuscitate: Secondary | ICD-10-CM | POA: Diagnosis not present

## 2022-10-19 DIAGNOSIS — M1990 Unspecified osteoarthritis, unspecified site: Secondary | ICD-10-CM | POA: Diagnosis not present

## 2022-10-19 DIAGNOSIS — J45909 Unspecified asthma, uncomplicated: Secondary | ICD-10-CM | POA: Diagnosis not present

## 2022-10-19 DIAGNOSIS — Z853 Personal history of malignant neoplasm of breast: Secondary | ICD-10-CM | POA: Diagnosis not present

## 2022-10-19 DIAGNOSIS — G459 Transient cerebral ischemic attack, unspecified: Secondary | ICD-10-CM | POA: Diagnosis not present

## 2022-10-19 DIAGNOSIS — G311 Senile degeneration of brain, not elsewhere classified: Secondary | ICD-10-CM | POA: Diagnosis not present

## 2022-10-19 DIAGNOSIS — K219 Gastro-esophageal reflux disease without esophagitis: Secondary | ICD-10-CM | POA: Diagnosis not present

## 2022-10-20 DIAGNOSIS — G311 Senile degeneration of brain, not elsewhere classified: Secondary | ICD-10-CM | POA: Diagnosis not present

## 2022-10-20 DIAGNOSIS — I10 Essential (primary) hypertension: Secondary | ICD-10-CM | POA: Diagnosis not present

## 2022-10-20 DIAGNOSIS — Z87440 Personal history of urinary (tract) infections: Secondary | ICD-10-CM | POA: Diagnosis not present

## 2022-10-20 DIAGNOSIS — F02A4 Dementia in other diseases classified elsewhere, mild, with anxiety: Secondary | ICD-10-CM | POA: Diagnosis not present

## 2022-10-20 DIAGNOSIS — M1990 Unspecified osteoarthritis, unspecified site: Secondary | ICD-10-CM | POA: Diagnosis not present

## 2022-10-20 DIAGNOSIS — K219 Gastro-esophageal reflux disease without esophagitis: Secondary | ICD-10-CM | POA: Diagnosis not present

## 2022-10-20 DIAGNOSIS — S81811D Laceration without foreign body, right lower leg, subsequent encounter: Secondary | ICD-10-CM | POA: Diagnosis not present

## 2022-10-22 DIAGNOSIS — K219 Gastro-esophageal reflux disease without esophagitis: Secondary | ICD-10-CM | POA: Diagnosis not present

## 2022-10-22 DIAGNOSIS — Z87440 Personal history of urinary (tract) infections: Secondary | ICD-10-CM | POA: Diagnosis not present

## 2022-10-22 DIAGNOSIS — F02A4 Dementia in other diseases classified elsewhere, mild, with anxiety: Secondary | ICD-10-CM | POA: Diagnosis not present

## 2022-10-22 DIAGNOSIS — M1990 Unspecified osteoarthritis, unspecified site: Secondary | ICD-10-CM | POA: Diagnosis not present

## 2022-10-22 DIAGNOSIS — I10 Essential (primary) hypertension: Secondary | ICD-10-CM | POA: Diagnosis not present

## 2022-10-22 DIAGNOSIS — G311 Senile degeneration of brain, not elsewhere classified: Secondary | ICD-10-CM | POA: Diagnosis not present

## 2022-10-25 DIAGNOSIS — G311 Senile degeneration of brain, not elsewhere classified: Secondary | ICD-10-CM | POA: Diagnosis not present

## 2022-10-25 DIAGNOSIS — Z87440 Personal history of urinary (tract) infections: Secondary | ICD-10-CM | POA: Diagnosis not present

## 2022-10-25 DIAGNOSIS — K219 Gastro-esophageal reflux disease without esophagitis: Secondary | ICD-10-CM | POA: Diagnosis not present

## 2022-10-25 DIAGNOSIS — M1990 Unspecified osteoarthritis, unspecified site: Secondary | ICD-10-CM | POA: Diagnosis not present

## 2022-10-25 DIAGNOSIS — I10 Essential (primary) hypertension: Secondary | ICD-10-CM | POA: Diagnosis not present

## 2022-10-25 DIAGNOSIS — F02A4 Dementia in other diseases classified elsewhere, mild, with anxiety: Secondary | ICD-10-CM | POA: Diagnosis not present

## 2022-10-26 DIAGNOSIS — S81802D Unspecified open wound, left lower leg, subsequent encounter: Secondary | ICD-10-CM | POA: Diagnosis not present

## 2022-10-27 DIAGNOSIS — K219 Gastro-esophageal reflux disease without esophagitis: Secondary | ICD-10-CM | POA: Diagnosis not present

## 2022-10-27 DIAGNOSIS — G311 Senile degeneration of brain, not elsewhere classified: Secondary | ICD-10-CM | POA: Diagnosis not present

## 2022-10-27 DIAGNOSIS — I10 Essential (primary) hypertension: Secondary | ICD-10-CM | POA: Diagnosis not present

## 2022-10-27 DIAGNOSIS — Z87440 Personal history of urinary (tract) infections: Secondary | ICD-10-CM | POA: Diagnosis not present

## 2022-10-27 DIAGNOSIS — M1990 Unspecified osteoarthritis, unspecified site: Secondary | ICD-10-CM | POA: Diagnosis not present

## 2022-10-27 DIAGNOSIS — F02A4 Dementia in other diseases classified elsewhere, mild, with anxiety: Secondary | ICD-10-CM | POA: Diagnosis not present

## 2022-10-28 DIAGNOSIS — Z87440 Personal history of urinary (tract) infections: Secondary | ICD-10-CM | POA: Diagnosis not present

## 2022-10-28 DIAGNOSIS — M1990 Unspecified osteoarthritis, unspecified site: Secondary | ICD-10-CM | POA: Diagnosis not present

## 2022-10-28 DIAGNOSIS — I10 Essential (primary) hypertension: Secondary | ICD-10-CM | POA: Diagnosis not present

## 2022-10-28 DIAGNOSIS — K219 Gastro-esophageal reflux disease without esophagitis: Secondary | ICD-10-CM | POA: Diagnosis not present

## 2022-10-28 DIAGNOSIS — G311 Senile degeneration of brain, not elsewhere classified: Secondary | ICD-10-CM | POA: Diagnosis not present

## 2022-10-28 DIAGNOSIS — F02A4 Dementia in other diseases classified elsewhere, mild, with anxiety: Secondary | ICD-10-CM | POA: Diagnosis not present

## 2022-10-29 DIAGNOSIS — K219 Gastro-esophageal reflux disease without esophagitis: Secondary | ICD-10-CM | POA: Diagnosis not present

## 2022-10-29 DIAGNOSIS — Z87440 Personal history of urinary (tract) infections: Secondary | ICD-10-CM | POA: Diagnosis not present

## 2022-10-29 DIAGNOSIS — M1990 Unspecified osteoarthritis, unspecified site: Secondary | ICD-10-CM | POA: Diagnosis not present

## 2022-10-29 DIAGNOSIS — F02A4 Dementia in other diseases classified elsewhere, mild, with anxiety: Secondary | ICD-10-CM | POA: Diagnosis not present

## 2022-10-29 DIAGNOSIS — G311 Senile degeneration of brain, not elsewhere classified: Secondary | ICD-10-CM | POA: Diagnosis not present

## 2022-10-29 DIAGNOSIS — I10 Essential (primary) hypertension: Secondary | ICD-10-CM | POA: Diagnosis not present

## 2022-11-01 DIAGNOSIS — G311 Senile degeneration of brain, not elsewhere classified: Secondary | ICD-10-CM | POA: Diagnosis not present

## 2022-11-01 DIAGNOSIS — I10 Essential (primary) hypertension: Secondary | ICD-10-CM | POA: Diagnosis not present

## 2022-11-01 DIAGNOSIS — K219 Gastro-esophageal reflux disease without esophagitis: Secondary | ICD-10-CM | POA: Diagnosis not present

## 2022-11-01 DIAGNOSIS — Z87440 Personal history of urinary (tract) infections: Secondary | ICD-10-CM | POA: Diagnosis not present

## 2022-11-01 DIAGNOSIS — M1990 Unspecified osteoarthritis, unspecified site: Secondary | ICD-10-CM | POA: Diagnosis not present

## 2022-11-01 DIAGNOSIS — F02A4 Dementia in other diseases classified elsewhere, mild, with anxiety: Secondary | ICD-10-CM | POA: Diagnosis not present

## 2022-11-03 DIAGNOSIS — G311 Senile degeneration of brain, not elsewhere classified: Secondary | ICD-10-CM | POA: Diagnosis not present

## 2022-11-03 DIAGNOSIS — K219 Gastro-esophageal reflux disease without esophagitis: Secondary | ICD-10-CM | POA: Diagnosis not present

## 2022-11-03 DIAGNOSIS — M1990 Unspecified osteoarthritis, unspecified site: Secondary | ICD-10-CM | POA: Diagnosis not present

## 2022-11-03 DIAGNOSIS — F02A4 Dementia in other diseases classified elsewhere, mild, with anxiety: Secondary | ICD-10-CM | POA: Diagnosis not present

## 2022-11-03 DIAGNOSIS — I10 Essential (primary) hypertension: Secondary | ICD-10-CM | POA: Diagnosis not present

## 2022-11-03 DIAGNOSIS — Z87440 Personal history of urinary (tract) infections: Secondary | ICD-10-CM | POA: Diagnosis not present

## 2022-11-04 DIAGNOSIS — S81812A Laceration without foreign body, left lower leg, initial encounter: Secondary | ICD-10-CM | POA: Diagnosis not present

## 2022-11-04 DIAGNOSIS — S81811D Laceration without foreign body, right lower leg, subsequent encounter: Secondary | ICD-10-CM | POA: Diagnosis not present

## 2022-11-05 DIAGNOSIS — K219 Gastro-esophageal reflux disease without esophagitis: Secondary | ICD-10-CM | POA: Diagnosis not present

## 2022-11-05 DIAGNOSIS — Z87440 Personal history of urinary (tract) infections: Secondary | ICD-10-CM | POA: Diagnosis not present

## 2022-11-05 DIAGNOSIS — M1990 Unspecified osteoarthritis, unspecified site: Secondary | ICD-10-CM | POA: Diagnosis not present

## 2022-11-05 DIAGNOSIS — G311 Senile degeneration of brain, not elsewhere classified: Secondary | ICD-10-CM | POA: Diagnosis not present

## 2022-11-05 DIAGNOSIS — F02A4 Dementia in other diseases classified elsewhere, mild, with anxiety: Secondary | ICD-10-CM | POA: Diagnosis not present

## 2022-11-05 DIAGNOSIS — I10 Essential (primary) hypertension: Secondary | ICD-10-CM | POA: Diagnosis not present

## 2022-11-08 DIAGNOSIS — N3000 Acute cystitis without hematuria: Secondary | ICD-10-CM | POA: Diagnosis not present

## 2022-11-08 DIAGNOSIS — I10 Essential (primary) hypertension: Secondary | ICD-10-CM | POA: Diagnosis not present

## 2022-11-08 DIAGNOSIS — Z87440 Personal history of urinary (tract) infections: Secondary | ICD-10-CM | POA: Diagnosis not present

## 2022-11-08 DIAGNOSIS — N3281 Overactive bladder: Secondary | ICD-10-CM | POA: Diagnosis not present

## 2022-11-08 DIAGNOSIS — Z9229 Personal history of other drug therapy: Secondary | ICD-10-CM | POA: Diagnosis not present

## 2022-11-08 DIAGNOSIS — F02A4 Dementia in other diseases classified elsewhere, mild, with anxiety: Secondary | ICD-10-CM | POA: Diagnosis not present

## 2022-11-08 DIAGNOSIS — K219 Gastro-esophageal reflux disease without esophagitis: Secondary | ICD-10-CM | POA: Diagnosis not present

## 2022-11-08 DIAGNOSIS — G311 Senile degeneration of brain, not elsewhere classified: Secondary | ICD-10-CM | POA: Diagnosis not present

## 2022-11-08 DIAGNOSIS — M1990 Unspecified osteoarthritis, unspecified site: Secondary | ICD-10-CM | POA: Diagnosis not present

## 2022-11-08 DIAGNOSIS — N390 Urinary tract infection, site not specified: Secondary | ICD-10-CM | POA: Diagnosis not present

## 2022-11-09 DIAGNOSIS — F02A4 Dementia in other diseases classified elsewhere, mild, with anxiety: Secondary | ICD-10-CM | POA: Diagnosis not present

## 2022-11-09 DIAGNOSIS — G311 Senile degeneration of brain, not elsewhere classified: Secondary | ICD-10-CM | POA: Diagnosis not present

## 2022-11-09 DIAGNOSIS — K219 Gastro-esophageal reflux disease without esophagitis: Secondary | ICD-10-CM | POA: Diagnosis not present

## 2022-11-09 DIAGNOSIS — I10 Essential (primary) hypertension: Secondary | ICD-10-CM | POA: Diagnosis not present

## 2022-11-09 DIAGNOSIS — M1990 Unspecified osteoarthritis, unspecified site: Secondary | ICD-10-CM | POA: Diagnosis not present

## 2022-11-09 DIAGNOSIS — Z87440 Personal history of urinary (tract) infections: Secondary | ICD-10-CM | POA: Diagnosis not present

## 2022-11-10 DIAGNOSIS — S81811D Laceration without foreign body, right lower leg, subsequent encounter: Secondary | ICD-10-CM | POA: Diagnosis not present

## 2022-11-10 DIAGNOSIS — S81812D Laceration without foreign body, left lower leg, subsequent encounter: Secondary | ICD-10-CM | POA: Diagnosis not present

## 2022-11-11 ENCOUNTER — Telehealth: Payer: Self-pay

## 2022-11-11 DIAGNOSIS — K219 Gastro-esophageal reflux disease without esophagitis: Secondary | ICD-10-CM | POA: Diagnosis not present

## 2022-11-11 DIAGNOSIS — F02A4 Dementia in other diseases classified elsewhere, mild, with anxiety: Secondary | ICD-10-CM | POA: Diagnosis not present

## 2022-11-11 DIAGNOSIS — I10 Essential (primary) hypertension: Secondary | ICD-10-CM | POA: Diagnosis not present

## 2022-11-11 DIAGNOSIS — M1990 Unspecified osteoarthritis, unspecified site: Secondary | ICD-10-CM | POA: Diagnosis not present

## 2022-11-11 DIAGNOSIS — Z87440 Personal history of urinary (tract) infections: Secondary | ICD-10-CM | POA: Diagnosis not present

## 2022-11-11 DIAGNOSIS — E538 Deficiency of other specified B group vitamins: Secondary | ICD-10-CM | POA: Diagnosis not present

## 2022-11-11 DIAGNOSIS — G311 Senile degeneration of brain, not elsewhere classified: Secondary | ICD-10-CM | POA: Diagnosis not present

## 2022-11-11 NOTE — Telephone Encounter (Signed)
Transition Care Management Unsuccessful Follow-up Telephone Call  Date of discharge and from where:  [facility] 9/21  Attempts:  1st Attempt  Reason for unsuccessful TCM follow-up call:  No answer/busy   Lenard Forth [place]  [facility], [facility] Guide, Phone: [phone] Website: [email or web address]

## 2022-11-12 ENCOUNTER — Telehealth: Payer: Self-pay

## 2022-11-12 NOTE — Telephone Encounter (Signed)
Transition Care Management Unsuccessful Follow-up Telephone Call  Date of discharge and from where:  [facility] 9/21  Attempts:  2nd Attempt  Reason for unsuccessful TCM follow-up call:  Unable to leave message   Lenard Forth [facility], [facility] Guide, Phone: [phone] Website: [email or web address]

## 2022-11-15 DIAGNOSIS — Z87440 Personal history of urinary (tract) infections: Secondary | ICD-10-CM | POA: Diagnosis not present

## 2022-11-15 DIAGNOSIS — F02A4 Dementia in other diseases classified elsewhere, mild, with anxiety: Secondary | ICD-10-CM | POA: Diagnosis not present

## 2022-11-15 DIAGNOSIS — K219 Gastro-esophageal reflux disease without esophagitis: Secondary | ICD-10-CM | POA: Diagnosis not present

## 2022-11-15 DIAGNOSIS — I10 Essential (primary) hypertension: Secondary | ICD-10-CM | POA: Diagnosis not present

## 2022-11-15 DIAGNOSIS — G311 Senile degeneration of brain, not elsewhere classified: Secondary | ICD-10-CM | POA: Diagnosis not present

## 2022-11-15 DIAGNOSIS — M1990 Unspecified osteoarthritis, unspecified site: Secondary | ICD-10-CM | POA: Diagnosis not present

## 2022-11-16 DIAGNOSIS — G311 Senile degeneration of brain, not elsewhere classified: Secondary | ICD-10-CM | POA: Diagnosis not present

## 2022-11-16 DIAGNOSIS — I10 Essential (primary) hypertension: Secondary | ICD-10-CM | POA: Diagnosis not present

## 2022-11-16 DIAGNOSIS — F02A4 Dementia in other diseases classified elsewhere, mild, with anxiety: Secondary | ICD-10-CM | POA: Diagnosis not present

## 2022-11-16 DIAGNOSIS — M1990 Unspecified osteoarthritis, unspecified site: Secondary | ICD-10-CM | POA: Diagnosis not present

## 2022-11-16 DIAGNOSIS — Z87440 Personal history of urinary (tract) infections: Secondary | ICD-10-CM | POA: Diagnosis not present

## 2022-11-16 DIAGNOSIS — K219 Gastro-esophageal reflux disease without esophagitis: Secondary | ICD-10-CM | POA: Diagnosis not present

## 2022-11-17 DIAGNOSIS — S81811D Laceration without foreign body, right lower leg, subsequent encounter: Secondary | ICD-10-CM | POA: Diagnosis not present

## 2022-11-17 DIAGNOSIS — S81812D Laceration without foreign body, left lower leg, subsequent encounter: Secondary | ICD-10-CM | POA: Diagnosis not present

## 2022-11-19 DIAGNOSIS — F02A4 Dementia in other diseases classified elsewhere, mild, with anxiety: Secondary | ICD-10-CM | POA: Diagnosis not present

## 2022-11-19 DIAGNOSIS — G311 Senile degeneration of brain, not elsewhere classified: Secondary | ICD-10-CM | POA: Diagnosis not present

## 2022-11-19 DIAGNOSIS — J45909 Unspecified asthma, uncomplicated: Secondary | ICD-10-CM | POA: Diagnosis not present

## 2022-11-19 DIAGNOSIS — M1990 Unspecified osteoarthritis, unspecified site: Secondary | ICD-10-CM | POA: Diagnosis not present

## 2022-11-19 DIAGNOSIS — Z66 Do not resuscitate: Secondary | ICD-10-CM | POA: Diagnosis not present

## 2022-11-19 DIAGNOSIS — G459 Transient cerebral ischemic attack, unspecified: Secondary | ICD-10-CM | POA: Diagnosis not present

## 2022-11-19 DIAGNOSIS — M5430 Sciatica, unspecified side: Secondary | ICD-10-CM | POA: Diagnosis not present

## 2022-11-19 DIAGNOSIS — Z87440 Personal history of urinary (tract) infections: Secondary | ICD-10-CM | POA: Diagnosis not present

## 2022-11-19 DIAGNOSIS — K219 Gastro-esophageal reflux disease without esophagitis: Secondary | ICD-10-CM | POA: Diagnosis not present

## 2022-11-19 DIAGNOSIS — I10 Essential (primary) hypertension: Secondary | ICD-10-CM | POA: Diagnosis not present

## 2022-11-19 DIAGNOSIS — Z853 Personal history of malignant neoplasm of breast: Secondary | ICD-10-CM | POA: Diagnosis not present

## 2022-11-19 DIAGNOSIS — Z515 Encounter for palliative care: Secondary | ICD-10-CM | POA: Diagnosis not present

## 2022-11-21 DIAGNOSIS — M1990 Unspecified osteoarthritis, unspecified site: Secondary | ICD-10-CM | POA: Diagnosis not present

## 2022-11-21 DIAGNOSIS — I10 Essential (primary) hypertension: Secondary | ICD-10-CM | POA: Diagnosis not present

## 2022-11-21 DIAGNOSIS — K219 Gastro-esophageal reflux disease without esophagitis: Secondary | ICD-10-CM | POA: Diagnosis not present

## 2022-11-21 DIAGNOSIS — F02A4 Dementia in other diseases classified elsewhere, mild, with anxiety: Secondary | ICD-10-CM | POA: Diagnosis not present

## 2022-11-21 DIAGNOSIS — Z87440 Personal history of urinary (tract) infections: Secondary | ICD-10-CM | POA: Diagnosis not present

## 2022-11-21 DIAGNOSIS — G311 Senile degeneration of brain, not elsewhere classified: Secondary | ICD-10-CM | POA: Diagnosis not present

## 2022-11-22 DIAGNOSIS — F02A4 Dementia in other diseases classified elsewhere, mild, with anxiety: Secondary | ICD-10-CM | POA: Diagnosis not present

## 2022-11-22 DIAGNOSIS — M1990 Unspecified osteoarthritis, unspecified site: Secondary | ICD-10-CM | POA: Diagnosis not present

## 2022-11-22 DIAGNOSIS — G311 Senile degeneration of brain, not elsewhere classified: Secondary | ICD-10-CM | POA: Diagnosis not present

## 2022-11-22 DIAGNOSIS — I10 Essential (primary) hypertension: Secondary | ICD-10-CM | POA: Diagnosis not present

## 2022-11-22 DIAGNOSIS — Z87440 Personal history of urinary (tract) infections: Secondary | ICD-10-CM | POA: Diagnosis not present

## 2022-11-22 DIAGNOSIS — R197 Diarrhea, unspecified: Secondary | ICD-10-CM | POA: Diagnosis not present

## 2022-11-22 DIAGNOSIS — N390 Urinary tract infection, site not specified: Secondary | ICD-10-CM | POA: Diagnosis not present

## 2022-11-22 DIAGNOSIS — Z6830 Body mass index (BMI) 30.0-30.9, adult: Secondary | ICD-10-CM | POA: Diagnosis not present

## 2022-11-22 DIAGNOSIS — R531 Weakness: Secondary | ICD-10-CM | POA: Diagnosis not present

## 2022-11-22 DIAGNOSIS — K219 Gastro-esophageal reflux disease without esophagitis: Secondary | ICD-10-CM | POA: Diagnosis not present

## 2022-11-23 DIAGNOSIS — Z87440 Personal history of urinary (tract) infections: Secondary | ICD-10-CM | POA: Diagnosis not present

## 2022-11-23 DIAGNOSIS — I10 Essential (primary) hypertension: Secondary | ICD-10-CM | POA: Diagnosis not present

## 2022-11-23 DIAGNOSIS — G311 Senile degeneration of brain, not elsewhere classified: Secondary | ICD-10-CM | POA: Diagnosis not present

## 2022-11-23 DIAGNOSIS — M1990 Unspecified osteoarthritis, unspecified site: Secondary | ICD-10-CM | POA: Diagnosis not present

## 2022-11-23 DIAGNOSIS — K219 Gastro-esophageal reflux disease without esophagitis: Secondary | ICD-10-CM | POA: Diagnosis not present

## 2022-11-23 DIAGNOSIS — F02A4 Dementia in other diseases classified elsewhere, mild, with anxiety: Secondary | ICD-10-CM | POA: Diagnosis not present

## 2022-11-24 DIAGNOSIS — S81802D Unspecified open wound, left lower leg, subsequent encounter: Secondary | ICD-10-CM | POA: Diagnosis not present

## 2022-11-24 DIAGNOSIS — S81811D Laceration without foreign body, right lower leg, subsequent encounter: Secondary | ICD-10-CM | POA: Diagnosis not present

## 2022-11-25 DIAGNOSIS — F02A4 Dementia in other diseases classified elsewhere, mild, with anxiety: Secondary | ICD-10-CM | POA: Diagnosis not present

## 2022-11-25 DIAGNOSIS — G311 Senile degeneration of brain, not elsewhere classified: Secondary | ICD-10-CM | POA: Diagnosis not present

## 2022-11-25 DIAGNOSIS — M1990 Unspecified osteoarthritis, unspecified site: Secondary | ICD-10-CM | POA: Diagnosis not present

## 2022-11-25 DIAGNOSIS — I10 Essential (primary) hypertension: Secondary | ICD-10-CM | POA: Diagnosis not present

## 2022-11-25 DIAGNOSIS — K219 Gastro-esophageal reflux disease without esophagitis: Secondary | ICD-10-CM | POA: Diagnosis not present

## 2022-11-25 DIAGNOSIS — Z87440 Personal history of urinary (tract) infections: Secondary | ICD-10-CM | POA: Diagnosis not present

## 2022-11-26 DIAGNOSIS — F02A4 Dementia in other diseases classified elsewhere, mild, with anxiety: Secondary | ICD-10-CM | POA: Diagnosis not present

## 2022-11-26 DIAGNOSIS — I10 Essential (primary) hypertension: Secondary | ICD-10-CM | POA: Diagnosis not present

## 2022-11-26 DIAGNOSIS — M1990 Unspecified osteoarthritis, unspecified site: Secondary | ICD-10-CM | POA: Diagnosis not present

## 2022-11-26 DIAGNOSIS — K219 Gastro-esophageal reflux disease without esophagitis: Secondary | ICD-10-CM | POA: Diagnosis not present

## 2022-11-26 DIAGNOSIS — G311 Senile degeneration of brain, not elsewhere classified: Secondary | ICD-10-CM | POA: Diagnosis not present

## 2022-11-26 DIAGNOSIS — Z87440 Personal history of urinary (tract) infections: Secondary | ICD-10-CM | POA: Diagnosis not present

## 2022-11-29 DIAGNOSIS — M1990 Unspecified osteoarthritis, unspecified site: Secondary | ICD-10-CM | POA: Diagnosis not present

## 2022-11-29 DIAGNOSIS — Z87440 Personal history of urinary (tract) infections: Secondary | ICD-10-CM | POA: Diagnosis not present

## 2022-11-29 DIAGNOSIS — K219 Gastro-esophageal reflux disease without esophagitis: Secondary | ICD-10-CM | POA: Diagnosis not present

## 2022-11-29 DIAGNOSIS — F02A4 Dementia in other diseases classified elsewhere, mild, with anxiety: Secondary | ICD-10-CM | POA: Diagnosis not present

## 2022-11-29 DIAGNOSIS — I10 Essential (primary) hypertension: Secondary | ICD-10-CM | POA: Diagnosis not present

## 2022-11-29 DIAGNOSIS — G311 Senile degeneration of brain, not elsewhere classified: Secondary | ICD-10-CM | POA: Diagnosis not present

## 2022-11-30 DIAGNOSIS — G8929 Other chronic pain: Secondary | ICD-10-CM | POA: Diagnosis not present

## 2022-11-30 DIAGNOSIS — F419 Anxiety disorder, unspecified: Secondary | ICD-10-CM | POA: Diagnosis not present

## 2022-11-30 DIAGNOSIS — Z23 Encounter for immunization: Secondary | ICD-10-CM | POA: Diagnosis not present

## 2022-11-30 DIAGNOSIS — M5442 Lumbago with sciatica, left side: Secondary | ICD-10-CM | POA: Diagnosis not present

## 2022-11-30 DIAGNOSIS — R739 Hyperglycemia, unspecified: Secondary | ICD-10-CM | POA: Diagnosis not present

## 2022-11-30 DIAGNOSIS — D539 Nutritional anemia, unspecified: Secondary | ICD-10-CM | POA: Diagnosis not present

## 2022-11-30 DIAGNOSIS — E785 Hyperlipidemia, unspecified: Secondary | ICD-10-CM | POA: Diagnosis not present

## 2022-11-30 DIAGNOSIS — I10 Essential (primary) hypertension: Secondary | ICD-10-CM | POA: Diagnosis not present

## 2022-11-30 DIAGNOSIS — E538 Deficiency of other specified B group vitamins: Secondary | ICD-10-CM | POA: Diagnosis not present

## 2022-11-30 DIAGNOSIS — M5441 Lumbago with sciatica, right side: Secondary | ICD-10-CM | POA: Diagnosis not present

## 2022-11-30 DIAGNOSIS — E559 Vitamin D deficiency, unspecified: Secondary | ICD-10-CM | POA: Diagnosis not present

## 2022-11-30 DIAGNOSIS — R262 Difficulty in walking, not elsewhere classified: Secondary | ICD-10-CM | POA: Diagnosis not present

## 2022-11-30 DIAGNOSIS — M1990 Unspecified osteoarthritis, unspecified site: Secondary | ICD-10-CM | POA: Diagnosis not present

## 2022-11-30 DIAGNOSIS — R000 Tachycardia, unspecified: Secondary | ICD-10-CM | POA: Diagnosis not present

## 2022-11-30 DIAGNOSIS — F321 Major depressive disorder, single episode, moderate: Secondary | ICD-10-CM | POA: Diagnosis not present

## 2022-12-01 DIAGNOSIS — F02A4 Dementia in other diseases classified elsewhere, mild, with anxiety: Secondary | ICD-10-CM | POA: Diagnosis not present

## 2022-12-01 DIAGNOSIS — M1990 Unspecified osteoarthritis, unspecified site: Secondary | ICD-10-CM | POA: Diagnosis not present

## 2022-12-01 DIAGNOSIS — G311 Senile degeneration of brain, not elsewhere classified: Secondary | ICD-10-CM | POA: Diagnosis not present

## 2022-12-01 DIAGNOSIS — S81812D Laceration without foreign body, left lower leg, subsequent encounter: Secondary | ICD-10-CM | POA: Diagnosis not present

## 2022-12-01 DIAGNOSIS — I10 Essential (primary) hypertension: Secondary | ICD-10-CM | POA: Diagnosis not present

## 2022-12-01 DIAGNOSIS — Z87440 Personal history of urinary (tract) infections: Secondary | ICD-10-CM | POA: Diagnosis not present

## 2022-12-01 DIAGNOSIS — K219 Gastro-esophageal reflux disease without esophagitis: Secondary | ICD-10-CM | POA: Diagnosis not present

## 2022-12-01 DIAGNOSIS — S81811D Laceration without foreign body, right lower leg, subsequent encounter: Secondary | ICD-10-CM | POA: Diagnosis not present

## 2022-12-03 DIAGNOSIS — I10 Essential (primary) hypertension: Secondary | ICD-10-CM | POA: Diagnosis not present

## 2022-12-03 DIAGNOSIS — G311 Senile degeneration of brain, not elsewhere classified: Secondary | ICD-10-CM | POA: Diagnosis not present

## 2022-12-03 DIAGNOSIS — F02A4 Dementia in other diseases classified elsewhere, mild, with anxiety: Secondary | ICD-10-CM | POA: Diagnosis not present

## 2022-12-03 DIAGNOSIS — Z87440 Personal history of urinary (tract) infections: Secondary | ICD-10-CM | POA: Diagnosis not present

## 2022-12-03 DIAGNOSIS — K219 Gastro-esophageal reflux disease without esophagitis: Secondary | ICD-10-CM | POA: Diagnosis not present

## 2022-12-03 DIAGNOSIS — M1990 Unspecified osteoarthritis, unspecified site: Secondary | ICD-10-CM | POA: Diagnosis not present

## 2022-12-06 DIAGNOSIS — K219 Gastro-esophageal reflux disease without esophagitis: Secondary | ICD-10-CM | POA: Diagnosis not present

## 2022-12-06 DIAGNOSIS — G311 Senile degeneration of brain, not elsewhere classified: Secondary | ICD-10-CM | POA: Diagnosis not present

## 2022-12-06 DIAGNOSIS — F02A4 Dementia in other diseases classified elsewhere, mild, with anxiety: Secondary | ICD-10-CM | POA: Diagnosis not present

## 2022-12-06 DIAGNOSIS — I10 Essential (primary) hypertension: Secondary | ICD-10-CM | POA: Diagnosis not present

## 2022-12-06 DIAGNOSIS — Z87440 Personal history of urinary (tract) infections: Secondary | ICD-10-CM | POA: Diagnosis not present

## 2022-12-06 DIAGNOSIS — M1990 Unspecified osteoarthritis, unspecified site: Secondary | ICD-10-CM | POA: Diagnosis not present

## 2022-12-07 DIAGNOSIS — F02A4 Dementia in other diseases classified elsewhere, mild, with anxiety: Secondary | ICD-10-CM | POA: Diagnosis not present

## 2022-12-07 DIAGNOSIS — K219 Gastro-esophageal reflux disease without esophagitis: Secondary | ICD-10-CM | POA: Diagnosis not present

## 2022-12-07 DIAGNOSIS — Z87440 Personal history of urinary (tract) infections: Secondary | ICD-10-CM | POA: Diagnosis not present

## 2022-12-07 DIAGNOSIS — G311 Senile degeneration of brain, not elsewhere classified: Secondary | ICD-10-CM | POA: Diagnosis not present

## 2022-12-07 DIAGNOSIS — M1990 Unspecified osteoarthritis, unspecified site: Secondary | ICD-10-CM | POA: Diagnosis not present

## 2022-12-07 DIAGNOSIS — I10 Essential (primary) hypertension: Secondary | ICD-10-CM | POA: Diagnosis not present

## 2022-12-08 DIAGNOSIS — Z87440 Personal history of urinary (tract) infections: Secondary | ICD-10-CM | POA: Diagnosis not present

## 2022-12-08 DIAGNOSIS — S81812D Laceration without foreign body, left lower leg, subsequent encounter: Secondary | ICD-10-CM | POA: Diagnosis not present

## 2022-12-08 DIAGNOSIS — K219 Gastro-esophageal reflux disease without esophagitis: Secondary | ICD-10-CM | POA: Diagnosis not present

## 2022-12-08 DIAGNOSIS — I10 Essential (primary) hypertension: Secondary | ICD-10-CM | POA: Diagnosis not present

## 2022-12-08 DIAGNOSIS — F02A4 Dementia in other diseases classified elsewhere, mild, with anxiety: Secondary | ICD-10-CM | POA: Diagnosis not present

## 2022-12-08 DIAGNOSIS — G311 Senile degeneration of brain, not elsewhere classified: Secondary | ICD-10-CM | POA: Diagnosis not present

## 2022-12-08 DIAGNOSIS — S81811D Laceration without foreign body, right lower leg, subsequent encounter: Secondary | ICD-10-CM | POA: Diagnosis not present

## 2022-12-08 DIAGNOSIS — M1990 Unspecified osteoarthritis, unspecified site: Secondary | ICD-10-CM | POA: Diagnosis not present

## 2022-12-09 DIAGNOSIS — G311 Senile degeneration of brain, not elsewhere classified: Secondary | ICD-10-CM | POA: Diagnosis not present

## 2022-12-09 DIAGNOSIS — F02A4 Dementia in other diseases classified elsewhere, mild, with anxiety: Secondary | ICD-10-CM | POA: Diagnosis not present

## 2022-12-09 DIAGNOSIS — I10 Essential (primary) hypertension: Secondary | ICD-10-CM | POA: Diagnosis not present

## 2022-12-09 DIAGNOSIS — Z87440 Personal history of urinary (tract) infections: Secondary | ICD-10-CM | POA: Diagnosis not present

## 2022-12-09 DIAGNOSIS — M1990 Unspecified osteoarthritis, unspecified site: Secondary | ICD-10-CM | POA: Diagnosis not present

## 2022-12-09 DIAGNOSIS — K219 Gastro-esophageal reflux disease without esophagitis: Secondary | ICD-10-CM | POA: Diagnosis not present

## 2022-12-10 DIAGNOSIS — I10 Essential (primary) hypertension: Secondary | ICD-10-CM | POA: Diagnosis not present

## 2022-12-10 DIAGNOSIS — Z87440 Personal history of urinary (tract) infections: Secondary | ICD-10-CM | POA: Diagnosis not present

## 2022-12-10 DIAGNOSIS — F02A4 Dementia in other diseases classified elsewhere, mild, with anxiety: Secondary | ICD-10-CM | POA: Diagnosis not present

## 2022-12-10 DIAGNOSIS — G311 Senile degeneration of brain, not elsewhere classified: Secondary | ICD-10-CM | POA: Diagnosis not present

## 2022-12-10 DIAGNOSIS — M25512 Pain in left shoulder: Secondary | ICD-10-CM | POA: Diagnosis not present

## 2022-12-10 DIAGNOSIS — M1990 Unspecified osteoarthritis, unspecified site: Secondary | ICD-10-CM | POA: Diagnosis not present

## 2022-12-10 DIAGNOSIS — K219 Gastro-esophageal reflux disease without esophagitis: Secondary | ICD-10-CM | POA: Diagnosis not present

## 2022-12-13 DIAGNOSIS — G311 Senile degeneration of brain, not elsewhere classified: Secondary | ICD-10-CM | POA: Diagnosis not present

## 2022-12-13 DIAGNOSIS — F02A4 Dementia in other diseases classified elsewhere, mild, with anxiety: Secondary | ICD-10-CM | POA: Diagnosis not present

## 2022-12-13 DIAGNOSIS — M1990 Unspecified osteoarthritis, unspecified site: Secondary | ICD-10-CM | POA: Diagnosis not present

## 2022-12-13 DIAGNOSIS — K219 Gastro-esophageal reflux disease without esophagitis: Secondary | ICD-10-CM | POA: Diagnosis not present

## 2022-12-13 DIAGNOSIS — I10 Essential (primary) hypertension: Secondary | ICD-10-CM | POA: Diagnosis not present

## 2022-12-13 DIAGNOSIS — Z87440 Personal history of urinary (tract) infections: Secondary | ICD-10-CM | POA: Diagnosis not present

## 2022-12-14 DIAGNOSIS — K219 Gastro-esophageal reflux disease without esophagitis: Secondary | ICD-10-CM | POA: Diagnosis not present

## 2022-12-14 DIAGNOSIS — I10 Essential (primary) hypertension: Secondary | ICD-10-CM | POA: Diagnosis not present

## 2022-12-14 DIAGNOSIS — G311 Senile degeneration of brain, not elsewhere classified: Secondary | ICD-10-CM | POA: Diagnosis not present

## 2022-12-14 DIAGNOSIS — F02A4 Dementia in other diseases classified elsewhere, mild, with anxiety: Secondary | ICD-10-CM | POA: Diagnosis not present

## 2022-12-14 DIAGNOSIS — M1990 Unspecified osteoarthritis, unspecified site: Secondary | ICD-10-CM | POA: Diagnosis not present

## 2022-12-14 DIAGNOSIS — Z87440 Personal history of urinary (tract) infections: Secondary | ICD-10-CM | POA: Diagnosis not present

## 2022-12-15 DIAGNOSIS — F02A4 Dementia in other diseases classified elsewhere, mild, with anxiety: Secondary | ICD-10-CM | POA: Diagnosis not present

## 2022-12-15 DIAGNOSIS — S81811D Laceration without foreign body, right lower leg, subsequent encounter: Secondary | ICD-10-CM | POA: Diagnosis not present

## 2022-12-15 DIAGNOSIS — G311 Senile degeneration of brain, not elsewhere classified: Secondary | ICD-10-CM | POA: Diagnosis not present

## 2022-12-15 DIAGNOSIS — I10 Essential (primary) hypertension: Secondary | ICD-10-CM | POA: Diagnosis not present

## 2022-12-15 DIAGNOSIS — K219 Gastro-esophageal reflux disease without esophagitis: Secondary | ICD-10-CM | POA: Diagnosis not present

## 2022-12-15 DIAGNOSIS — Z87440 Personal history of urinary (tract) infections: Secondary | ICD-10-CM | POA: Diagnosis not present

## 2022-12-15 DIAGNOSIS — S81812D Laceration without foreign body, left lower leg, subsequent encounter: Secondary | ICD-10-CM | POA: Diagnosis not present

## 2022-12-15 DIAGNOSIS — E538 Deficiency of other specified B group vitamins: Secondary | ICD-10-CM | POA: Diagnosis not present

## 2022-12-15 DIAGNOSIS — M1990 Unspecified osteoarthritis, unspecified site: Secondary | ICD-10-CM | POA: Diagnosis not present

## 2022-12-19 DIAGNOSIS — Z87440 Personal history of urinary (tract) infections: Secondary | ICD-10-CM | POA: Diagnosis not present

## 2022-12-19 DIAGNOSIS — M5430 Sciatica, unspecified side: Secondary | ICD-10-CM | POA: Diagnosis not present

## 2022-12-19 DIAGNOSIS — J45909 Unspecified asthma, uncomplicated: Secondary | ICD-10-CM | POA: Diagnosis not present

## 2022-12-19 DIAGNOSIS — M1990 Unspecified osteoarthritis, unspecified site: Secondary | ICD-10-CM | POA: Diagnosis not present

## 2022-12-19 DIAGNOSIS — K219 Gastro-esophageal reflux disease without esophagitis: Secondary | ICD-10-CM | POA: Diagnosis not present

## 2022-12-19 DIAGNOSIS — G311 Senile degeneration of brain, not elsewhere classified: Secondary | ICD-10-CM | POA: Diagnosis not present

## 2022-12-19 DIAGNOSIS — G459 Transient cerebral ischemic attack, unspecified: Secondary | ICD-10-CM | POA: Diagnosis not present

## 2022-12-19 DIAGNOSIS — Z66 Do not resuscitate: Secondary | ICD-10-CM | POA: Diagnosis not present

## 2022-12-19 DIAGNOSIS — I10 Essential (primary) hypertension: Secondary | ICD-10-CM | POA: Diagnosis not present

## 2022-12-19 DIAGNOSIS — F02A4 Dementia in other diseases classified elsewhere, mild, with anxiety: Secondary | ICD-10-CM | POA: Diagnosis not present

## 2022-12-19 DIAGNOSIS — Z853 Personal history of malignant neoplasm of breast: Secondary | ICD-10-CM | POA: Diagnosis not present

## 2022-12-19 DIAGNOSIS — Z515 Encounter for palliative care: Secondary | ICD-10-CM | POA: Diagnosis not present

## 2022-12-20 DIAGNOSIS — K219 Gastro-esophageal reflux disease without esophagitis: Secondary | ICD-10-CM | POA: Diagnosis not present

## 2022-12-20 DIAGNOSIS — F02A4 Dementia in other diseases classified elsewhere, mild, with anxiety: Secondary | ICD-10-CM | POA: Diagnosis not present

## 2022-12-20 DIAGNOSIS — G311 Senile degeneration of brain, not elsewhere classified: Secondary | ICD-10-CM | POA: Diagnosis not present

## 2022-12-20 DIAGNOSIS — Z87440 Personal history of urinary (tract) infections: Secondary | ICD-10-CM | POA: Diagnosis not present

## 2022-12-20 DIAGNOSIS — M1990 Unspecified osteoarthritis, unspecified site: Secondary | ICD-10-CM | POA: Diagnosis not present

## 2022-12-20 DIAGNOSIS — I10 Essential (primary) hypertension: Secondary | ICD-10-CM | POA: Diagnosis not present

## 2022-12-21 DIAGNOSIS — G311 Senile degeneration of brain, not elsewhere classified: Secondary | ICD-10-CM | POA: Diagnosis not present

## 2022-12-21 DIAGNOSIS — F02A4 Dementia in other diseases classified elsewhere, mild, with anxiety: Secondary | ICD-10-CM | POA: Diagnosis not present

## 2022-12-21 DIAGNOSIS — M1990 Unspecified osteoarthritis, unspecified site: Secondary | ICD-10-CM | POA: Diagnosis not present

## 2022-12-21 DIAGNOSIS — Z87440 Personal history of urinary (tract) infections: Secondary | ICD-10-CM | POA: Diagnosis not present

## 2022-12-21 DIAGNOSIS — I10 Essential (primary) hypertension: Secondary | ICD-10-CM | POA: Diagnosis not present

## 2022-12-21 DIAGNOSIS — K219 Gastro-esophageal reflux disease without esophagitis: Secondary | ICD-10-CM | POA: Diagnosis not present

## 2022-12-22 DIAGNOSIS — F02A4 Dementia in other diseases classified elsewhere, mild, with anxiety: Secondary | ICD-10-CM | POA: Diagnosis not present

## 2022-12-22 DIAGNOSIS — G311 Senile degeneration of brain, not elsewhere classified: Secondary | ICD-10-CM | POA: Diagnosis not present

## 2022-12-22 DIAGNOSIS — I10 Essential (primary) hypertension: Secondary | ICD-10-CM | POA: Diagnosis not present

## 2022-12-22 DIAGNOSIS — K219 Gastro-esophageal reflux disease without esophagitis: Secondary | ICD-10-CM | POA: Diagnosis not present

## 2022-12-22 DIAGNOSIS — Z87440 Personal history of urinary (tract) infections: Secondary | ICD-10-CM | POA: Diagnosis not present

## 2022-12-22 DIAGNOSIS — M1990 Unspecified osteoarthritis, unspecified site: Secondary | ICD-10-CM | POA: Diagnosis not present

## 2022-12-24 DIAGNOSIS — F02A4 Dementia in other diseases classified elsewhere, mild, with anxiety: Secondary | ICD-10-CM | POA: Diagnosis not present

## 2022-12-24 DIAGNOSIS — K219 Gastro-esophageal reflux disease without esophagitis: Secondary | ICD-10-CM | POA: Diagnosis not present

## 2022-12-24 DIAGNOSIS — Z87440 Personal history of urinary (tract) infections: Secondary | ICD-10-CM | POA: Diagnosis not present

## 2022-12-24 DIAGNOSIS — G311 Senile degeneration of brain, not elsewhere classified: Secondary | ICD-10-CM | POA: Diagnosis not present

## 2022-12-24 DIAGNOSIS — M1990 Unspecified osteoarthritis, unspecified site: Secondary | ICD-10-CM | POA: Diagnosis not present

## 2022-12-24 DIAGNOSIS — I10 Essential (primary) hypertension: Secondary | ICD-10-CM | POA: Diagnosis not present

## 2022-12-28 DIAGNOSIS — F02A4 Dementia in other diseases classified elsewhere, mild, with anxiety: Secondary | ICD-10-CM | POA: Diagnosis not present

## 2022-12-28 DIAGNOSIS — I10 Essential (primary) hypertension: Secondary | ICD-10-CM | POA: Diagnosis not present

## 2022-12-28 DIAGNOSIS — K219 Gastro-esophageal reflux disease without esophagitis: Secondary | ICD-10-CM | POA: Diagnosis not present

## 2022-12-28 DIAGNOSIS — G311 Senile degeneration of brain, not elsewhere classified: Secondary | ICD-10-CM | POA: Diagnosis not present

## 2022-12-28 DIAGNOSIS — M1990 Unspecified osteoarthritis, unspecified site: Secondary | ICD-10-CM | POA: Diagnosis not present

## 2022-12-28 DIAGNOSIS — Z87440 Personal history of urinary (tract) infections: Secondary | ICD-10-CM | POA: Diagnosis not present

## 2022-12-29 DIAGNOSIS — M1990 Unspecified osteoarthritis, unspecified site: Secondary | ICD-10-CM | POA: Diagnosis not present

## 2022-12-29 DIAGNOSIS — Z87440 Personal history of urinary (tract) infections: Secondary | ICD-10-CM | POA: Diagnosis not present

## 2022-12-29 DIAGNOSIS — I10 Essential (primary) hypertension: Secondary | ICD-10-CM | POA: Diagnosis not present

## 2022-12-29 DIAGNOSIS — F02A4 Dementia in other diseases classified elsewhere, mild, with anxiety: Secondary | ICD-10-CM | POA: Diagnosis not present

## 2022-12-29 DIAGNOSIS — G311 Senile degeneration of brain, not elsewhere classified: Secondary | ICD-10-CM | POA: Diagnosis not present

## 2022-12-29 DIAGNOSIS — K219 Gastro-esophageal reflux disease without esophagitis: Secondary | ICD-10-CM | POA: Diagnosis not present

## 2022-12-30 DIAGNOSIS — G311 Senile degeneration of brain, not elsewhere classified: Secondary | ICD-10-CM | POA: Diagnosis not present

## 2022-12-30 DIAGNOSIS — M1990 Unspecified osteoarthritis, unspecified site: Secondary | ICD-10-CM | POA: Diagnosis not present

## 2022-12-30 DIAGNOSIS — F02A4 Dementia in other diseases classified elsewhere, mild, with anxiety: Secondary | ICD-10-CM | POA: Diagnosis not present

## 2022-12-30 DIAGNOSIS — K219 Gastro-esophageal reflux disease without esophagitis: Secondary | ICD-10-CM | POA: Diagnosis not present

## 2022-12-30 DIAGNOSIS — I10 Essential (primary) hypertension: Secondary | ICD-10-CM | POA: Diagnosis not present

## 2022-12-30 DIAGNOSIS — Z87440 Personal history of urinary (tract) infections: Secondary | ICD-10-CM | POA: Diagnosis not present

## 2022-12-31 DIAGNOSIS — K219 Gastro-esophageal reflux disease without esophagitis: Secondary | ICD-10-CM | POA: Diagnosis not present

## 2022-12-31 DIAGNOSIS — I10 Essential (primary) hypertension: Secondary | ICD-10-CM | POA: Diagnosis not present

## 2022-12-31 DIAGNOSIS — F02A4 Dementia in other diseases classified elsewhere, mild, with anxiety: Secondary | ICD-10-CM | POA: Diagnosis not present

## 2022-12-31 DIAGNOSIS — M1990 Unspecified osteoarthritis, unspecified site: Secondary | ICD-10-CM | POA: Diagnosis not present

## 2022-12-31 DIAGNOSIS — G311 Senile degeneration of brain, not elsewhere classified: Secondary | ICD-10-CM | POA: Diagnosis not present

## 2022-12-31 DIAGNOSIS — Z87440 Personal history of urinary (tract) infections: Secondary | ICD-10-CM | POA: Diagnosis not present

## 2023-01-05 DIAGNOSIS — F02A4 Dementia in other diseases classified elsewhere, mild, with anxiety: Secondary | ICD-10-CM | POA: Diagnosis not present

## 2023-01-05 DIAGNOSIS — K219 Gastro-esophageal reflux disease without esophagitis: Secondary | ICD-10-CM | POA: Diagnosis not present

## 2023-01-05 DIAGNOSIS — Z87440 Personal history of urinary (tract) infections: Secondary | ICD-10-CM | POA: Diagnosis not present

## 2023-01-05 DIAGNOSIS — G311 Senile degeneration of brain, not elsewhere classified: Secondary | ICD-10-CM | POA: Diagnosis not present

## 2023-01-05 DIAGNOSIS — M1990 Unspecified osteoarthritis, unspecified site: Secondary | ICD-10-CM | POA: Diagnosis not present

## 2023-01-05 DIAGNOSIS — I10 Essential (primary) hypertension: Secondary | ICD-10-CM | POA: Diagnosis not present

## 2023-01-07 DIAGNOSIS — Z87440 Personal history of urinary (tract) infections: Secondary | ICD-10-CM | POA: Diagnosis not present

## 2023-01-07 DIAGNOSIS — F02A4 Dementia in other diseases classified elsewhere, mild, with anxiety: Secondary | ICD-10-CM | POA: Diagnosis not present

## 2023-01-07 DIAGNOSIS — G311 Senile degeneration of brain, not elsewhere classified: Secondary | ICD-10-CM | POA: Diagnosis not present

## 2023-01-07 DIAGNOSIS — M1990 Unspecified osteoarthritis, unspecified site: Secondary | ICD-10-CM | POA: Diagnosis not present

## 2023-01-07 DIAGNOSIS — K219 Gastro-esophageal reflux disease without esophagitis: Secondary | ICD-10-CM | POA: Diagnosis not present

## 2023-01-07 DIAGNOSIS — I10 Essential (primary) hypertension: Secondary | ICD-10-CM | POA: Diagnosis not present

## 2023-01-10 DIAGNOSIS — M1990 Unspecified osteoarthritis, unspecified site: Secondary | ICD-10-CM | POA: Diagnosis not present

## 2023-01-10 DIAGNOSIS — K219 Gastro-esophageal reflux disease without esophagitis: Secondary | ICD-10-CM | POA: Diagnosis not present

## 2023-01-10 DIAGNOSIS — F02A4 Dementia in other diseases classified elsewhere, mild, with anxiety: Secondary | ICD-10-CM | POA: Diagnosis not present

## 2023-01-10 DIAGNOSIS — Z87440 Personal history of urinary (tract) infections: Secondary | ICD-10-CM | POA: Diagnosis not present

## 2023-01-10 DIAGNOSIS — G311 Senile degeneration of brain, not elsewhere classified: Secondary | ICD-10-CM | POA: Diagnosis not present

## 2023-01-10 DIAGNOSIS — I10 Essential (primary) hypertension: Secondary | ICD-10-CM | POA: Diagnosis not present

## 2023-01-11 DIAGNOSIS — K219 Gastro-esophageal reflux disease without esophagitis: Secondary | ICD-10-CM | POA: Diagnosis not present

## 2023-01-11 DIAGNOSIS — I10 Essential (primary) hypertension: Secondary | ICD-10-CM | POA: Diagnosis not present

## 2023-01-11 DIAGNOSIS — F02A4 Dementia in other diseases classified elsewhere, mild, with anxiety: Secondary | ICD-10-CM | POA: Diagnosis not present

## 2023-01-11 DIAGNOSIS — M1990 Unspecified osteoarthritis, unspecified site: Secondary | ICD-10-CM | POA: Diagnosis not present

## 2023-01-11 DIAGNOSIS — Z87440 Personal history of urinary (tract) infections: Secondary | ICD-10-CM | POA: Diagnosis not present

## 2023-01-11 DIAGNOSIS — G311 Senile degeneration of brain, not elsewhere classified: Secondary | ICD-10-CM | POA: Diagnosis not present

## 2023-01-17 DIAGNOSIS — N3281 Overactive bladder: Secondary | ICD-10-CM | POA: Diagnosis not present

## 2023-01-17 DIAGNOSIS — R300 Dysuria: Secondary | ICD-10-CM | POA: Diagnosis not present

## 2023-01-17 DIAGNOSIS — N390 Urinary tract infection, site not specified: Secondary | ICD-10-CM | POA: Diagnosis not present

## 2023-01-19 DIAGNOSIS — K219 Gastro-esophageal reflux disease without esophagitis: Secondary | ICD-10-CM | POA: Diagnosis not present

## 2023-01-19 DIAGNOSIS — G459 Transient cerebral ischemic attack, unspecified: Secondary | ICD-10-CM | POA: Diagnosis not present

## 2023-01-19 DIAGNOSIS — Z853 Personal history of malignant neoplasm of breast: Secondary | ICD-10-CM | POA: Diagnosis not present

## 2023-01-19 DIAGNOSIS — I10 Essential (primary) hypertension: Secondary | ICD-10-CM | POA: Diagnosis not present

## 2023-01-19 DIAGNOSIS — J45909 Unspecified asthma, uncomplicated: Secondary | ICD-10-CM | POA: Diagnosis not present

## 2023-01-19 DIAGNOSIS — Z87440 Personal history of urinary (tract) infections: Secondary | ICD-10-CM | POA: Diagnosis not present

## 2023-01-19 DIAGNOSIS — G311 Senile degeneration of brain, not elsewhere classified: Secondary | ICD-10-CM | POA: Diagnosis not present

## 2023-01-19 DIAGNOSIS — Z66 Do not resuscitate: Secondary | ICD-10-CM | POA: Diagnosis not present

## 2023-01-19 DIAGNOSIS — Z515 Encounter for palliative care: Secondary | ICD-10-CM | POA: Diagnosis not present

## 2023-01-19 DIAGNOSIS — M5430 Sciatica, unspecified side: Secondary | ICD-10-CM | POA: Diagnosis not present

## 2023-01-19 DIAGNOSIS — M1990 Unspecified osteoarthritis, unspecified site: Secondary | ICD-10-CM | POA: Diagnosis not present

## 2023-01-19 DIAGNOSIS — F02A4 Dementia in other diseases classified elsewhere, mild, with anxiety: Secondary | ICD-10-CM | POA: Diagnosis not present

## 2023-01-20 DIAGNOSIS — E538 Deficiency of other specified B group vitamins: Secondary | ICD-10-CM | POA: Diagnosis not present

## 2023-01-21 DIAGNOSIS — M1711 Unilateral primary osteoarthritis, right knee: Secondary | ICD-10-CM | POA: Diagnosis not present

## 2023-01-21 DIAGNOSIS — M170 Bilateral primary osteoarthritis of knee: Secondary | ICD-10-CM | POA: Diagnosis not present

## 2023-01-21 DIAGNOSIS — M1990 Unspecified osteoarthritis, unspecified site: Secondary | ICD-10-CM | POA: Diagnosis not present

## 2023-01-21 DIAGNOSIS — I10 Essential (primary) hypertension: Secondary | ICD-10-CM | POA: Diagnosis not present

## 2023-01-21 DIAGNOSIS — M1712 Unilateral primary osteoarthritis, left knee: Secondary | ICD-10-CM | POA: Diagnosis not present

## 2023-01-21 DIAGNOSIS — F02A4 Dementia in other diseases classified elsewhere, mild, with anxiety: Secondary | ICD-10-CM | POA: Diagnosis not present

## 2023-01-21 DIAGNOSIS — Z87440 Personal history of urinary (tract) infections: Secondary | ICD-10-CM | POA: Diagnosis not present

## 2023-01-21 DIAGNOSIS — K219 Gastro-esophageal reflux disease without esophagitis: Secondary | ICD-10-CM | POA: Diagnosis not present

## 2023-01-21 DIAGNOSIS — G311 Senile degeneration of brain, not elsewhere classified: Secondary | ICD-10-CM | POA: Diagnosis not present

## 2023-01-23 NOTE — Progress Notes (Deleted)
 [facility]  [address] [place],  [place]  [address] [phone]  Clinic Day:  10/07/2022  Referring physician: Erick Greig LABOR, NP  HISTORY OF PRESENT ILLNESS:  The patient is an 83 y.o. 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  She is currently taking letrozole  for her adjuvant endocrine therapy.  She comes in today for routine follow-up.  Since her last visit, the patient has been doing well.  She denies noticing any masses or other findings which concern her for early disease recurrence.  Of note, her annual mammogram in July 2024 showed no evidence of disease recurrence.   PHYSICAL EXAM:  There were no vitals taken for this visit. Wt Readings from Last 3 Encounters:  10/07/22 167 lb 3.2 oz (75.8 kg)  08/23/22 169 lb (76.7 kg)  06/22/22 167 lb (75.8 kg)   There is no height or weight on file to calculate BMI. Performance status (ECOG): 1 - Symptomatic but completely ambulatory Physical Exam Constitutional:      Appearance: Normal appearance.  HENT:     Mouth/Throat:     Pharynx: Oropharynx is clear. No oropharyngeal exudate.  Cardiovascular:     Rate and Rhythm: Normal rate and regular rhythm.     Heart sounds: No murmur heard.    No friction rub. No gallop.  Pulmonary:     Breath sounds: Normal breath sounds.  Chest:  Breasts:    Right: No swelling, bleeding, inverted nipple, mass, nipple discharge or skin change.     Left: No swelling, bleeding, inverted nipple, mass, nipple discharge or skin change.  Abdominal:     General: Bowel sounds are normal. There is no distension.     Palpations: Abdomen is soft. There is no mass.     Tenderness: There is no abdominal tenderness.  Musculoskeletal:        General: No tenderness.     Cervical back: Normal range of motion and neck supple.     Right lower leg: No edema.     Left lower leg: No edema.  Lymphadenopathy:      Cervical: No cervical adenopathy.     Right cervical: No superficial, deep or posterior cervical adenopathy.    Left cervical: No superficial, deep or posterior cervical adenopathy.     Upper Body:     Right upper body: No supraclavicular or axillary adenopathy.     Left upper body: No supraclavicular or axillary adenopathy.     Lower Body: No right inguinal adenopathy. No left inguinal adenopathy.  Skin:    Coloration: Skin is not jaundiced.     Findings: No lesion or rash.  Neurological:     General: No focal deficit present.     Mental Status: She is alert and oriented to person, place, and time. Mental status is at baseline.  Psychiatric:        Mood and Affect: Mood normal.        Behavior: Behavior normal.        Thought Content: Thought content normal.        Judgment: Judgment normal.   ASSESSMENT & PLAN:  An 83 y.o. 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  Based upon her clinical breast exam today and recent mammogram, the patient remains disease-free.  She knows to continue taking her letrozole  on a  daily basis for 5 years of adjuvant endocrine therapy.  I will see her back in 4 months for her next clinical breast exam.  The patient understands all the plans discussed today and is in agreement with them.  Ayoub Arey DELENA Kerns, MD

## 2023-01-24 ENCOUNTER — Inpatient Hospital Stay: Payer: Medicare Other | Admitting: Oncology

## 2023-01-25 DIAGNOSIS — M1990 Unspecified osteoarthritis, unspecified site: Secondary | ICD-10-CM | POA: Diagnosis not present

## 2023-01-25 DIAGNOSIS — I10 Essential (primary) hypertension: Secondary | ICD-10-CM | POA: Diagnosis not present

## 2023-01-25 DIAGNOSIS — F02A4 Dementia in other diseases classified elsewhere, mild, with anxiety: Secondary | ICD-10-CM | POA: Diagnosis not present

## 2023-01-25 DIAGNOSIS — G311 Senile degeneration of brain, not elsewhere classified: Secondary | ICD-10-CM | POA: Diagnosis not present

## 2023-01-25 DIAGNOSIS — Z87440 Personal history of urinary (tract) infections: Secondary | ICD-10-CM | POA: Diagnosis not present

## 2023-01-25 DIAGNOSIS — K219 Gastro-esophageal reflux disease without esophagitis: Secondary | ICD-10-CM | POA: Diagnosis not present

## 2023-02-06 NOTE — Progress Notes (Unsigned)
[facility]  [address] [place],  [place]  [address] [phone]  Clinic Day:  10/07/2022  Referring physician: Hurshel Party, NP  HISTORY OF PRESENT ILLNESS:  The patient is an 83 y.o. 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  She is currently taking letrozole for her adjuvant endocrine therapy.  She comes in today for routine follow-up.  Since her last visit, the patient has been doing well.  She denies noticing any masses or other findings which concern her for early disease recurrence.  Of note, her annual mammogram in July 2024 showed no evidence of disease recurrence.   PHYSICAL EXAM:  There were no vitals taken for this visit. Wt Readings from Last 3 Encounters:  10/07/22 167 lb 3.2 oz (75.8 kg)  08/23/22 169 lb (76.7 kg)  06/22/22 167 lb (75.8 kg)   There is no height or weight on file to calculate BMI. Performance status (ECOG): 1 - Symptomatic but completely ambulatory Physical Exam Constitutional:      Appearance: Normal appearance.  HENT:     Mouth/Throat:     Pharynx: Oropharynx is clear. No oropharyngeal exudate.  Cardiovascular:     Rate and Rhythm: Normal rate and regular rhythm.     Heart sounds: No murmur heard.    No friction rub. No gallop.  Pulmonary:     Breath sounds: Normal breath sounds.  Chest:  Breasts:    Right: No swelling, bleeding, inverted nipple, mass, nipple discharge or skin change.     Left: No swelling, bleeding, inverted nipple, mass, nipple discharge or skin change.  Abdominal:     General: Bowel sounds are normal. There is no distension.     Palpations: Abdomen is soft. There is no mass.     Tenderness: There is no abdominal tenderness.  Musculoskeletal:        General: No tenderness.     Cervical back: Normal range of motion and neck supple.     Right lower leg: No edema.     Left lower leg: No edema.  Lymphadenopathy:      Cervical: No cervical adenopathy.     Right cervical: No superficial, deep or posterior cervical adenopathy.    Left cervical: No superficial, deep or posterior cervical adenopathy.     Upper Body:     Right upper body: No supraclavicular or axillary adenopathy.     Left upper body: No supraclavicular or axillary adenopathy.     Lower Body: No right inguinal adenopathy. No left inguinal adenopathy.  Skin:    Coloration: Skin is not jaundiced.     Findings: No lesion or rash.  Neurological:     General: No focal deficit present.     Mental Status: She is alert and oriented to person, place, and time. Mental status is at baseline.  Psychiatric:        Mood and Affect: Mood normal.        Behavior: Behavior normal.        Thought Content: Thought content normal.        Judgment: Judgment normal.   ASSESSMENT & PLAN:  An 83 y.o. 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  Based upon her clinical breast exam today and recent mammogram, the patient remains disease-free.  She knows to continue taking her letrozole on a  daily basis for 5 years of adjuvant endocrine therapy.  I will see her back in 4 months for her next clinical breast exam.  The patient understands all the plans discussed today and is in agreement with them.  Melinda Hartman Kirby Funk, MD

## 2023-02-07 ENCOUNTER — Other Ambulatory Visit: Payer: Self-pay | Admitting: Oncology

## 2023-02-07 ENCOUNTER — Inpatient Hospital Stay: Payer: Medicare Other | Attending: Oncology | Admitting: Oncology

## 2023-02-07 VITALS — BP 130/69 | HR 102 | Resp 20 | Ht 63.0 in | Wt 161.8 lb

## 2023-02-07 DIAGNOSIS — Z170 Estrogen receptor positive status [ER+]: Secondary | ICD-10-CM | POA: Insufficient documentation

## 2023-02-07 DIAGNOSIS — R197 Diarrhea, unspecified: Secondary | ICD-10-CM | POA: Diagnosis not present

## 2023-02-07 DIAGNOSIS — Z79811 Long term (current) use of aromatase inhibitors: Secondary | ICD-10-CM | POA: Diagnosis not present

## 2023-02-07 DIAGNOSIS — C50412 Malignant neoplasm of upper-outer quadrant of left female breast: Secondary | ICD-10-CM | POA: Diagnosis not present

## 2023-02-07 DIAGNOSIS — C50912 Malignant neoplasm of unspecified site of left female breast: Secondary | ICD-10-CM | POA: Diagnosis not present

## 2023-02-07 MED ORDER — DIPHENOXYLATE-ATROPINE 2.5-0.025 MG PO TABS: 2.0000 | ORAL_TABLET | Freq: Four times a day (QID) | ORAL | 0 refills | Status: AC | PRN

## 2023-02-11 ENCOUNTER — Other Ambulatory Visit: Payer: Self-pay

## 2023-02-23 ENCOUNTER — Ambulatory Visit (INDEPENDENT_AMBULATORY_CARE_PROVIDER_SITE_OTHER): Payer: Medicare Other | Admitting: Physician Assistant

## 2023-02-23 ENCOUNTER — Encounter: Payer: Self-pay | Admitting: Physician Assistant

## 2023-02-23 VITALS — BP 125/73 | HR 95 | Resp 20 | Ht 63.0 in | Wt 165.0 lb

## 2023-02-23 DIAGNOSIS — R413 Other amnesia: Secondary | ICD-10-CM

## 2023-02-23 MED ORDER — MEMANTINE HCL 5 MG PO TABS: 5.0000 mg | ORAL_TABLET | Freq: Two times a day (BID) | ORAL | 3 refills | Status: DC

## 2023-02-23 NOTE — Progress Notes (Signed)
 Assessment/Plan:   Dementia likely due to multiple etiologies  Melinda Hartman is a very pleasant 83 y.o. RH female with a history of hypertension, hyperlipidemia, stage Ia left breast cancer on letrozole , recurrent UTIs, anxiety, depression, asthma, history of remote TIA in [place]  (2010), anemia, vitamin D  deficiency, vitamin B12 deficiency, arthritis with chronic low back pain, GERD,  seen today in follow up for memory loss. Patient is currently on memantine  5 mg twice daily.  Memory is stable.  MMSE today is 27/30.  She is limited on her ADLs due to chronic pain issues.  She does not drive but her husband is considering letting her drive very short distances with him as a copilot as he feels that she may be doing better than before.   Follow up in 6  months. Continue memantine  5 mg twice daily, side effects discussed (patient has a history of chronic leg cramps and chronic diarrhea) Patient is scheduled for repeat neuropsych evaluation March 2025 for diagnostic clarity and disease trajectory Recommend good control of her cardiovascular risk factors Continue to control mood as per PCP Monitor driving     Subjective:    This patient is accompanied in the office by her husband  who supplements the history.  Previous records as well as any outside records available were reviewed prior to todays visit. Patient was last seen on follow-up 8/ 2024.  MoCA on 06/22/2022 was 13/30   Any changes in memory since last visit?  Not worse.  She may be doing better-husband says. She has trouble with names even of people that she knows, and recent conversations, but he feels that this has improved. She recognizes her husband was before she would not .  She does not enjoy doing any brain games.  She enjoys watching TV, especially game shows and Westerns.   repeats oneself?  Endorsed Disoriented when walking into a room?  Patient denies   Leaving objects?  May misplace things but not in unusual  places. I lose stuff and can't find it then.   Wandering behavior?  denies   Any personality changes since last visit?  Denies.   Any worsening depression?: Mood is stable.  Since 1972 she has history of depression and severe GAD  Hallucinations or paranoia?  Sometimes she thinks that her parents leaving her house and asks what they are. She may think there is someone living upstairs. Sometimes she hears Ber[place] Recent seizures? denies.  Any sleep changes?  Sleeps well. Denies vivid dreams, REM behavior or sleepwalking   Sleep apnea?   Denies.   Any hygiene concerns? Denies.  Independent of bathing and dressing?  Endorsed  Does the patient needs help with medications?  Husband is in charge   Who is in charge of the finances?  Husband is in charge     Any changes in appetite?   It is good . She drinks plenty water.    Patient have trouble swallowing?  She has a history of GERD, has undergone esophageal dilatation. Does the patient cook? No, after she was forgetting, recipes. Any headaches?  After she fell 1 year ago, she has intermittent headaches in the frontal area and takes Tylenol  when needed. Chronic back pain endorsed, she had major back surgeries several times. Ambulates with difficulty?  Endorsed, she has chronic gait disturbance, chronic back pain with weakness.  She tried PT at home.  She takes nonopioid and opioid analgesics.  She has been seen by neurosurgery in the past.  Recent falls or head injuries?  She uses a walker to prevent falls.  Unilateral weakness, numbness or tingling? denies   Any tremors?  Denies   Any anosmia?  Denies   Any incontinence of urine?  She has a history of OAB and chronic cystitis due to decreased UO, followed by urology.  In the past she tried Botox. Any bowel dysfunction?  Occasional diarrhea.      Patient lives with her husband  Does the patient drive? She is to drive very short distances with her husband by her side I feel that she may be ready  to drive again -he says.   Initial visit 06/22/2022 How long did patient have memory difficulties?  Patient reports that for the last year she has been noticing changes in her memory.  Her husband states that these changes have been worse since sustaining a mechanical fall in February 2024, hitting her face in the left side of her head .  She did not lose consciousness at that time.  She reports not remembering people's names, even those that she knows, sometimes calling her husband daddy.  having trouble finding things around the house, or remembering a conversation.  She continues to be as active as I can , enjoying yard work and other outdoor activities although not as often as before due to mobility issues.  She does not enjoy doing crossword puzzles, word finding.  She enjoys watching TV game shows.  repeats oneself?  Endorsed Disoriented when walking into a room?  Patient denies  Leaving objects in unusual places?   denies   Wandering behavior? denies   Any personality changes ? denies   Any history of depression?: Endorsed since 1972. She has severe GAD.  Hallucinations or paranoia? Endorsed.  Sometimes she thinks that her parents are sleeping in her house and ask where they are .  Seizures?  Patient denies, but husband reports that these may have been several decades ago, and only 1 episode, while in [place] .  No recurrence. Any sleep changes?  Sleeps fairly well.  Denies vivid dreams, REM behavior or sleepwalking   Sleep apnea? denies   Any hygiene concerns?  denies   Independent of bathing and dressing?  Endorsed  Does the patient need help with medications?  Husband is in charge for the last 4 weeks as she was forgetting to take them  Who is in charge of the finances?  Husband  is in charge    Any changes in appetite?  Not as good as before. Drinks plenty water      Patient have trouble swallowing?  Had esophageal dilatation  Does the patient cook?  Not right now. Forgets  common recipes   Any kitchen accidents such as leaving the stove on? Patient denies   Any headaches? Since falling she has some headaches in the frontal area, worse in the morning. Takes tylenol  3-4/day and tramadol occasionally. Chronic back pain?  Endorsed, had major back surgery several times Ambulates with difficulty?  Endorsed, the patient has chronic gait disturbance, with pain in chronic back pain and weakness.  She tried PT at home, and she is taking nonopioid and opioid analgesics.  She has had some recent falls, stating that the legs give up on her .  Of note, per chart report she has chronic low back pain with bilateral sciatica. Been seen by neurosurgery in the past.  Recent falls or head injuries? Endorsed. Denies LOC. Legs gave out.  She has been doing physical  therapy, and uses the walker. Vision changes? Denies  Unilateral weakness, numbness or tingling?  denies   Any tremors?  denies   Any anosmia?  denies   Any incontinence of urine?  She has a history of OAB, and chronic cystitis with decreased UO. Urology has followed in the past , but she was in a lot of meds, she went to [facility] and placed her on antibiotics that did not work, then saw a PA in urogynecology, was on oxybutynin  but had to discontinue it due to memory concerns, most recently tried Botox 3 weeks ago with nitrofurantoin.   Any bowel dysfunction? denies      Patient lives with her husband.    History of heavy alcohol intake? denies   History of heavy tobacco use? denies   Family history of dementia?   2 sisters  had dementia, one possibly with AD   Does patient drive? No   MRI of the brain 2024, personally reviewed, remarkable for advanced chronic small vessel ischemic disease within the cerebral white matter, as well as chronic small vessel disease and changes in the basal ganglia and pons as well as mild to moderate for age generalized cerebral atrophy, no acute findings.   PREVIOUS MEDICATIONS:   CURRENT  MEDICATIONS:  Outpatient Encounter Medications as of 02/23/2023  Medication Sig   acetaminophen  (TYLENOL ) 325 MG tablet Take 325 mg by mouth every 6 (six) hours as needed for moderate pain (pain score 4-6).   albuterol  (PROVENTIL ) (2.5 MG/3ML) 0.083% nebulizer solution Take 2.5 mg by nebulization every 6 (six) hours as needed.   albuterol  (VENTOLIN  HFA) 108 (90 Base) MCG/ACT inhaler Inhale into the lungs every 6 (six) hours as needed.   aspirin EC 81 MG tablet Take 81 mg by mouth daily.    Calcium Carbonate-Vitamin D  (CALTRATE 600+D PO) Take by mouth.   celecoxib (CELEBREX) 100 MG capsule Take 100 mg by mouth 2 (two) times daily.   clonazePAM  (KLONOPIN ) 0.5 MG tablet Take 1 mg by mouth 2 (two) times daily.    diclofenac (CATAFLAM) 50 MG tablet Take 50 mg by mouth 3 (three) times daily.   diphenoxylate -atropine  (LOMOTIL ) 2.5-0.025 MG tablet Take 2 tablets by mouth 4 (four) times daily as needed for diarrhea or loose stools.   DULoxetine  (CYMBALTA ) 30 MG capsule Take 30 mg by mouth daily.   esomeprazole (NEXIUM) 40 MG capsule Take 40 mg by mouth 2 (two) times daily.   estradiol (ESTRACE) 0.1 MG/GM vaginal cream Place 1 Applicatorful vaginally at bedtime.   ezetimibe  (ZETIA ) 10 MG tablet Take 10 mg by mouth daily.   fluticasone -salmeterol (ADVAIR) 250-50 MCG/ACT AEPB Inhale 1 puff into the lungs in the morning and at bedtime.   fosfomycin (MONUROL) 3 g PACK Take 3 g by mouth once a week.   gabapentin  (NEURONTIN ) 300 MG capsule Take 300 mg by mouth 3 (three) times daily.   nitroGLYCERIN  (NITROSTAT ) 0.4 MG SL tablet Place 0.4 mg under the tongue every 5 (five) minutes as needed for chest pain.    polyethylene glycol (MIRALAX / GLYCOLAX) packet Take 17 g by mouth daily as needed for mild constipation.    potassium chloride  SA (K-DUR,KLOR-CON ) 20 MEQ tablet Take 20 mEq by mouth daily.    telmisartan (MICARDIS) 40 MG tablet Take 40 mg by mouth daily.   traMADol (ULTRAM) 50 MG tablet Take 50 mg by mouth  every 12 (twelve) hours as needed for moderate pain.    [DISCONTINUED] memantine  (NAMENDA ) 5 MG tablet Take  5 mg by mouth 2 (two) times daily.   memantine  (NAMENDA ) 5 MG tablet Take 1 tablet (5 mg total) by mouth 2 (two) times daily.   No facility-administered encounter medications on file as of 02/23/2023.       02/23/2023   12:00 PM  MMSE - Mini Mental State Exam  Orientation to time 3  Orientation to Place 5  Registration 3  Attention/ Calculation 5  Recall 2  Language- name 2 objects 2  Language- repeat 1  Language- follow 3 step command 3  Language- read & follow direction 1  Write a sentence 1  Copy design 1  Total score 27      06/22/2022   11:00 AM  Montreal Cognitive Assessment   Visuospatial/ Executive (0/5) 1  Naming (0/3) 3  Attention: Read list of digits (0/2) 2  Attention: Read list of letters (0/1) 1  Attention: Serial 7 subtraction starting at 100 (0/3) 0  Language: Repeat phrase (0/2) 0  Language : Fluency (0/1) 0  Abstraction (0/2) 1  Delayed Recall (0/5) 0  Orientation (0/6) 4  Total 12  Adjusted Score (based on education) 13    Objective:     PHYSICAL EXAMINATION:    VITALS:   Vitals:   02/23/23 1107  BP: 125/73  Pulse: 95  Resp: 20  SpO2: 97%  Weight: 165 lb (74.8 kg)  Height: 5' 3 (1.6 m)    GEN:  The patient appears stated age and is in NAD. HEENT:  Normocephalic, atraumatic.   Neurological examination:  General: NAD, well-groomed, appears stated age. Orientation: The patient is alert. Oriented to person, place and not to date Cranial nerves: There is good facial symmetry.The speech is fluent and clear. No aphasia or dysarthria. Fund of knowledge is appropriate. Recent and remote memory are impaired. Attention and concentration are reduced.  Able to name objects and repeat phrases.  Hearing is decreased to conversational tone, uses hearing aids.   Sensation: Sensation is intact to light touch throughout Motor: Strength is at least  antigravity x4. DTR's 2/4 in UE/LE     Movement examination: Tone: There is normal tone in the UE/LE Abnormal movements:  no tremor.  No myoclonus.  No asterixis.   Coordination:  There is no decremation with RAM's. Normal finger to nose  Gait and Station: The patient has some difficulty arising out of a deep-seated chair without the use of the hands.  Uses a walker to ambulate. The patient's stride length is good, slow.  Gait is cautious and narrow.    Thank you for allowing us  the opportunity to participate in the care of this nice patient. Please do not hesitate to contact us  for any questions or concerns.   Total time spent on today's visit was 30 minutes dedicated to this patient today, preparing to see patient, examining the patient, ordering tests and/or medications and counseling the patient, documenting clinical information in the EHR or other health record, independently interpreting results and communicating results to the patient/family, discussing treatment and goals, answering patient's questions and coordinating care.  Cc:  Erick Greig LABOR, NP  Camie Sevin 02/23/2023 12:21 PM

## 2023-02-23 NOTE — Patient Instructions (Signed)
 It was a pleasure to see you today at our office.   Recommendations:  Neurocognitive evaluation at our office in March 2025  Continue Memantine   5mg  twice a day.    Continue your anxiety pill  Follow up in  6  months     For assessment of decision of mental capacity and competency:  Call Dr. Rosaline Nine, geriatric psychiatrist at [phone] Counseling regarding caregiver distress, including caregiver depression, anxiety and issues regarding community resources, adult day care programs, adult living facilities, or memory care questions:  please contact your  Primary Doctor's Social Worker  Whom to call: Memory  decline, memory medications: Call our office [phone]  For psychiatric meds, mood meds: Please have your primary care physician manage these medications.  If you have any severe symptoms of a stroke, or other severe issues such as confusion,severe chills or fever, etc call 911 or go to the ER as you may need to be evaluated further    RECOMMENDATIONS FOR ALL PATIENTS WITH MEMORY PROBLEMS: 1. Continue to exercise (Recommend 30 minutes of walking everyday, or 3 hours every week) 2. Increase social interactions - continue going to [place] and enjoy social gatherings with friends and family 3. Eat healthy, avoid fried foods and eat more fruits and vegetables 4. Maintain adequate blood pressure, blood sugar, and blood cholesterol level. Reducing the risk of stroke and cardiovascular disease also helps promoting better memory. 5. Avoid stressful situations. Live a simple life and avoid aggravations. Organize your time and prepare for the next day in anticipation. 6. Sleep well, avoid any interruptions of sleep and avoid any distractions in the bedroom that may interfere with adequate sleep quality 7. Avoid sugar, avoid sweets as there is a strong link between excessive sugar intake, diabetes, and cognitive impairment We discussed the Mediterranean diet, which has been shown to  help patients reduce the risk of progressive memory disorders and reduces cardiovascular risk. This includes eating fish, eat fruits and green leafy vegetables, nuts like almonds and hazelnuts, walnuts, and also use olive oil. Avoid fast foods and fried foods as much as possible. Avoid sweets and sugar as sugar use has been linked to worsening of memory function.  There is always a concern of gradual progression of memory problems. If this is the case, then we may need to adjust level of care according to patient needs. Support, both to the patient and caregiver, should then be put into place.      You have been referred for a neuropsychological evaluation (i.e., evaluation of memory and thinking abilities). Please bring someone with you to this appointment if possible, as it is helpful for the doctor to hear from both you and another adult who knows you well. Please bring eyeglasses and hearing aids if you wear them.    The evaluation will take approximately 3 hours and has two parts:   The first part is a clinical interview with the neuropsychologist (Dr. Richie or Dr. Jackquline). During the interview, the neuropsychologist will speak with you and the individual you brought to the appointment.    The second part of the evaluation is testing with the doctor's technician Neal or Luke). During the testing, the technician will ask you to remember different types of material, solve problems, and answer some questionnaires. Your family member will not be present for this portion of the evaluation.   Please note: We must reserve several hours of the neuropsychologist's time and the psychometrician's time for your evaluation appointment. As  such, there is a No-Show fee of $100. If you are unable to attend any of your appointments, please contact our office as soon as possible to reschedule.    FALL PRECAUTIONS: Be cautious when walking. Scan the area for obstacles that may increase the risk of trips and  falls. When getting up in the mornings, sit up at the edge of the bed for a few minutes before getting out of bed. Consider elevating the bed at the head end to avoid drop of blood pressure when getting up. Walk always in a well-lit room (use night lights in the walls). Avoid area rugs or power cords from appliances in the middle of the walkways. Use a walker or a cane if necessary and consider physical therapy for balance exercise. Get your eyesight checked regularly.  FINANCIAL OVERSIGHT: Supervision, especially oversight when making financial decisions or transactions is also recommended.  HOME SAFETY: Consider the safety of the kitchen when operating appliances like stoves, microwave oven, and blender. Consider having supervision and share cooking responsibilities until no longer able to participate in those. Accidents with firearms and other hazards in the house should be identified and addressed as well.   ABILITY TO BE LEFT ALONE: If patient is unable to contact 911 operator, consider using LifeLine, or when the need is there, arrange for someone to stay with patients. Smoking is a fire hazard, consider supervision or cessation. Risk of wandering should be assessed by caregiver and if detected at any point, supervision and safe proof recommendations should be instituted.  MEDICATION SUPERVISION: Inability to self-administer medication needs to be constantly addressed. Implement a mechanism to ensure safe administration of the medications.   DRIVING: Regarding driving, in patients with progressive memory problems, driving will be impaired. We advise to have someone else do the driving if trouble finding directions or if minor accidents are reported. Independent driving assessment is available to determine safety of driving.   If you are interested in the driving assessment, you can contact the following:  [facility] [employer] [facility] in [place] [phone]  Driver Rehabilitative Services  [phone]  [facility] [phone] or [phone]    Mediterranean Diet A Mediterranean diet refers to food and lifestyle choices that are based on the traditions of countries located on the [employer]. This way of eating has been shown to help prevent certain conditions and improve outcomes for people who have chronic diseases, like kidney disease and heart disease. What are tips for following this plan? Lifestyle  Cook and eat meals together with your family, when possible. Drink enough fluid to keep your urine clear or pale yellow. Be physically active every day. This includes: Aerobic exercise like running or swimming. Leisure activities like gardening, walking, or housework. Get 7-8 hours of sleep each night. If recommended by your health care provider, drink red wine in moderation. This means 1 glass a day for nonpregnant women and 2 glasses a day for men. A glass of wine equals 5 oz (150 mL). Reading food labels  Check the serving size of packaged foods. For foods such as rice and pasta, the serving size refers to the amount of cooked product, not dry. Check the total fat in packaged foods. Avoid foods that have saturated fat or trans fats. Check the ingredients list for added sugars, such as corn syrup. Shopping  At the grocery store, buy most of your food from the areas near the walls of the store. This includes: Fresh fruits and  vegetables (produce). Grains, beans, nuts, and seeds. Some of these may be available in unpackaged forms or large amounts (in bulk). Fresh seafood. Poultry and eggs. Low-fat dairy products. Buy whole ingredients instead of prepackaged foods. Buy fresh fruits and vegetables in-season from local farmers markets. Buy frozen fruits and vegetables in resealable bags. If you do not have access to quality fresh seafood, buy precooked frozen shrimp or canned fish, such as tuna, salmon, or sardines. Buy  small amounts of raw or cooked vegetables, salads, or olives from the deli or salad bar at your store. Stock your pantry so you always have certain foods on hand, such as olive oil, canned tuna, canned tomatoes, rice, pasta, and beans. Cooking  Cook foods with extra-virgin olive oil instead of using butter or other vegetable oils. Have meat as a side dish, and have vegetables or grains as your main dish. This means having meat in small portions or adding small amounts of meat to foods like pasta or stew. Use beans or vegetables instead of meat in common dishes like chili or lasagna. Experiment with different cooking methods. Try roasting or broiling vegetables instead of steaming or sauteing them. Add frozen vegetables to soups, stews, pasta, or rice. Add nuts or seeds for added healthy fat at each meal. You can add these to yogurt, salads, or vegetable dishes. Marinate fish or vegetables using olive oil, lemon juice, garlic, and fresh herbs. Meal planning  Plan to eat 1 vegetarian meal one day each week. Try to work up to 2 vegetarian meals, if possible. Eat seafood 2 or more times a week. Have healthy snacks readily available, such as: Vegetable sticks with hummus. Greek yogurt. Fruit and nut trail mix. Eat balanced meals throughout the week. This includes: Fruit: 2-3 servings a day Vegetables: 4-5 servings a day Low-fat dairy: 2 servings a day Fish, poultry, or lean meat: 1 serving a day Beans and legumes: 2 or more servings a week Nuts and seeds: 1-2 servings a day Whole grains: 6-8 servings a day Extra-virgin olive oil: 3-4 servings a day Limit red meat and sweets to only a few servings a month What are my food choices? Mediterranean diet Recommended Grains: Whole-grain pasta. Brown rice. Bulgar wheat. Polenta. Couscous. Whole-wheat bread. [place] Made[place]. Vegetables: Artichokes. Beets. Broccoli. Cabbage. Carrots. Eggplant. Green beans. Chard. Kale. Spinach. Onions. Leeks. Peas.  Squash. Tomatoes. Peppers. Radishes. Fruits: Apples. Apricots. Avocado. Berries. Bananas. Cherries. Dates. Figs. Grapes. Lemons. Melon. Oranges. Peaches. Plums. Pomegranate. Meats and other protein foods: Beans. Almonds. Sunflower seeds. Pine nuts. Peanuts. Cod. Salmon. Scallops. Shrimp. Tuna. Tilapia. Clams. Oysters. Eggs. Dairy: Low-fat milk. Cheese. Greek yogurt. Beverages: Water. Red wine. Herbal tea. Fats and oils: Extra virgin olive oil. Avocado oil. Grape seed oil. Sweets and desserts: Greek yogurt with honey. Baked apples. Poached pears. Trail mix. Seasoning and other foods: Basil. Cilantro. Coriander. Cumin. Mint. Parsley. Sage. Rosemary. Tarragon. Garlic. Oregano. Thyme. Pepper. Balsalmic vinegar. Tahini. Hummus. Tomato sauce. Olives. Mushrooms. Limit these Grains: Prepackaged pasta or rice dishes. Prepackaged cereal with added sugar. Vegetables: Deep fried potatoes (french fries). Fruits: Fruit canned in syrup. Meats and other protein foods: Beef. Pork. Lamb. Poultry with skin. Hot dogs. [place]. Dairy: Ice cream. Sour cream. Whole milk. Beverages: Juice. Sugar-sweetened soft drinks. Beer. Liquor and spirits. Fats and oils: Butter. Canola oil. Vegetable oil. Beef fat (tallow). Lard. Sweets and desserts: Cookies. Cakes. Pies. Candy. Seasoning and other foods: Mayonnaise. Premade sauces and marinades. The items listed may not be a complete list. Talk with  your dietitian about what dietary choices are right for you. Summary The Mediterranean diet includes both food and lifestyle choices. Eat a variety of fresh fruits and vegetables, beans, nuts, seeds, and whole grains. Limit the amount of red meat and sweets that you eat. Talk with your health care provider about whether it is safe for you to drink red wine in moderation. This means 1 glass a day for nonpregnant women and 2 glasses a day for men. A glass of wine equals 5 oz (150 mL). This information is not intended to replace advice  given to you by your health care provider. Make sure you discuss any questions you have with your health care provider. Document Released: 08/28/2015 Document Revised: 09/30/2015 Document Reviewed: 08/28/2015 Elsevier Interactive Patient Education  2017 [employer].      MRI brain at [facility] [phone] Labs today [address]

## 2023-02-24 DIAGNOSIS — D539 Nutritional anemia, unspecified: Secondary | ICD-10-CM | POA: Diagnosis not present

## 2023-03-10 DIAGNOSIS — N390 Urinary tract infection, site not specified: Secondary | ICD-10-CM | POA: Diagnosis not present

## 2023-03-23 DIAGNOSIS — Z9181 History of falling: Secondary | ICD-10-CM | POA: Diagnosis not present

## 2023-03-23 DIAGNOSIS — Z0000 Encounter for general adult medical examination without abnormal findings: Secondary | ICD-10-CM | POA: Diagnosis not present

## 2023-03-29 ENCOUNTER — Encounter: Payer: Self-pay | Admitting: Psychology

## 2023-03-30 ENCOUNTER — Encounter: Payer: Self-pay | Admitting: Psychology

## 2023-03-30 ENCOUNTER — Ambulatory Visit: Payer: Self-pay

## 2023-03-30 ENCOUNTER — Ambulatory Visit (INDEPENDENT_AMBULATORY_CARE_PROVIDER_SITE_OTHER): Payer: Medicare Other | Admitting: Psychology

## 2023-03-30 DIAGNOSIS — F0280 Dementia in other diseases classified elsewhere without behavioral disturbance: Secondary | ICD-10-CM

## 2023-03-30 DIAGNOSIS — F411 Generalized anxiety disorder: Secondary | ICD-10-CM | POA: Diagnosis not present

## 2023-03-30 DIAGNOSIS — F0150 Vascular dementia without behavioral disturbance: Secondary | ICD-10-CM | POA: Diagnosis not present

## 2023-03-30 DIAGNOSIS — I679 Cerebrovascular disease, unspecified: Secondary | ICD-10-CM | POA: Diagnosis not present

## 2023-03-30 DIAGNOSIS — F333 Major depressive disorder, recurrent, severe with psychotic symptoms: Secondary | ICD-10-CM

## 2023-03-30 DIAGNOSIS — G309 Alzheimer's disease, unspecified: Secondary | ICD-10-CM

## 2023-03-30 DIAGNOSIS — R4189 Other symptoms and signs involving cognitive functions and awareness: Secondary | ICD-10-CM

## 2023-03-30 HISTORY — DX: Dementia in other diseases classified elsewhere, unspecified severity, without behavioral disturbance, psychotic disturbance, mood disturbance, and anxiety: F02.80

## 2023-03-30 NOTE — Progress Notes (Unsigned)
 NEUROPSYCHOLOGICAL EVALUATION . [facility] Department of Neurology  Date of Evaluation: March 30, 2023  Reason for Referral:   Melinda Hartman is a 83 y.o. right-handed Caucasian female referred by Marlowe Kays, PA-C, to characterize her current cognitive functioning and assist with diagnostic clarity and treatment planning in the context of subjective cognitive decline.   Assessment and Plan:   Clinical Impression(s): Melinda Hartman' pattern of performance is suggestive of primary impairments surrounding attention/concentration, verbal fluency (phonemic slightly worse than semantic), and encoding (i.e., learning) aspects of memory. Further performance variability was exhibited across processing speed, confrontation naming, visuospatial abilities, and both delayed retrieval and recognition/consolidation aspects of memory. Performances were appropriate relative to age-matched peers across cognitive flexibility. Functionally, Melinda Hartman no longer drives and her husband has taken over medication management, financial management, and bill paying responsibilities. Given evidence for cognitive and functional decline, Melinda Hartman best meets diagnostic criteria for a Major Neurocognitive Disorder ("dementia") at the present time.  The etiology for her dementia presentation remains somewhat uncertain. Past neuroimaging suggested advanced microvascular ischemic disease and patterns of impairment across testing aligns with this finding reasonably well. Additionally, across mood-related questionnaires, Melinda Hartman reported acute symptoms of severe anxiety and severe depression. The severity of psychiatric distress could explain concern surrounding auditory hallucinations. There certainly remains the potential for a primary vascular cause for ongoing impairment (i.e., a vascular dementia presentation), exacerbated by the presence of severe psychiatric distress. Frequent  headaches, chronic pain, and medication side effects (especially clonazepam/Klonopin and tramadol/Ultram) may serve to further exacerbate deficits. While her fall and concern surrounding a sustained concussion in February 2024 may be a contributing factor, I do not believe that this accounts for the total degree of ongoing impairment.   Despite impairments across initial learning trials of memory testing, Melinda Hartman was able to demonstrate at least some retention across a story learning task (retention rates were 0% and 25% across figure and list-based tasks). She also benefited from cueing across list and figure tasks. This would be somewhat inconsistent with a classic Alzheimer's disease. Variable but largely adequate confrontation naming is also encouraging in this regard. Ultimately, while concurrent underlying Alzheimer's disease is plausible and cannot be ruled out, I am unable to rule it in with confidence based upon current testing. Continued medical monitoring will be important moving forward.  Recommendations: Melinda Hartman has already been prescribed a medication aimed to address memory loss and concerns surrounding progressive cognitive decline (i.e., memantine/Namenda). She is encouraged to continue taking this medication as prescribed. It is important to highlight that this medication has been shown to slow functional decline in some individuals. There is no current treatment which can stop or reverse cognitive decline when caused by a neurodegenerative illness.   A combination of medication and psychotherapy has been shown to be most effective at treating symptoms of anxiety and depression. As such, Melinda Hartman is encouraged to speak with her prescribing physician regarding medication adjustments to optimally manage these symptoms. Likewise, Melinda Hartman is encouraged to consider engaging in short-term psychotherapy to address symptoms of psychiatric distress. She would benefit from an active  and collaborative therapeutic environment, rather than one purely supportive in nature. Recommended treatment modalities include Cognitive Behavioral Therapy (CBT) or Acceptance and Commitment Therapy (ACT). Therapy may have to be modified given cognitive dysfunction.   Performance across neurocognitive testing is not a strong predictor of an individual's safety operating a motor vehicle. With that being said, I do agree with  recommendations to have her fully abstain from driving behaviors. Should she or her family wish to pursue a formalized driving evaluation, they could reach out to the following agencies: [employer] in [place]: [phone] [facility]: [phone] [facility]: [phone] [facility]: [phone] or [phone]  Should there be progression of current deficits over time, Melinda Hartman is unlikely to regain any independent living skills lost. Therefore, it is recommended that she remain as involved as possible in all aspects of household chores, finances, and medication management, with supervision to ensure adequate performance. She will likely benefit from the establishment and maintenance of a routine in order to maximize her functional abilities over time.  It will be important for Melinda Hartman to have another person with her when in situations where she may need to process information, weigh the pros and cons of different options, and make decisions, in order to ensure that she fully understands and recalls all information to be considered.  If not already done, Melinda Hartman and her family may want to discuss her wishes regarding durable power of attorney and medical decision making, so that she can have input into these choices. If they require legal assistance with this, long-term care resource access, or other aspects of estate planning, they could reach out to [employer] at [phone] for a free consultation.  Additionally, they may wish to discuss future plans for caretaking and seek out community options for in home/residential care should they become necessary.  Melinda Hartman is encouraged to attend to lifestyle factors for brain health (e.g., regular physical exercise, good nutrition habits and consideration of the MIND-DASH diet, regular participation in cognitively-stimulating activities, and general stress management techniques), which are likely to have benefits for both emotional adjustment and cognition. In fact, in addition to promoting good general health, regular exercise incorporating aerobic activities (e.g., brisk walking, jogging, cycling, etc.) has been demonstrated to be a very effective treatment for depression and stress, with similar efficacy rates to both antidepressant medication and psychotherapy. Optimal control of vascular risk factors (including safe cardiovascular exercise and adherence to dietary recommendations) is encouraged. Continued participation in activities which provide mental stimulation and social interaction is also recommended.   Important information should be provided to Melinda Hartman in written format in all instances. This information should be placed in a highly frequented and easily visible location within her home to promote recall. External strategies such as written notes in a consistently used memory journal, visual and nonverbal auditory cues such as a calendar on the refrigerator or appointments with alarm, such as on a cell phone, can also help maximize recall.  When learning new information, she would benefit from information being broken up into small, manageable pieces. She may also find it helpful to articulate the material in her own words and in a context to promote encoding at the onset of a new task. This material may need to be repeated multiple times to promote encoding.  Because she shows better recall for structured information, she will likely  understand and retain new information better if it is presented to her in a meaningful or well-organized manner at the outset, such as grouping items into meaningful categories or presenting information in an outlined, bulleted, or story format.  To address problems with processing speed, she may wish to consider:   -Ensuring that she is alerted when essential material or instructions are being presented   -Adjusting the speed at which new information is presented   -  Allowing for more time in comprehending, processing, and responding in conversation   -Repeating and paraphrasing instructions or conversations aloud  To address problems with fluctuating attention and/or executive dysfunction, she may wish to consider:   -Avoiding external distractions when needing to concentrate   -Limiting exposure to fast paced environments with multiple sensory demands   -Writing down complicated information and using checklists   -Attempting and completing one task at a time (i.e., no multi-tasking)   -Verbalizing aloud each step of a task to maintain focus   -Taking frequent breaks during the completion of steps/tasks to avoid fatigue   -Reducing the amount of information considered at one time   -Scheduling more difficult activities for a time of day where she is usually most alert  Review of Records:   Melinda Hartman was seen by [facility] Jerl Mina, M.D.) on 10/01/2022 for an evaluation of memory loss. Non-specific difficulties with cognition were said to be present for years. There is also a history of balance instability and numerous falls, including a fall this past February with a direct head impact. Her husband noted that cognition worsened after that event in particular. Performance on a brief cognitive screening instrument [facility]) was 13/30 in June 2024. Performance on another screening task (SLUMS) with Dr. Corwin Levins was 7/30. Melinda Hartman was ultimately diagnosed with dementia.   She  was seen by [facility] Marlowe Kays, PA-C) on 02/23/2023 for ongoing dementia concerns. Cognition was stable, with her husband continuing to report significant memory dysfunction. Her husband had taken over all ADLs and Ms. Clune no longer drives. Performance on a different screening task (MMSE) was 27/30. Ultimately, Ms. Morandi was referred for a comprehensive neuropsychological evaluation to characterize her cognitive abilities and to assist with diagnostic clarity and treatment planning.   Neuroimaging: Brain MRI on 07/02/2022 suggested advanced microvascular ischemic disease, significantly progressed relative to a prior 2019 scan (which was unavailable for my review), and mild to moderate generalized cerebral atrophy.  Past Medical History:  Diagnosis Date   Acute lower UTI 09/21/2019   Anemia, unspecified 12/31/2019   Ankylosis of thoracic spine 12/28/2019   Breast cancer in female 09/30/2021   Cataracts, bilateral    Constipation, unspecified 12/31/2019   COPD (chronic obstructive pulmonary disease)    Drug-induced osteoporosis 08/09/2016   Dysphagia, oropharyngeal 04/21/2017   Essential hypertension 09/21/2019   Frequent falls 12/18/2019   Fusion of spine, cervical region 12/28/2019   Fusion of spine, thoracic region 12/28/2019   Generalized anxiety disorder 12/28/2019   GERD (gastroesophageal reflux disease) 11/19/1995   Nissen Fundoplication     Gross hematuria 04/21/2017   History of compression fracture of spine 08/09/2016   Hyperlipidemia 12/31/2019   Hypokalemia 12/31/2019   IBS (irritable bowel syndrome)    Insomnia, unspecified 12/28/2019   Loosening of hardware in spine 07/24/2013   Major depressive disorder 12/31/2019   Moderate asthma without complication 04/21/2017   Muscle weakness (generalized) 12/29/2019   OAB (overactive bladder) 04/24/2013   S/p Mirabegron, Oxybutynin and Detrol. S/p pelvic floor physical therapy.   DO demonstrated on UDS 02/2020:    1. Uroflow Impression: Low prevoid volume.  Unable to void.  Normal post void residual   2. Cystometrogram Summary:  Detrusor overactivity and detrusor overactivity incontinence.   3. Stress Testing Impressions:  Unable to assess due to frequent DO/DOI contractions.  Patient able to suppre   Pathological fracture of vertebra due to osteoporosis with routine healing 10/21/2015   Pneumonia    PONV (  postoperative nausea and vomiting) 10/31/2012   success with scopalamine patch     Primary osteoarthritis of both knees 02/17/2021   Pseudarthrosis following spinal fusion 10/16/2013   Recurrent UTI 09/21/2019   03/2022: Doing well on Hiprex and vaginal estradiol  Discontinued Hiprex   10/2022: currently only on vaginal estradiol, D/C Hiprex, start Monurol q weekly x 3 months     Spinal stenosis 07/17/2013   Spondylosis without myelopathy or radiculopathy, cervicothoracic region 12/28/2019   Syncope 09/20/2019   Unspecified protein-calorie malnutrition 12/28/2019   Ventral hernia 10/16/2012    Past Surgical History:  Procedure Laterality Date   ABDOMINAL HYSTERECTOMY  1977   APPENDECTOMY     BACK SURGERY     x4 22 screws 2 rods   CATARACT EXTRACTION Bilateral    COLONOSCOPY  04/11/2014   Mild diverticulosis. Otherwise normal colonoscopy. Highly redundant colon.    GALLBLADDER SURGERY  02/26/2017   HERNIA REPAIR  02/26/2017   TWO ABDOMINAL HERNIA'S AND HIATAL HERNIA REPAIR   KNEE ARTHROSCOPY Left    POLYPECTOMY     TONSILLECTOMY AND ADENOIDECTOMY     TUBAL LIGATION      Current Outpatient Medications:    acetaminophen (TYLENOL) 325 MG tablet, Take 325 mg by mouth every 6 (six) hours as needed for moderate pain (pain score 4-6)., Disp: , Rfl:    albuterol (PROVENTIL) (2.5 MG/3ML) 0.083% nebulizer solution, Take 2.5 mg by nebulization every 6 (six) hours as needed., Disp: , Rfl:    albuterol (VENTOLIN HFA) 108 (90 Base) MCG/ACT inhaler, Inhale into the lungs every 6 (six) hours as needed.,  Disp: , Rfl:    aspirin EC 81 MG tablet, Take 81 mg by mouth daily. , Disp: , Rfl:    Calcium Carbonate-Vitamin D (CALTRATE 600+D PO), Take by mouth., Disp: , Rfl:    celecoxib (CELEBREX) 100 MG capsule, Take 100 mg by mouth 2 (two) times daily., Disp: , Rfl:    clonazePAM (KLONOPIN) 0.5 MG tablet, Take 1 mg by mouth 2 (two) times daily. , Disp: , Rfl:    diclofenac (CATAFLAM) 50 MG tablet, Take 50 mg by mouth 3 (three) times daily., Disp: , Rfl:    diphenoxylate-atropine (LOMOTIL) 2.5-0.025 MG tablet, Take 2 tablets by mouth 4 (four) times daily as needed for diarrhea or loose stools., Disp: 30 tablet, Rfl: 0   DULoxetine (CYMBALTA) 30 MG capsule, Take 30 mg by mouth daily., Disp: , Rfl:    esomeprazole (NEXIUM) 40 MG capsule, Take 40 mg by mouth 2 (two) times daily., Disp: , Rfl:    estradiol (ESTRACE) 0.1 MG/GM vaginal cream, Place 1 Applicatorful vaginally at bedtime., Disp: , Rfl:    ezetimibe (ZETIA) 10 MG tablet, Take 10 mg by mouth daily., Disp: , Rfl:    fluticasone-salmeterol (ADVAIR) 250-50 MCG/ACT AEPB, Inhale 1 puff into the lungs in the morning and at bedtime., Disp: , Rfl:    fosfomycin (MONUROL) 3 g PACK, Take 3 g by mouth once a week., Disp: , Rfl:    gabapentin (NEURONTIN) 300 MG capsule, Take 300 mg by mouth 3 (three) times daily., Disp: , Rfl:    memantine (NAMENDA) 5 MG tablet, Take 1 tablet (5 mg total) by mouth 2 (two) times daily., Disp: 180 tablet, Rfl: 3   nitroGLYCERIN (NITROSTAT) 0.4 MG SL tablet, Place 0.4 mg under the tongue every 5 (five) minutes as needed for chest pain. , Disp: , Rfl:    polyethylene glycol (MIRALAX / GLYCOLAX) packet, Take 17 g by  mouth daily as needed for mild constipation. , Disp: , Rfl:    potassium chloride SA (K-DUR,KLOR-CON) 20 MEQ tablet, Take 20 mEq by mouth daily. , Disp: , Rfl:    telmisartan (MICARDIS) 40 MG tablet, Take 40 mg by mouth daily., Disp: , Rfl:    traMADol (ULTRAM) 50 MG tablet, Take 50 mg by mouth every 12 (twelve) hours as  needed for moderate pain. , Disp: , Rfl:   Clinical Interview:   The following information was obtained during a clinical interview with Ms. Doria and her husband prior to cognitive testing.  Cognitive Symptoms: Decreased short-term memory: Endorsed. Ms. Fath noted some trouble recalling names. Her husband described far more prominent concerns, including rapid forgetting and frequent repetition in day-to-day conversation. He described further examples of Ms. Kabel not knowing her phone number or not remembering the color of their house. Medical records also suggest some orientation concerns where Melinda Hartman has referred to him as "daddy" on several occasions. Difficulties were said to have progressively worsened over time.  Decreased long-term memory: Denied. Decreased attention/concentration: Endorsed. She reported difficulty with sustained focus and distractibility. Reduced processing speed: Endorsed. She noted that church services have become harder to follow due to the speed at which information is presented.  Difficulties with executive functions: Endorsed. They described trouble with organization, multi-tasking, and problem solving to some extent. No impulsivity was noted.  Difficulties with emotion regulation: Denied. They also denied any prominent personality changes.  Difficulties with receptive language: Denied. Difficulties with word finding: Endorsed. Decreased visuoperceptual ability: Denied.  Trajectory of deficits: Per her husband, Ms. Dant sustained a fall in February 2024 where she struck the side of her face. No loss in consciousness was reported. He suspected that she may have experienced a concussion at that time. Since this fall, he reported a notable decline in memory and other thinking abilities. Medical records suggest that concerns surrounding significant cognitive decline were present prior to this 2024 fall.  Difficulties completing ADLs: Endorsed. Her  husband has fully taken over medication management, financial management, and bill paying responsibilities. Ms. Aro no longer drives at the recommendation of her medical team due to cognitive impairment.   Additional Medical History: History of traumatic brain injury/concussion: Endorsed (see above). No other concussion concerns were noted.  History of stroke: Denied. History of seizure activity: Denied. History of known exposure to toxins: Denied. Symptoms of chronic pain: Endorsed. She described fairly diffuse pain, more acutely localized to her knees. Her husband noted that she has undergone five major back surgeries in the past.  Experience of frequent headaches/migraines: Endorsed. She reported semi-frequent headache experiences, said to be more frequent since her February 2024 fall. Frequent instances of dizziness/vertigo: Endorsed.  Sensory changes: She utilizes glasses with benefit and experiences some mild hearing loss. Other sensory changes/difficulties (e.g., taste or smell) were not reported.  Balance/coordination difficulties: Endorsed. She reported significant instability and weakness and uses a rolling walker for added stability while ambulating. Her husband estimated that she has sustained 7-8 falls over the past year or so, with the most recent being about six months prior to the current evaluation. Her right side appears to exhibit greater instability relative to her left.  Other motor difficulties: Denied.  Sleep History: Estimated hours obtained each night: Variable. Difficulties falling asleep: Denied. Difficulties staying asleep: Endorsed. Feels rested and refreshed upon awakening: Variably so depending on the quantity and quality of sleep obtained the night before.   History of snoring: Endorsed. History of  waking up gasping for air: Denied. Witnessed breath cessation while asleep: Denied.  History of vivid dreaming: Denied. Excessive movement while asleep:  Denied. Instances of acting out her dreams: Denied.  Psychiatric/Behavioral Health History: Depression: She described her current mood as "fair" but did describe some generally mild depressive symptoms. She reported a previous depression-related diagnosis, treated via medication. She perceived said medication to be effective at managing symptoms. Current or remote suicidal ideation, intent, or plan was denied.  Anxiety: She reported a history of generalized anxious distress which can become significant at times. Symptoms are treated well with medication.  Mania: Denied. Trauma History: Denied. Visual/auditory hallucinations: Her husband noted that she may experience auditory hallucinations in the form of hearing voices coming from upstairs in their home. This was said to occur fairly often and has been going on for "quite a while." Delusional thoughts: Denied.  Tobacco: Denied. Alcohol: She denied current alcohol consumption as well as a history of problematic alcohol abuse or dependence.  Recreational drugs: Denied.  Family History: Problem Relation Age of Onset   Stomach cancer Mother    Alzheimer's disease Sister    Alzheimer's disease Sister    Colon cancer Neg Hx    Esophageal cancer Neg Hx    This information was confirmed by Melinda Hartman.  Academic/Vocational History: Highest level of educational attainment: 12 years. 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 English was noted as a likely relative weakness.  History of developmental delay: Denied. History of grade repetition: Denied. Enrollment in special education courses: Denied. History of LD/ADHD: Denied.  Employment: Retired. 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   Evaluation Results:   Behavioral Observations: Ms. Fitzsimmons was accompanied by her husband, arrived to her  appointment on time, and was appropriately dressed and groomed. She appeared alert. She ambulated with the assistance of a rolling walker and maneuvered this device well. Gross motor functioning appeared intact upon informal observation and no abnormal movements (e.g., tremors) were noted. Her affect was generally relaxed and positive. Spontaneous speech was fluent and word finding difficulties were not observed during the clinical interview. Thought processes were coherent, organized, and normal in content. Insight into her cognitive difficulties appeared fairly adequate. However, I do have some concern that she does not fully appreciate the extent of ongoing impairment.   During testing, sustained attention was appropriate. Task engagement was adequate and she persisted when challenged. She developed a worsening headache as testing procedures progressed. The evaluation was mildly abbreviated in response. Overall, Ms. Schreurs was cooperative with the clinical interview and subsequent testing procedures.   Adequacy of Effort: The validity of neuropsychological testing is limited by the extent to which the individual being tested may be assumed to have exerted adequate effort during testing. Ms. Zook expressed her intention to perform to the best of her abilities and exhibited adequate task engagement and persistence. Scores across stand-alone and embedded performance validity measures were variable. However, her sole below expectation performance is likely due to true cognitive impairment rather than poor engagement or attempts to perform poorly. As such, the results of the current evaluation are believed to be a valid representation of Ms. Duke' current cognitive functioning.  Test Results: Ms. Stoermer was mildly disoriented at the time of the current evaluation. She incorrectly stated her age ("24") and was unable to state the current  date.  Intellectual abilities based upon educational and  vocational attainment were estimated to be in the average range. Premorbid abilities were estimated to be within the below average range based upon a single-word reading test.   Processing speed was variable, ranging from the well below average to average normative ranges. Basic attention was well below average. More complex attention (e.g., working memory) was unable to be assessed. Cognitive flexibility was below average. Additional aspects of executive functioning were unable to be assessed.  Receptive language abilities were unable to be directly assessed. However, Ms. Riederer did not exhibit any difficulties comprehending task instructions and answered all questions asked of her appropriately. Assessed expressive language was somewhat variable. Phonemic fluency was well below average, semantic fluency was well below average to below average, and confrontation naming was well below average to below average.    Assessed visuospatial/visuoconstructional abilities were average outside of isolated difficulties across a line orientation task.    Learning (i.e., encoding) of novel verbal information was exceptionally low to well below average. Spontaneous delayed recall (i.e., retrieval) of previously learned information was variable, ranging from the exceptionally low to below average normative ranges. Retention rates were 25% (raw score of 1) across a list learning task, 100% across a story learning task, and 0% across a figure drawing task. Performance across recognition tasks was variable, ranging from the well below average to average normative ranges, suggesting some evidence for information consolidation.   Results of emotional screening instruments suggested that recent symptoms of generalized anxiety were in the severe range, while symptoms of depression were also within the severe range. A screening instrument assessing recent sleep quality suggested the presence of mild sleep  dysfunction.  Tables of Scores:   Note: This summary of test scores accompanies the interpretive report and should not be considered in isolation without reference to the appropriate sections in the text. Descriptors are based on appropriate normative data and may be adjusted based on clinical judgment. Terms such as "Within Normal Limits" and "Outside Normal Limits" are used when a more specific description of the test score cannot be determined.       Percentile - Normative Descriptor > 98 - Exceptionally High 91-97 - Well Above Average 75-90 - Above Average 25-74 - Average 9-24 - Below Average 2-8 - Well Below Average < 2 - Exceptionally Low       Validity:   DESCRIPTOR       DCT: --- --- Within Normal Limits  RBANS EI: --- --- Outside Normal Limits       Orientation:      Raw Score Percentile   NAB Orientation, Form 1 26/29 --- ---       Cognitive Screening:      Raw Score Percentile   SLUMS: 16/30 --- ---       RBANS, Form A: Standard Score/ Scaled Score Percentile   Total Score 62 1 Exceptionally Low  Immediate Memory 65 1 Exceptionally Low    List Learning 5 5 Well Below Average    Story Memory 3 1 Exceptionally Low  Visuospatial/Constructional 81 10 Below Average    Figure Copy 8 25 Average    Line Orientation 12/20 3-9 Well Below Average  Language 75 5 Well Below Average    Picture Naming 8/10 3-9 Well Below Average    Semantic Fluency 6 9 Below Average  Attention 64 1 Exceptionally Low    Digit Span 5 5 Well Below Average    Coding 4 2 Well  Below Average  Delayed Memory 64 1 Exceptionally Low    List Recall 1/10 10-16 Below Average    List Recognition 17/20 10-16 Below Average    Story Recall 6 9 Below Average    Story Recognition 6/12 5-6 Well Below Average    Figure Recall 1 <1 Exceptionally Low    Figure Recognition 4/8 21-38 Below Average to  Average        Intellectual Functioning:      Standard Score Percentile   Test of Premorbid Functioning: 89  23 Below Average       Attention/Executive Function:     Trail Making Test (TMT): Raw Score (T Score) Percentile     Part A 51 secs.,  0 errors (45) 31 Average    Part B 182 secs.,  1 error (42) 21 Below Average         Scaled Score Percentile   WAIS-5 Coding: 6 9 Below Average  WAIS-5 Naming Speed Quantity: 9 37 Average       Language:     Verbal Fluency Test: Raw Score (Scaled Score) Percentile     Phonemic Fluency (CFL) 15 (5) 5 Well Below Average    Category Fluency 21 (5) 5 Well Below Average  *Based on [employer] Older Normative Studies (MOANS)          NAB Language Module, Form 1: T Score Percentile     Naming 25/31 (39) 14 Below Average       Visuospatial/Visuoconstruction:      Raw Score Percentile   Hartman Drawing: 10/10 --- Within Normal Limits       Mood and Personality:      Raw Score Percentile   Geriatric Depression Scale: 26 --- Severe  Geriatric Anxiety Scale: 31 --- Severe    Somatic 14 --- Severe    Cognitive 13 --- Severe    Affective 4 --- Mild       Additional Questionnaires:      Raw Score Percentile   PROMIS Sleep Disturbance Questionnaire: 25 --- Mild   Informed Consent and Coding/Compliance:   The current evaluation represents a clinical evaluation for the purposes previously outlined by the referral source and is in no way reflective of a forensic evaluation.   Ms. Rhein was provided with a verbal description of the nature and purpose of the present neuropsychological evaluation. Also reviewed were the foreseeable risks and/or discomforts and benefits of the procedure, limits of confidentiality, and mandatory reporting requirements of this provider. The patient was given the opportunity to ask questions and receive answers about the evaluation. Oral consent to participate was provided by the patient.   This evaluation was conducted by Newman Nickels, Ph.D., ABPP-CN, board certified clinical neuropsychologist. Ms. Ranum completed a clinical  interview with Dr. Milbert Coulter, billed as one unit [phone], and 125 minutes of cognitive testing and scoring, billed as one unit [id number] and three additional units [id number]. Psychometrist Wallace Keller, B.S. assisted Dr. Milbert Coulter with test administration and scoring procedures. As a separate and discrete service, one unit [id number] and two units [phone] were billed for Dr. Tammy Sours time spent in interpretation and report writing.

## 2023-03-30 NOTE — Progress Notes (Signed)
   Psychometrician Note   Cognitive testing was administered to [facility] by Wallace Keller, B.S. (psychometrist) under the supervision of Dr. Newman Nickels, Ph.D., ABPP, licensed psychologist on 03/30/2023. Melinda Hartman did not appear overtly distressed by the testing session per behavioral observation or responses across self-report questionnaires. Rest breaks were offered.    The battery of tests administered was selected by Dr. Newman Nickels, Ph.D., ABPP with consideration to Melinda Hartman's current level of functioning, the nature of her symptoms, emotional and behavioral responses during interview, level of literacy, observed level of motivation/effort, and the nature of the referral question. This battery was communicated to the psychometrist. Communication between Dr. Newman Nickels, Ph.D., ABPP and the psychometrist was ongoing throughout the evaluation and Dr. Newman Nickels, Ph.D., ABPP was immediately accessible at all times. Dr. Newman Nickels, Ph.D., ABPP provided supervision to the psychometrist on the date of this service to the extent necessary to assure the quality of all services provided.    Melinda Hartman will return within approximately 1-2 weeks for an interactive feedback session with Dr. Milbert Coulter at which time her test performances, clinical impressions, and treatment recommendations will be reviewed in detail. Melinda Hartman understands she can contact our office should she require our assistance before this time.  A total of 125 minutes of billable time were spent face-to-face with Melinda Hartman by the psychometrist. This includes both test administration and scoring time. Billing for these services is reflected in the clinical report generated by Dr. Newman Nickels, Ph.D., ABPP  This note reflects time spent with the psychometrician and does not include test scores or any clinical interpretations made by Dr. Milbert Coulter. The full report will follow in a separate note.

## 2023-03-31 DIAGNOSIS — E538 Deficiency of other specified B group vitamins: Secondary | ICD-10-CM | POA: Diagnosis not present

## 2023-03-31 DIAGNOSIS — F419 Anxiety disorder, unspecified: Secondary | ICD-10-CM | POA: Diagnosis not present

## 2023-03-31 DIAGNOSIS — M5442 Lumbago with sciatica, left side: Secondary | ICD-10-CM | POA: Diagnosis not present

## 2023-03-31 DIAGNOSIS — E559 Vitamin D deficiency, unspecified: Secondary | ICD-10-CM | POA: Diagnosis not present

## 2023-03-31 DIAGNOSIS — R739 Hyperglycemia, unspecified: Secondary | ICD-10-CM | POA: Diagnosis not present

## 2023-03-31 DIAGNOSIS — I10 Essential (primary) hypertension: Secondary | ICD-10-CM | POA: Diagnosis not present

## 2023-03-31 DIAGNOSIS — D539 Nutritional anemia, unspecified: Secondary | ICD-10-CM | POA: Diagnosis not present

## 2023-03-31 DIAGNOSIS — M5441 Lumbago with sciatica, right side: Secondary | ICD-10-CM | POA: Diagnosis not present

## 2023-03-31 DIAGNOSIS — E785 Hyperlipidemia, unspecified: Secondary | ICD-10-CM | POA: Diagnosis not present

## 2023-03-31 DIAGNOSIS — F321 Major depressive disorder, single episode, moderate: Secondary | ICD-10-CM | POA: Diagnosis not present

## 2023-03-31 DIAGNOSIS — M1990 Unspecified osteoarthritis, unspecified site: Secondary | ICD-10-CM | POA: Diagnosis not present

## 2023-03-31 DIAGNOSIS — G8929 Other chronic pain: Secondary | ICD-10-CM | POA: Diagnosis not present

## 2023-04-08 ENCOUNTER — Encounter: Payer: Medicare Other | Admitting: Psychology

## 2023-04-13 ENCOUNTER — Encounter: Admitting: Psychology

## 2023-04-19 ENCOUNTER — Ambulatory Visit (INDEPENDENT_AMBULATORY_CARE_PROVIDER_SITE_OTHER): Admitting: Psychology

## 2023-04-19 DIAGNOSIS — I679 Cerebrovascular disease, unspecified: Secondary | ICD-10-CM

## 2023-04-19 DIAGNOSIS — G309 Alzheimer's disease, unspecified: Secondary | ICD-10-CM

## 2023-04-19 DIAGNOSIS — F0150 Vascular dementia without behavioral disturbance: Secondary | ICD-10-CM

## 2023-04-19 DIAGNOSIS — F0280 Dementia in other diseases classified elsewhere without behavioral disturbance: Secondary | ICD-10-CM | POA: Diagnosis not present

## 2023-04-19 NOTE — Progress Notes (Signed)
   Neuropsychology Feedback Session Melinda Hartman. [facility] Department of Neurology  Reason for Referral:   [name] is a 83 y.o. right-handed Caucasian female referred by Melinda Kays, PA-C, to characterize her current cognitive functioning and assist with diagnostic clarity and treatment planning in the context of subjective cognitive decline.   Feedback:   Ms. Melinda Hartman completed a comprehensive neuropsychological evaluation on 03/30/2023. Please refer to that encounter for the full report and recommendations. Briefly, results suggested primary impairments surrounding attention/concentration, verbal fluency (phonemic slightly worse than semantic), and encoding (i.e., learning) aspects of memory. Further performance variability was exhibited across processing speed, confrontation naming, visuospatial abilities, and both delayed retrieval and recognition/consolidation aspects of memory. The etiology for her dementia presentation remains somewhat uncertain. Past neuroimaging suggested advanced microvascular ischemic disease and patterns of impairment across testing aligns with this finding reasonably well. Additionally, across mood-related questionnaires, Ms. Melinda Hartman reported acute symptoms of severe anxiety and severe depression. The severity of psychiatric distress could explain concern surrounding auditory hallucinations. There certainly remains the potential for a primary vascular cause for ongoing impairment (i.e., a vascular dementia presentation), exacerbated by the presence of severe psychiatric distress. Frequent headaches, chronic pain, and medication side effects (especially clonazepam/Klonopin and tramadol/Ultram) may serve to further exacerbate deficits. Despite impairments across initial learning trials of memory testing, Ms. Melinda Hartman was able to demonstrate at least some retention across a story learning task (retention rates were 0% and 25% across figure and  list-based tasks). She also benefited from cueing across list and figure tasks. This would be somewhat inconsistent with a classic Alzheimer's disease. Variable but largely adequate confrontation naming is also encouraging in this regard. Ultimately, while concurrent underlying Alzheimer's disease is plausible and cannot be ruled out, I am unable to rule it in with confidence based upon current testing. Continued medical monitoring will be important moving forward.  Ms. Melinda Hartman was accompanied by her husband during the current feedback session. Content of the current session focused on the results of her neuropsychological evaluation. Ms. Melinda Hartman was given the opportunity to ask questions and her questions were answered. She was encouraged to reach out should additional questions arise. A copy of her report was provided at the conclusion of the visit.      One unit [phone] was billed for Dr. Tammy Sours time spent preparing for, conducting, and documenting the current feedback session with Ms. Melinda Hartman.

## 2023-04-22 DIAGNOSIS — M170 Bilateral primary osteoarthritis of knee: Secondary | ICD-10-CM | POA: Diagnosis not present

## 2023-04-22 DIAGNOSIS — M1711 Unilateral primary osteoarthritis, right knee: Secondary | ICD-10-CM | POA: Diagnosis not present

## 2023-04-22 DIAGNOSIS — M1712 Unilateral primary osteoarthritis, left knee: Secondary | ICD-10-CM | POA: Diagnosis not present

## 2023-04-27 DIAGNOSIS — H903 Sensorineural hearing loss, bilateral: Secondary | ICD-10-CM | POA: Diagnosis not present

## 2023-04-27 DIAGNOSIS — M810 Age-related osteoporosis without current pathological fracture: Secondary | ICD-10-CM | POA: Diagnosis not present

## 2023-04-27 DIAGNOSIS — G4700 Insomnia, unspecified: Secondary | ICD-10-CM | POA: Diagnosis not present

## 2023-04-27 DIAGNOSIS — N3281 Overactive bladder: Secondary | ICD-10-CM | POA: Diagnosis not present

## 2023-04-27 DIAGNOSIS — Z7982 Long term (current) use of aspirin: Secondary | ICD-10-CM | POA: Diagnosis not present

## 2023-04-27 DIAGNOSIS — N3946 Mixed incontinence: Secondary | ICD-10-CM | POA: Diagnosis not present

## 2023-04-27 DIAGNOSIS — Z7951 Long term (current) use of inhaled steroids: Secondary | ICD-10-CM | POA: Diagnosis not present

## 2023-04-27 DIAGNOSIS — F03A3 Unspecified dementia, mild, with mood disturbance: Secondary | ICD-10-CM | POA: Diagnosis not present

## 2023-04-27 DIAGNOSIS — Z79899 Other long term (current) drug therapy: Secondary | ICD-10-CM | POA: Diagnosis not present

## 2023-04-27 DIAGNOSIS — I341 Nonrheumatic mitral (valve) prolapse: Secondary | ICD-10-CM | POA: Diagnosis not present

## 2023-04-27 DIAGNOSIS — M150 Primary generalized (osteo)arthritis: Secondary | ICD-10-CM | POA: Diagnosis not present

## 2023-04-27 DIAGNOSIS — F03A18 Unspecified dementia, mild, with other behavioral disturbance: Secondary | ICD-10-CM | POA: Diagnosis not present

## 2023-04-27 DIAGNOSIS — E785 Hyperlipidemia, unspecified: Secondary | ICD-10-CM | POA: Diagnosis not present

## 2023-04-27 DIAGNOSIS — F03A4 Unspecified dementia, mild, with anxiety: Secondary | ICD-10-CM | POA: Diagnosis not present

## 2023-04-27 DIAGNOSIS — M5441 Lumbago with sciatica, right side: Secondary | ICD-10-CM | POA: Diagnosis not present

## 2023-04-27 DIAGNOSIS — E559 Vitamin D deficiency, unspecified: Secondary | ICD-10-CM | POA: Diagnosis not present

## 2023-04-27 DIAGNOSIS — J4530 Mild persistent asthma, uncomplicated: Secondary | ICD-10-CM | POA: Diagnosis not present

## 2023-04-27 DIAGNOSIS — N952 Postmenopausal atrophic vaginitis: Secondary | ICD-10-CM | POA: Diagnosis not present

## 2023-04-27 DIAGNOSIS — F321 Major depressive disorder, single episode, moderate: Secondary | ICD-10-CM | POA: Diagnosis not present

## 2023-04-27 DIAGNOSIS — D649 Anemia, unspecified: Secondary | ICD-10-CM | POA: Diagnosis not present

## 2023-04-27 DIAGNOSIS — M5442 Lumbago with sciatica, left side: Secondary | ICD-10-CM | POA: Diagnosis not present

## 2023-04-27 DIAGNOSIS — G8929 Other chronic pain: Secondary | ICD-10-CM | POA: Diagnosis not present

## 2023-04-27 DIAGNOSIS — I10 Essential (primary) hypertension: Secondary | ICD-10-CM | POA: Diagnosis not present

## 2023-04-27 DIAGNOSIS — Z791 Long term (current) use of non-steroidal anti-inflammatories (NSAID): Secondary | ICD-10-CM | POA: Diagnosis not present

## 2023-04-27 DIAGNOSIS — K219 Gastro-esophageal reflux disease without esophagitis: Secondary | ICD-10-CM | POA: Diagnosis not present

## 2023-04-29 DIAGNOSIS — R399 Unspecified symptoms and signs involving the genitourinary system: Secondary | ICD-10-CM | POA: Diagnosis not present

## 2023-04-29 DIAGNOSIS — N3281 Overactive bladder: Secondary | ICD-10-CM | POA: Diagnosis not present

## 2023-04-29 DIAGNOSIS — N390 Urinary tract infection, site not specified: Secondary | ICD-10-CM | POA: Diagnosis not present

## 2023-05-02 DIAGNOSIS — F321 Major depressive disorder, single episode, moderate: Secondary | ICD-10-CM | POA: Diagnosis not present

## 2023-05-02 DIAGNOSIS — F03A4 Unspecified dementia, mild, with anxiety: Secondary | ICD-10-CM | POA: Diagnosis not present

## 2023-05-02 DIAGNOSIS — M150 Primary generalized (osteo)arthritis: Secondary | ICD-10-CM | POA: Diagnosis not present

## 2023-05-02 DIAGNOSIS — F03A3 Unspecified dementia, mild, with mood disturbance: Secondary | ICD-10-CM | POA: Diagnosis not present

## 2023-05-02 DIAGNOSIS — G4700 Insomnia, unspecified: Secondary | ICD-10-CM | POA: Diagnosis not present

## 2023-05-02 DIAGNOSIS — F03A18 Unspecified dementia, mild, with other behavioral disturbance: Secondary | ICD-10-CM | POA: Diagnosis not present

## 2023-05-05 DIAGNOSIS — M150 Primary generalized (osteo)arthritis: Secondary | ICD-10-CM | POA: Diagnosis not present

## 2023-05-05 DIAGNOSIS — F321 Major depressive disorder, single episode, moderate: Secondary | ICD-10-CM | POA: Diagnosis not present

## 2023-05-05 DIAGNOSIS — G4700 Insomnia, unspecified: Secondary | ICD-10-CM | POA: Diagnosis not present

## 2023-05-05 DIAGNOSIS — F03A3 Unspecified dementia, mild, with mood disturbance: Secondary | ICD-10-CM | POA: Diagnosis not present

## 2023-05-05 DIAGNOSIS — F03A4 Unspecified dementia, mild, with anxiety: Secondary | ICD-10-CM | POA: Diagnosis not present

## 2023-05-05 DIAGNOSIS — F03A18 Unspecified dementia, mild, with other behavioral disturbance: Secondary | ICD-10-CM | POA: Diagnosis not present

## 2023-05-09 DIAGNOSIS — M150 Primary generalized (osteo)arthritis: Secondary | ICD-10-CM | POA: Diagnosis not present

## 2023-05-09 DIAGNOSIS — F03A3 Unspecified dementia, mild, with mood disturbance: Secondary | ICD-10-CM | POA: Diagnosis not present

## 2023-05-09 DIAGNOSIS — F03A4 Unspecified dementia, mild, with anxiety: Secondary | ICD-10-CM | POA: Diagnosis not present

## 2023-05-09 DIAGNOSIS — F321 Major depressive disorder, single episode, moderate: Secondary | ICD-10-CM | POA: Diagnosis not present

## 2023-05-09 DIAGNOSIS — F03A18 Unspecified dementia, mild, with other behavioral disturbance: Secondary | ICD-10-CM | POA: Diagnosis not present

## 2023-05-09 DIAGNOSIS — G4700 Insomnia, unspecified: Secondary | ICD-10-CM | POA: Diagnosis not present

## 2023-05-12 DIAGNOSIS — F321 Major depressive disorder, single episode, moderate: Secondary | ICD-10-CM | POA: Diagnosis not present

## 2023-05-12 DIAGNOSIS — G4700 Insomnia, unspecified: Secondary | ICD-10-CM | POA: Diagnosis not present

## 2023-05-12 DIAGNOSIS — F03A3 Unspecified dementia, mild, with mood disturbance: Secondary | ICD-10-CM | POA: Diagnosis not present

## 2023-05-12 DIAGNOSIS — F03A4 Unspecified dementia, mild, with anxiety: Secondary | ICD-10-CM | POA: Diagnosis not present

## 2023-05-12 DIAGNOSIS — F03A18 Unspecified dementia, mild, with other behavioral disturbance: Secondary | ICD-10-CM | POA: Diagnosis not present

## 2023-05-12 DIAGNOSIS — M150 Primary generalized (osteo)arthritis: Secondary | ICD-10-CM | POA: Diagnosis not present

## 2023-05-17 DIAGNOSIS — G4700 Insomnia, unspecified: Secondary | ICD-10-CM | POA: Diagnosis not present

## 2023-05-17 DIAGNOSIS — F03A3 Unspecified dementia, mild, with mood disturbance: Secondary | ICD-10-CM | POA: Diagnosis not present

## 2023-05-17 DIAGNOSIS — F03A18 Unspecified dementia, mild, with other behavioral disturbance: Secondary | ICD-10-CM | POA: Diagnosis not present

## 2023-05-17 DIAGNOSIS — M150 Primary generalized (osteo)arthritis: Secondary | ICD-10-CM | POA: Diagnosis not present

## 2023-05-17 DIAGNOSIS — F03A4 Unspecified dementia, mild, with anxiety: Secondary | ICD-10-CM | POA: Diagnosis not present

## 2023-05-17 DIAGNOSIS — F321 Major depressive disorder, single episode, moderate: Secondary | ICD-10-CM | POA: Diagnosis not present

## 2023-05-19 DIAGNOSIS — G4700 Insomnia, unspecified: Secondary | ICD-10-CM | POA: Diagnosis not present

## 2023-05-19 DIAGNOSIS — F03A4 Unspecified dementia, mild, with anxiety: Secondary | ICD-10-CM | POA: Diagnosis not present

## 2023-05-19 DIAGNOSIS — F03A18 Unspecified dementia, mild, with other behavioral disturbance: Secondary | ICD-10-CM | POA: Diagnosis not present

## 2023-05-19 DIAGNOSIS — F321 Major depressive disorder, single episode, moderate: Secondary | ICD-10-CM | POA: Diagnosis not present

## 2023-05-19 DIAGNOSIS — F03A3 Unspecified dementia, mild, with mood disturbance: Secondary | ICD-10-CM | POA: Diagnosis not present

## 2023-05-19 DIAGNOSIS — M150 Primary generalized (osteo)arthritis: Secondary | ICD-10-CM | POA: Diagnosis not present

## 2023-05-20 DIAGNOSIS — M25512 Pain in left shoulder: Secondary | ICD-10-CM | POA: Diagnosis not present

## 2023-05-23 DIAGNOSIS — G4700 Insomnia, unspecified: Secondary | ICD-10-CM | POA: Diagnosis not present

## 2023-05-23 DIAGNOSIS — F03A4 Unspecified dementia, mild, with anxiety: Secondary | ICD-10-CM | POA: Diagnosis not present

## 2023-05-23 DIAGNOSIS — M150 Primary generalized (osteo)arthritis: Secondary | ICD-10-CM | POA: Diagnosis not present

## 2023-05-23 DIAGNOSIS — F321 Major depressive disorder, single episode, moderate: Secondary | ICD-10-CM | POA: Diagnosis not present

## 2023-05-23 DIAGNOSIS — F03A3 Unspecified dementia, mild, with mood disturbance: Secondary | ICD-10-CM | POA: Diagnosis not present

## 2023-05-23 DIAGNOSIS — F03A18 Unspecified dementia, mild, with other behavioral disturbance: Secondary | ICD-10-CM | POA: Diagnosis not present

## 2023-05-25 DIAGNOSIS — F03A18 Unspecified dementia, mild, with other behavioral disturbance: Secondary | ICD-10-CM | POA: Diagnosis not present

## 2023-05-25 DIAGNOSIS — F321 Major depressive disorder, single episode, moderate: Secondary | ICD-10-CM | POA: Diagnosis not present

## 2023-05-25 DIAGNOSIS — F03A4 Unspecified dementia, mild, with anxiety: Secondary | ICD-10-CM | POA: Diagnosis not present

## 2023-05-25 DIAGNOSIS — G4700 Insomnia, unspecified: Secondary | ICD-10-CM | POA: Diagnosis not present

## 2023-05-25 DIAGNOSIS — F03A3 Unspecified dementia, mild, with mood disturbance: Secondary | ICD-10-CM | POA: Diagnosis not present

## 2023-05-25 DIAGNOSIS — M150 Primary generalized (osteo)arthritis: Secondary | ICD-10-CM | POA: Diagnosis not present

## 2023-05-27 DIAGNOSIS — E785 Hyperlipidemia, unspecified: Secondary | ICD-10-CM | POA: Diagnosis not present

## 2023-05-27 DIAGNOSIS — E559 Vitamin D deficiency, unspecified: Secondary | ICD-10-CM | POA: Diagnosis not present

## 2023-05-27 DIAGNOSIS — N3281 Overactive bladder: Secondary | ICD-10-CM | POA: Diagnosis not present

## 2023-05-27 DIAGNOSIS — G8929 Other chronic pain: Secondary | ICD-10-CM | POA: Diagnosis not present

## 2023-05-27 DIAGNOSIS — F321 Major depressive disorder, single episode, moderate: Secondary | ICD-10-CM | POA: Diagnosis not present

## 2023-05-27 DIAGNOSIS — N3946 Mixed incontinence: Secondary | ICD-10-CM | POA: Diagnosis not present

## 2023-05-27 DIAGNOSIS — M810 Age-related osteoporosis without current pathological fracture: Secondary | ICD-10-CM | POA: Diagnosis not present

## 2023-05-27 DIAGNOSIS — J4530 Mild persistent asthma, uncomplicated: Secondary | ICD-10-CM | POA: Diagnosis not present

## 2023-05-27 DIAGNOSIS — M5441 Lumbago with sciatica, right side: Secondary | ICD-10-CM | POA: Diagnosis not present

## 2023-05-27 DIAGNOSIS — Z79899 Other long term (current) drug therapy: Secondary | ICD-10-CM | POA: Diagnosis not present

## 2023-05-27 DIAGNOSIS — D649 Anemia, unspecified: Secondary | ICD-10-CM | POA: Diagnosis not present

## 2023-05-27 DIAGNOSIS — Z7982 Long term (current) use of aspirin: Secondary | ICD-10-CM | POA: Diagnosis not present

## 2023-05-27 DIAGNOSIS — Z791 Long term (current) use of non-steroidal anti-inflammatories (NSAID): Secondary | ICD-10-CM | POA: Diagnosis not present

## 2023-05-27 DIAGNOSIS — I341 Nonrheumatic mitral (valve) prolapse: Secondary | ICD-10-CM | POA: Diagnosis not present

## 2023-05-27 DIAGNOSIS — K219 Gastro-esophageal reflux disease without esophagitis: Secondary | ICD-10-CM | POA: Diagnosis not present

## 2023-05-27 DIAGNOSIS — I10 Essential (primary) hypertension: Secondary | ICD-10-CM | POA: Diagnosis not present

## 2023-05-27 DIAGNOSIS — F03A4 Unspecified dementia, mild, with anxiety: Secondary | ICD-10-CM | POA: Diagnosis not present

## 2023-05-27 DIAGNOSIS — N952 Postmenopausal atrophic vaginitis: Secondary | ICD-10-CM | POA: Diagnosis not present

## 2023-05-27 DIAGNOSIS — M5442 Lumbago with sciatica, left side: Secondary | ICD-10-CM | POA: Diagnosis not present

## 2023-05-27 DIAGNOSIS — F03A18 Unspecified dementia, mild, with other behavioral disturbance: Secondary | ICD-10-CM | POA: Diagnosis not present

## 2023-05-27 DIAGNOSIS — Z7951 Long term (current) use of inhaled steroids: Secondary | ICD-10-CM | POA: Diagnosis not present

## 2023-05-27 DIAGNOSIS — H903 Sensorineural hearing loss, bilateral: Secondary | ICD-10-CM | POA: Diagnosis not present

## 2023-05-27 DIAGNOSIS — F03A3 Unspecified dementia, mild, with mood disturbance: Secondary | ICD-10-CM | POA: Diagnosis not present

## 2023-05-27 DIAGNOSIS — G4700 Insomnia, unspecified: Secondary | ICD-10-CM | POA: Diagnosis not present

## 2023-05-27 DIAGNOSIS — M150 Primary generalized (osteo)arthritis: Secondary | ICD-10-CM | POA: Diagnosis not present

## 2023-05-30 DIAGNOSIS — R9431 Abnormal electrocardiogram [ECG] [EKG]: Secondary | ICD-10-CM | POA: Diagnosis not present

## 2023-05-30 DIAGNOSIS — R079 Chest pain, unspecified: Secondary | ICD-10-CM | POA: Diagnosis not present

## 2023-05-30 DIAGNOSIS — Z981 Arthrodesis status: Secondary | ICD-10-CM | POA: Diagnosis not present

## 2023-05-30 DIAGNOSIS — J45909 Unspecified asthma, uncomplicated: Secondary | ICD-10-CM | POA: Diagnosis not present

## 2023-05-30 DIAGNOSIS — R0602 Shortness of breath: Secondary | ICD-10-CM | POA: Diagnosis not present

## 2023-05-30 DIAGNOSIS — I252 Old myocardial infarction: Secondary | ICD-10-CM | POA: Diagnosis not present

## 2023-05-30 DIAGNOSIS — I491 Atrial premature depolarization: Secondary | ICD-10-CM | POA: Diagnosis not present

## 2023-05-30 DIAGNOSIS — Z7982 Long term (current) use of aspirin: Secondary | ICD-10-CM | POA: Diagnosis not present

## 2023-05-30 DIAGNOSIS — Z79899 Other long term (current) drug therapy: Secondary | ICD-10-CM | POA: Diagnosis not present

## 2023-05-30 DIAGNOSIS — I4711 Inappropriate sinus tachycardia, so stated: Secondary | ICD-10-CM | POA: Diagnosis not present

## 2023-05-30 DIAGNOSIS — Z792 Long term (current) use of antibiotics: Secondary | ICD-10-CM | POA: Diagnosis not present

## 2023-05-31 DIAGNOSIS — J4530 Mild persistent asthma, uncomplicated: Secondary | ICD-10-CM | POA: Diagnosis not present

## 2023-05-31 DIAGNOSIS — K219 Gastro-esophageal reflux disease without esophagitis: Secondary | ICD-10-CM | POA: Diagnosis not present

## 2023-05-31 DIAGNOSIS — I10 Essential (primary) hypertension: Secondary | ICD-10-CM | POA: Diagnosis not present

## 2023-06-06 DIAGNOSIS — F03A4 Unspecified dementia, mild, with anxiety: Secondary | ICD-10-CM | POA: Diagnosis not present

## 2023-06-06 DIAGNOSIS — F321 Major depressive disorder, single episode, moderate: Secondary | ICD-10-CM | POA: Diagnosis not present

## 2023-06-06 DIAGNOSIS — G4700 Insomnia, unspecified: Secondary | ICD-10-CM | POA: Diagnosis not present

## 2023-06-06 DIAGNOSIS — F03A3 Unspecified dementia, mild, with mood disturbance: Secondary | ICD-10-CM | POA: Diagnosis not present

## 2023-06-06 DIAGNOSIS — M150 Primary generalized (osteo)arthritis: Secondary | ICD-10-CM | POA: Diagnosis not present

## 2023-06-06 DIAGNOSIS — F03A18 Unspecified dementia, mild, with other behavioral disturbance: Secondary | ICD-10-CM | POA: Diagnosis not present

## 2023-06-07 ENCOUNTER — Inpatient Hospital Stay: Payer: Medicare Other | Admitting: Oncology

## 2023-06-09 DIAGNOSIS — B9689 Other specified bacterial agents as the cause of diseases classified elsewhere: Secondary | ICD-10-CM | POA: Diagnosis not present

## 2023-06-09 DIAGNOSIS — H9202 Otalgia, left ear: Secondary | ICD-10-CM | POA: Diagnosis not present

## 2023-06-09 DIAGNOSIS — J208 Acute bronchitis due to other specified organisms: Secondary | ICD-10-CM | POA: Diagnosis not present

## 2023-06-13 NOTE — Progress Notes (Unsigned)
 [facility]  [address] [place],  [place]  [address] [phone]  Clinic Day:  06/14/2023  Referring physician: Olga Berthold, NP  HISTORY OF PRESENT ILLNESS:  The patient is an 83 y.o. 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  She is currently taking letrozole  for her adjuvant endocrine therapy.  She comes in today for routine follow-up.  Since her last visit, the patient has been doing well.  She denies noticing any masses or other findings which concern her for early disease recurrence.    PHYSICAL EXAM:  Blood pressure (!) 158/81, pulse 98, temperature 97.8 F (36.6 C), temperature source Oral, resp. rate 14, height 5\' 3"  (1.6 m), weight 165 lb 9.6 oz (75.1 kg), SpO2 100%. Wt Readings from Last 3 Encounters:  06/14/23 165 lb 9.6 oz (75.1 kg)  02/23/23 165 lb (74.8 kg)  02/07/23 161 lb 12.8 oz (73.4 kg)   Body mass index is 29.33 kg/m. Performance status (ECOG): 1 - Symptomatic but completely ambulatory Physical Exam Constitutional:      Appearance: Normal appearance.  HENT:     Mouth/Throat:     Pharynx: Oropharynx is clear. No oropharyngeal exudate.  Cardiovascular:     Rate and Rhythm: Normal rate and regular rhythm.     Heart sounds: No murmur heard.    No friction rub. No gallop.  Pulmonary:     Breath sounds: Normal breath sounds.  Chest:  Breasts:    Right: No swelling, bleeding, inverted nipple, mass, nipple discharge or skin change.     Left: No swelling, bleeding, inverted nipple, mass, nipple discharge or skin change.  Abdominal:     General: Bowel sounds are normal. There is no distension.     Palpations: Abdomen is soft. There is no mass.     Tenderness: There is no abdominal tenderness.  Musculoskeletal:        General: No tenderness.     Cervical back: Normal range of motion and neck supple.     Right lower leg: No edema.     Left lower leg:  No edema.  Lymphadenopathy:     Cervical: No cervical adenopathy.     Right cervical: No superficial, deep or posterior cervical adenopathy.    Left cervical: No superficial, deep or posterior cervical adenopathy.     Upper Body:     Right upper body: No supraclavicular or axillary adenopathy.     Left upper body: No supraclavicular or axillary adenopathy.     Lower Body: No right inguinal adenopathy. No left inguinal adenopathy.  Skin:    Coloration: Skin is not jaundiced.     Findings: No lesion or rash.  Neurological:     General: No focal deficit present.     Mental Status: She is alert and oriented to person, place, and time. Mental status is at baseline.  Psychiatric:        Mood and Affect: Mood normal.        Behavior: Behavior normal.        Thought Content: Thought content normal.        Judgment: Judgment normal.    ASSESSMENT & PLAN:  An 83 y.o. 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  Based upon her clinical breast exam today, the patient remains disease-free.  She knows to continue taking  her letrozole  on a daily basis to complete 5 years of adjuvant endocrine therapy.  I will see her back in 4 months for her next clinical breast exam.  Her annual mammogram will be scheduled before her next visit for her continued radiographic breast cancer surveillance.  If her next mammogram and clinical breast exam both come back normal, I will begin spacing all future visits out to every 6 months.  The patient understands all the plans discussed today and is in agreement with them.  Aeron Lheureux Felicia Horde, MD

## 2023-06-14 ENCOUNTER — Inpatient Hospital Stay: Attending: Oncology | Admitting: Oncology

## 2023-06-14 ENCOUNTER — Telehealth: Payer: Self-pay | Admitting: Oncology

## 2023-06-14 VITALS — BP 158/81 | HR 98 | Temp 97.8°F | Resp 14 | Ht 63.0 in | Wt 165.6 lb

## 2023-06-14 DIAGNOSIS — C50412 Malignant neoplasm of upper-outer quadrant of left female breast: Secondary | ICD-10-CM

## 2023-06-14 DIAGNOSIS — Z79811 Long term (current) use of aromatase inhibitors: Secondary | ICD-10-CM | POA: Insufficient documentation

## 2023-06-14 DIAGNOSIS — Z170 Estrogen receptor positive status [ER+]: Secondary | ICD-10-CM | POA: Diagnosis not present

## 2023-06-14 DIAGNOSIS — C50912 Malignant neoplasm of unspecified site of left female breast: Secondary | ICD-10-CM | POA: Diagnosis not present

## 2023-06-14 NOTE — Telephone Encounter (Signed)
 Patient has been scheduled for follow-up visit per 06/14/23 LOS.  Pt given an appt calendar with date and time.

## 2023-06-15 DIAGNOSIS — M150 Primary generalized (osteo)arthritis: Secondary | ICD-10-CM | POA: Diagnosis not present

## 2023-06-15 DIAGNOSIS — F321 Major depressive disorder, single episode, moderate: Secondary | ICD-10-CM | POA: Diagnosis not present

## 2023-06-15 DIAGNOSIS — G4700 Insomnia, unspecified: Secondary | ICD-10-CM | POA: Diagnosis not present

## 2023-06-15 DIAGNOSIS — F03A18 Unspecified dementia, mild, with other behavioral disturbance: Secondary | ICD-10-CM | POA: Diagnosis not present

## 2023-06-15 DIAGNOSIS — F03A4 Unspecified dementia, mild, with anxiety: Secondary | ICD-10-CM | POA: Diagnosis not present

## 2023-06-15 DIAGNOSIS — F03A3 Unspecified dementia, mild, with mood disturbance: Secondary | ICD-10-CM | POA: Diagnosis not present

## 2023-06-16 DIAGNOSIS — N3281 Overactive bladder: Secondary | ICD-10-CM | POA: Diagnosis not present

## 2023-06-16 NOTE — Procedures (Signed)
  DATE/TIME: 06/16/2023 1:56 PM  PRE-PROCEDURE DIAGNOSIS: Refractory Overactive Bladder  PROCEDURE: Cystourethroscopy with Botulinum Toxin Type A (Botox) Injection  The alternatives, risks and benefits of the procedure were explained to the patient. Risks including, but not limited to discomfort, pain, bleeding, infection, injury to nearby structures, inability to perform the procedure, failure of the procedure, difficulty voiding potentially requiring catheterization, and possible urinary tract infection requiring antibiotic treatment were discussed.  All questions were answered and the patient elected to proceed.  The patient received pre-procedure teaching and expressed understanding.  Written consent was obtained.  See separate timeout documentation.  PRE-PROCEDURE PREPARATION: There were no vitals filed for this visit. Pre-procedure pain score:    Urinalysis: No results found for this visit on 06/16/23.  See nursing notes for documentation of clean intermittent self catheterization training, pregnancy risk and antibiotic prophylaxis compliance.    PROCEDURE IN DETAIL: The patient was positioned in the cystoscopy chair in the dorsal lithotomy position. The urethra was prepped in the usual fashion.  50 mL of 2% lidocaine  was instilled into the bladder, and 5mL of 2% lidocaine  jelly was inserted into the urethra. Twenty minutes was allowed for local anesthetic to take effect. Botulinum Toxin Type A: 200 units was diluted in 10mL of sodium chloride  diluent  and 0.72mL of Bludigo (indigotindisulfonate sodium) were added for visualization.  Total dose was injected.  The 12 degree scope with a 22 gauge disposable injection needle and 21 French obturator sheath was used to inject the Botox solution at 15-20 injection sites followed by a 1mL flush of NS.  The base of the bladder was injected in a grid like fashion while avoiding the trigone region. The injections were performed with 100-289mL of sterile  fluid in the bladder.   Excellent hemostasis was noted and the cystoscope was removed.     Cystoscopic findings: Bladder: normal and trabeculations Urethra: normal, Ureters: normal orthotopic location  EBL was minimal Patient tolerated the procedure well.  JOHN ERIC MARYCLARE, MD

## 2023-06-22 DIAGNOSIS — F03A4 Unspecified dementia, mild, with anxiety: Secondary | ICD-10-CM | POA: Diagnosis not present

## 2023-06-22 DIAGNOSIS — G4700 Insomnia, unspecified: Secondary | ICD-10-CM | POA: Diagnosis not present

## 2023-06-22 DIAGNOSIS — F03A3 Unspecified dementia, mild, with mood disturbance: Secondary | ICD-10-CM | POA: Diagnosis not present

## 2023-06-22 DIAGNOSIS — M150 Primary generalized (osteo)arthritis: Secondary | ICD-10-CM | POA: Diagnosis not present

## 2023-06-22 DIAGNOSIS — F03A18 Unspecified dementia, mild, with other behavioral disturbance: Secondary | ICD-10-CM | POA: Diagnosis not present

## 2023-06-22 DIAGNOSIS — F321 Major depressive disorder, single episode, moderate: Secondary | ICD-10-CM | POA: Diagnosis not present

## 2023-07-04 DIAGNOSIS — Z9229 Personal history of other drug therapy: Secondary | ICD-10-CM | POA: Diagnosis not present

## 2023-07-04 DIAGNOSIS — R399 Unspecified symptoms and signs involving the genitourinary system: Secondary | ICD-10-CM | POA: Diagnosis not present

## 2023-07-08 DIAGNOSIS — R399 Unspecified symptoms and signs involving the genitourinary system: Secondary | ICD-10-CM | POA: Diagnosis not present

## 2023-07-08 DIAGNOSIS — R300 Dysuria: Secondary | ICD-10-CM | POA: Diagnosis not present

## 2023-07-08 DIAGNOSIS — F419 Anxiety disorder, unspecified: Secondary | ICD-10-CM | POA: Diagnosis not present

## 2023-07-08 DIAGNOSIS — I10 Essential (primary) hypertension: Secondary | ICD-10-CM | POA: Diagnosis not present

## 2023-07-08 DIAGNOSIS — F321 Major depressive disorder, single episode, moderate: Secondary | ICD-10-CM | POA: Diagnosis not present

## 2023-07-08 DIAGNOSIS — M5441 Lumbago with sciatica, right side: Secondary | ICD-10-CM | POA: Diagnosis not present

## 2023-07-08 DIAGNOSIS — E559 Vitamin D deficiency, unspecified: Secondary | ICD-10-CM | POA: Diagnosis not present

## 2023-07-08 DIAGNOSIS — G8929 Other chronic pain: Secondary | ICD-10-CM | POA: Diagnosis not present

## 2023-07-08 DIAGNOSIS — D539 Nutritional anemia, unspecified: Secondary | ICD-10-CM | POA: Diagnosis not present

## 2023-07-08 DIAGNOSIS — E538 Deficiency of other specified B group vitamins: Secondary | ICD-10-CM | POA: Diagnosis not present

## 2023-07-08 DIAGNOSIS — M5442 Lumbago with sciatica, left side: Secondary | ICD-10-CM | POA: Diagnosis not present

## 2023-07-08 DIAGNOSIS — R739 Hyperglycemia, unspecified: Secondary | ICD-10-CM | POA: Diagnosis not present

## 2023-07-08 DIAGNOSIS — E785 Hyperlipidemia, unspecified: Secondary | ICD-10-CM | POA: Diagnosis not present

## 2023-07-08 DIAGNOSIS — M1990 Unspecified osteoarthritis, unspecified site: Secondary | ICD-10-CM | POA: Diagnosis not present

## 2023-07-11 DIAGNOSIS — S0003XA Contusion of scalp, initial encounter: Secondary | ICD-10-CM | POA: Diagnosis not present

## 2023-07-11 DIAGNOSIS — I10 Essential (primary) hypertension: Secondary | ICD-10-CM | POA: Diagnosis not present

## 2023-07-11 DIAGNOSIS — S51011A Laceration without foreign body of right elbow, initial encounter: Secondary | ICD-10-CM | POA: Diagnosis not present

## 2023-07-11 DIAGNOSIS — R072 Precordial pain: Secondary | ICD-10-CM | POA: Diagnosis not present

## 2023-07-11 DIAGNOSIS — W19XXXA Unspecified fall, initial encounter: Secondary | ICD-10-CM | POA: Diagnosis not present

## 2023-07-11 DIAGNOSIS — R000 Tachycardia, unspecified: Secondary | ICD-10-CM | POA: Diagnosis not present

## 2023-07-11 DIAGNOSIS — R531 Weakness: Secondary | ICD-10-CM | POA: Diagnosis not present

## 2023-07-11 DIAGNOSIS — S20211A Contusion of right front wall of thorax, initial encounter: Secondary | ICD-10-CM | POA: Diagnosis not present

## 2023-07-11 DIAGNOSIS — S81812A Laceration without foreign body, left lower leg, initial encounter: Secondary | ICD-10-CM | POA: Diagnosis not present

## 2023-07-11 DIAGNOSIS — M79651 Pain in right thigh: Secondary | ICD-10-CM | POA: Diagnosis not present

## 2023-07-11 DIAGNOSIS — F29 Unspecified psychosis not due to a substance or known physiological condition: Secondary | ICD-10-CM | POA: Diagnosis not present

## 2023-07-11 DIAGNOSIS — S80811A Abrasion, right lower leg, initial encounter: Secondary | ICD-10-CM | POA: Diagnosis not present

## 2023-07-11 DIAGNOSIS — S0081XA Abrasion of other part of head, initial encounter: Secondary | ICD-10-CM | POA: Diagnosis not present

## 2023-07-11 DIAGNOSIS — M25461 Effusion, right knee: Secondary | ICD-10-CM | POA: Diagnosis not present

## 2023-07-11 DIAGNOSIS — R262 Difficulty in walking, not elsewhere classified: Secondary | ICD-10-CM | POA: Diagnosis not present

## 2023-07-11 DIAGNOSIS — S50811A Abrasion of right forearm, initial encounter: Secondary | ICD-10-CM | POA: Diagnosis not present

## 2023-07-12 DIAGNOSIS — S51001A Unspecified open wound of right elbow, initial encounter: Secondary | ICD-10-CM | POA: Diagnosis not present

## 2023-07-15 DIAGNOSIS — T07XXXA Unspecified multiple injuries, initial encounter: Secondary | ICD-10-CM | POA: Diagnosis not present

## 2023-07-15 DIAGNOSIS — I10 Essential (primary) hypertension: Secondary | ICD-10-CM | POA: Diagnosis not present

## 2023-07-15 DIAGNOSIS — T148XXA Other injury of unspecified body region, initial encounter: Secondary | ICD-10-CM | POA: Diagnosis not present

## 2023-07-15 DIAGNOSIS — F03A3 Unspecified dementia, mild, with mood disturbance: Secondary | ICD-10-CM | POA: Diagnosis not present

## 2023-07-15 DIAGNOSIS — R339 Retention of urine, unspecified: Secondary | ICD-10-CM | POA: Diagnosis not present

## 2023-07-15 DIAGNOSIS — R262 Difficulty in walking, not elsewhere classified: Secondary | ICD-10-CM | POA: Diagnosis not present

## 2023-07-19 DIAGNOSIS — F419 Anxiety disorder, unspecified: Secondary | ICD-10-CM | POA: Diagnosis not present

## 2023-07-19 DIAGNOSIS — E785 Hyperlipidemia, unspecified: Secondary | ICD-10-CM | POA: Diagnosis not present

## 2023-07-19 DIAGNOSIS — F32A Depression, unspecified: Secondary | ICD-10-CM | POA: Diagnosis not present

## 2023-07-19 DIAGNOSIS — Z7951 Long term (current) use of inhaled steroids: Secondary | ICD-10-CM | POA: Diagnosis not present

## 2023-07-19 DIAGNOSIS — M810 Age-related osteoporosis without current pathological fracture: Secondary | ICD-10-CM | POA: Diagnosis not present

## 2023-07-19 DIAGNOSIS — J45909 Unspecified asthma, uncomplicated: Secondary | ICD-10-CM | POA: Diagnosis not present

## 2023-07-19 DIAGNOSIS — Z791 Long term (current) use of non-steroidal anti-inflammatories (NSAID): Secondary | ICD-10-CM | POA: Diagnosis not present

## 2023-07-19 DIAGNOSIS — Z8673 Personal history of transient ischemic attack (TIA), and cerebral infarction without residual deficits: Secondary | ICD-10-CM | POA: Diagnosis not present

## 2023-07-19 DIAGNOSIS — M1990 Unspecified osteoarthritis, unspecified site: Secondary | ICD-10-CM | POA: Diagnosis not present

## 2023-07-19 DIAGNOSIS — E559 Vitamin D deficiency, unspecified: Secondary | ICD-10-CM | POA: Diagnosis not present

## 2023-07-19 DIAGNOSIS — S81011D Laceration without foreign body, right knee, subsequent encounter: Secondary | ICD-10-CM | POA: Diagnosis not present

## 2023-07-19 DIAGNOSIS — S51011D Laceration without foreign body of right elbow, subsequent encounter: Secondary | ICD-10-CM | POA: Diagnosis not present

## 2023-07-19 DIAGNOSIS — N3281 Overactive bladder: Secondary | ICD-10-CM | POA: Diagnosis not present

## 2023-07-19 DIAGNOSIS — D649 Anemia, unspecified: Secondary | ICD-10-CM | POA: Diagnosis not present

## 2023-07-19 DIAGNOSIS — G4700 Insomnia, unspecified: Secondary | ICD-10-CM | POA: Diagnosis not present

## 2023-07-19 DIAGNOSIS — Z9181 History of falling: Secondary | ICD-10-CM | POA: Diagnosis not present

## 2023-07-19 DIAGNOSIS — Z87442 Personal history of urinary calculi: Secondary | ICD-10-CM | POA: Diagnosis not present

## 2023-07-19 DIAGNOSIS — Z466 Encounter for fitting and adjustment of urinary device: Secondary | ICD-10-CM | POA: Diagnosis not present

## 2023-07-19 DIAGNOSIS — H903 Sensorineural hearing loss, bilateral: Secondary | ICD-10-CM | POA: Diagnosis not present

## 2023-07-19 DIAGNOSIS — Z853 Personal history of malignant neoplasm of breast: Secondary | ICD-10-CM | POA: Diagnosis not present

## 2023-07-19 DIAGNOSIS — I10 Essential (primary) hypertension: Secondary | ICD-10-CM | POA: Diagnosis not present

## 2023-07-21 DIAGNOSIS — I10 Essential (primary) hypertension: Secondary | ICD-10-CM | POA: Diagnosis not present

## 2023-07-21 DIAGNOSIS — Z466 Encounter for fitting and adjustment of urinary device: Secondary | ICD-10-CM | POA: Diagnosis not present

## 2023-07-21 DIAGNOSIS — J45909 Unspecified asthma, uncomplicated: Secondary | ICD-10-CM | POA: Diagnosis not present

## 2023-07-21 DIAGNOSIS — S81011D Laceration without foreign body, right knee, subsequent encounter: Secondary | ICD-10-CM | POA: Diagnosis not present

## 2023-07-21 DIAGNOSIS — S51011D Laceration without foreign body of right elbow, subsequent encounter: Secondary | ICD-10-CM | POA: Diagnosis not present

## 2023-07-21 DIAGNOSIS — D649 Anemia, unspecified: Secondary | ICD-10-CM | POA: Diagnosis not present

## 2023-07-25 DIAGNOSIS — I10 Essential (primary) hypertension: Secondary | ICD-10-CM | POA: Diagnosis not present

## 2023-07-25 DIAGNOSIS — J45909 Unspecified asthma, uncomplicated: Secondary | ICD-10-CM | POA: Diagnosis not present

## 2023-07-25 DIAGNOSIS — Z466 Encounter for fitting and adjustment of urinary device: Secondary | ICD-10-CM | POA: Diagnosis not present

## 2023-07-25 DIAGNOSIS — S81011D Laceration without foreign body, right knee, subsequent encounter: Secondary | ICD-10-CM | POA: Diagnosis not present

## 2023-07-25 DIAGNOSIS — S51011D Laceration without foreign body of right elbow, subsequent encounter: Secondary | ICD-10-CM | POA: Diagnosis not present

## 2023-07-25 DIAGNOSIS — D649 Anemia, unspecified: Secondary | ICD-10-CM | POA: Diagnosis not present

## 2023-07-27 DIAGNOSIS — J45909 Unspecified asthma, uncomplicated: Secondary | ICD-10-CM | POA: Diagnosis not present

## 2023-07-27 DIAGNOSIS — Z466 Encounter for fitting and adjustment of urinary device: Secondary | ICD-10-CM | POA: Diagnosis not present

## 2023-07-27 DIAGNOSIS — S81011D Laceration without foreign body, right knee, subsequent encounter: Secondary | ICD-10-CM | POA: Diagnosis not present

## 2023-07-27 DIAGNOSIS — D649 Anemia, unspecified: Secondary | ICD-10-CM | POA: Diagnosis not present

## 2023-07-27 DIAGNOSIS — S51011D Laceration without foreign body of right elbow, subsequent encounter: Secondary | ICD-10-CM | POA: Diagnosis not present

## 2023-07-27 DIAGNOSIS — I10 Essential (primary) hypertension: Secondary | ICD-10-CM | POA: Diagnosis not present

## 2023-07-28 DIAGNOSIS — N3281 Overactive bladder: Secondary | ICD-10-CM | POA: Diagnosis not present

## 2023-07-28 DIAGNOSIS — R339 Retention of urine, unspecified: Secondary | ICD-10-CM | POA: Diagnosis not present

## 2023-07-28 DIAGNOSIS — N390 Urinary tract infection, site not specified: Secondary | ICD-10-CM | POA: Diagnosis not present

## 2023-08-01 DIAGNOSIS — I10 Essential (primary) hypertension: Secondary | ICD-10-CM | POA: Diagnosis not present

## 2023-08-01 DIAGNOSIS — S81011D Laceration without foreign body, right knee, subsequent encounter: Secondary | ICD-10-CM | POA: Diagnosis not present

## 2023-08-01 DIAGNOSIS — S51011D Laceration without foreign body of right elbow, subsequent encounter: Secondary | ICD-10-CM | POA: Diagnosis not present

## 2023-08-01 DIAGNOSIS — J45909 Unspecified asthma, uncomplicated: Secondary | ICD-10-CM | POA: Diagnosis not present

## 2023-08-01 DIAGNOSIS — Z466 Encounter for fitting and adjustment of urinary device: Secondary | ICD-10-CM | POA: Diagnosis not present

## 2023-08-01 DIAGNOSIS — D649 Anemia, unspecified: Secondary | ICD-10-CM | POA: Diagnosis not present

## 2023-08-02 DIAGNOSIS — M1712 Unilateral primary osteoarthritis, left knee: Secondary | ICD-10-CM | POA: Diagnosis not present

## 2023-08-02 DIAGNOSIS — M170 Bilateral primary osteoarthritis of knee: Secondary | ICD-10-CM | POA: Diagnosis not present

## 2023-08-02 DIAGNOSIS — M1711 Unilateral primary osteoarthritis, right knee: Secondary | ICD-10-CM | POA: Diagnosis not present

## 2023-08-03 DIAGNOSIS — C50212 Malignant neoplasm of upper-inner quadrant of left female breast: Secondary | ICD-10-CM | POA: Diagnosis not present

## 2023-08-03 DIAGNOSIS — Z170 Estrogen receptor positive status [ER+]: Secondary | ICD-10-CM | POA: Diagnosis not present

## 2023-08-04 DIAGNOSIS — J45909 Unspecified asthma, uncomplicated: Secondary | ICD-10-CM | POA: Diagnosis not present

## 2023-08-04 DIAGNOSIS — I10 Essential (primary) hypertension: Secondary | ICD-10-CM | POA: Diagnosis not present

## 2023-08-04 DIAGNOSIS — S81011D Laceration without foreign body, right knee, subsequent encounter: Secondary | ICD-10-CM | POA: Diagnosis not present

## 2023-08-04 DIAGNOSIS — Z466 Encounter for fitting and adjustment of urinary device: Secondary | ICD-10-CM | POA: Diagnosis not present

## 2023-08-04 DIAGNOSIS — S51011D Laceration without foreign body of right elbow, subsequent encounter: Secondary | ICD-10-CM | POA: Diagnosis not present

## 2023-08-04 DIAGNOSIS — D649 Anemia, unspecified: Secondary | ICD-10-CM | POA: Diagnosis not present

## 2023-08-08 ENCOUNTER — Ambulatory Visit: Admitting: Physician Assistant

## 2023-08-08 DIAGNOSIS — N390 Urinary tract infection, site not specified: Secondary | ICD-10-CM | POA: Diagnosis not present

## 2023-08-08 NOTE — Progress Notes (Incomplete)
 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 RH female with a history off hypertension, hyperlipidemia, stage Ia left breast cancer on letrozole , recurrent UTIs, anxiety, depression, asthma, history of remote TIA in [place]  (2010), anemia, vitamin D  deficiency, vitamin B12 deficiency, arthritis with chronic low back pain, GERD, and a diagnosis of mixed dementia with concern for vascular and Alzheimer's disease seen today in follow up for memory loss. Patient is currently on memantine  5 mg twice daily (chronic leg cramps and chronic diarrhea)***.  She is limited on her ADLs due to chronic pain issues.  She no longer drives***    Follow up in   months. Continue memantine  increased to 10 mg twice daily, side effects discussed*** Recommend psychotherapy for psychiatric distress Recommend good control of her cardiovascular risk factors Continue to control mood as per PCP     Subjective:    This patient is accompanied in the office by her husband*** who supplements the history.  Previous records as well as any outside records available were reviewed prior to todays visit. Patient was last seen on 02/23/2023 with MMSE 27/30***   Any changes in memory since last visit?   She has difficulty with names given dose of people that she knows, and recent conversations.  She does not enjoy doing any brain stimulating exercises.  She enjoys watching TV especially game shows and westerns. repeats oneself?  Endorsed Disoriented when walking into a room? Denies ***  Leaving objects?  May misplace things and then cannot find them***  Wandering behavior?  denies   Any personality changes since last visit?  Denies.   Any worsening depression?:  Denies.  Severe GAD and depression for at least 50 years.  Hallucinations or paranoia?  Denies.   Seizures? denies    Any sleep changes?  Denies vivid dreams, REM behavior or  sleepwalking   Sleep apnea?   Denies.   Any hygiene concerns? Denies.  Independent of bathing and dressing?  Endorsed  Does the patient needs help with medications?   Husband is in charge *** Who is in charge of the finances?  Husband is in charge   *** Any changes in appetite?  denies ***   Patient have trouble swallowing?  She has a history of GERD esophageal dilatations. Does the patient cook? No Any headaches?   denies   Any vision changes?*** Chronic back pain endorsed, she required major back surgeries in the past Ambulates with difficulty?  Endorsed, she has chronic gait disturbance, chronic back pain with weakness, she takes nonopioid and opioid analgesics.  She has been seen by neurosurgery in the past.  Uses a walker to prevent falls*** Recent falls or head injuries? Denies.     Unilateral weakness, numbness or tingling? denies   Any tremors?  Denies  *** Any anosmia?  Denies   Any incontinence of urine?  She has OAB, chronic cystitis due to decreased UO, follows with urology last UTI in June 2025***  Any bowel dysfunction?  Any remittent diarrhea  patient lives with her husband*** Does the patient drive? No longer drives ***  Neuropsych evaluation 03/30/2023, Dr. Richie: Briefly, results suggested primary impairments surrounding attention/concentration, verbal fluency (phonemic slightly worse than semantic), and encoding (i.e., learning) aspects of memory. Further performance variability was exhibited across processing speed, confrontation naming, visuospatial abilities, and both delayed retrieval and recognition/consolidation aspects of memory. The etiology for her dementia presentation remains somewhat uncertain. Past  neuroimaging suggested advanced microvascular ischemic disease and patterns of impairment across testing aligns with this finding reasonably well. Additionally, across mood-related questionnaires, Ms. Aung reported acute symptoms of severe anxiety and severe  depression. The severity of psychiatric distress could explain concern surrounding auditory hallucinations. There certainly remains the potential for a primary vascular cause for ongoing impairment (i.e., a vascular dementia presentation), exacerbated by the presence of severe psychiatric distress. Frequent headaches, chronic pain, and medication side effects (especially clonazepam /Klonopin  and tramadol/Ultram) may serve to further exacerbate deficits. Despite impairments across initial learning trials of memory testing, Ms. Brune was able to demonstrate at least some retention across a story learning task (retention rates were 0% and 25% across figure and list-based tasks). She also benefited from cueing across list and figure tasks. This would be somewhat inconsistent with a classic Alzheimer's disease. Variable but largely adequate confrontation naming is also encouraging in this regard. Ultimately, while concurrent underlying Alzheimer's disease is plausible and cannot be ruled out, I am unable to rule it in with confidence based upon current testing. Continued medical monitoring will be important moving forward.    Initial visit 06/22/2022 How long did patient have memory difficulties?  Patient reports that for the last year she has been noticing changes in her memory.  Her husband states that these changes have been worse since sustaining a mechanical fall in February 2024, hitting her face in the left side of her head .  She did not lose consciousness at that time.  She reports not remembering people's names, even those that she knows, sometimes calling her husband daddy.  having trouble finding things around the house, or remembering a conversation.  She continues to be as active as I can , enjoying yard work and other outdoor activities although not as often as before due to mobility issues.  She does not enjoy doing crossword puzzles, word finding.  She enjoys watching TV game shows.  repeats  oneself?  Endorsed Disoriented when walking into a room?  Patient denies  Leaving objects in unusual places?   denies   Wandering behavior? denies   Any personality changes ? denies   Any history of depression?: Endorsed since 1972. She has severe GAD.  Hallucinations or paranoia? Endorsed.  Sometimes she thinks that her parents are sleeping in her house and ask where they are .  Seizures?  Patient denies, but husband reports that these may have been several decades ago, and only 1 episode, while in [place] .  No recurrence. Any sleep changes?  Sleeps fairly well.  Denies vivid dreams, REM behavior or sleepwalking   Sleep apnea? denies   Any hygiene concerns?  denies   Independent of bathing and dressing?  Endorsed  Does the patient need help with medications?  Husband is in charge for the last 4 weeks as she was forgetting to take them  Who is in charge of the finances?  Husband  is in charge    Any changes in appetite?  Not as good as before. Drinks plenty water      Patient have trouble swallowing?  Had esophageal dilatation  Does the patient cook?  Not right now. Forgets common recipes   Any kitchen accidents such as leaving the stove on? Patient denies   Any headaches? Since falling she has some headaches in the frontal area, worse in the morning. Takes tylenol  3-4/day and tramadol occasionally. Chronic back pain?  Endorsed, had major back surgery several times Ambulates with difficulty?  Endorsed, the patient  has chronic gait disturbance, with pain in chronic back pain and weakness.  She tried PT at home, and she is taking nonopioid and opioid analgesics.  She has had some recent falls, stating that the legs give up on her .  Of note, per chart report she has chronic low back pain with bilateral sciatica. Been seen by neurosurgery in the past.  Recent falls or head injuries? Endorsed. Denies LOC. Legs gave out.  She has been doing physical therapy, and uses the walker. Vision  changes? Denies  Unilateral weakness, numbness or tingling?  denies   Any tremors?  denies   Any anosmia?  denies   Any incontinence of urine?  She has a history of OAB, and chronic cystitis with decreased UO. Urology has followed in the past , but she was in a lot of meds, she went to [facility] and placed her on antibiotics that did not work, then saw a PA in urogynecology, was on oxybutynin  but had to discontinue it due to memory concerns, most recently tried Botox 3 weeks ago with nitrofurantoin.   Any bowel dysfunction? denies      Patient lives with her husband.    History of heavy alcohol intake? denies   History of heavy tobacco use? denies   Family history of dementia?   2 sisters  had dementia, one possibly with AD   Does patient drive? No    MRI of the brain 2024, personally reviewed, remarkable for advanced chronic small vessel ischemic disease within the cerebral white matter, as well as chronic small vessel disease and changes in the basal ganglia and pons as well as mild to moderate for age generalized cerebral atrophy, no acute findings.   PREVIOUS MEDICATIONS:   CURRENT MEDICATIONS:  Outpatient Encounter Medications as of 08/08/2023  Medication Sig   acetaminophen  (TYLENOL ) 325 MG tablet Take 325 mg by mouth every 6 (six) hours as needed for moderate pain (pain score 4-6).   albuterol  (PROVENTIL ) (2.5 MG/3ML) 0.083% nebulizer solution Take 2.5 mg by nebulization every 6 (six) hours as needed.   albuterol  (VENTOLIN  HFA) 108 (90 Base) MCG/ACT inhaler Inhale into the lungs every 6 (six) hours as needed.   aspirin EC 81 MG tablet Take 81 mg by mouth daily.    Calcium Carbonate-Vitamin D  (CALTRATE 600+D PO) Take by mouth.   celecoxib (CELEBREX) 100 MG capsule Take 100 mg by mouth 2 (two) times daily.   clonazePAM  (KLONOPIN ) 0.5 MG tablet Take 1 mg by mouth 2 (two) times daily.    diclofenac (CATAFLAM) 50 MG tablet Take 50 mg by mouth 3 (three) times daily.   diphenoxylate -atropine   (LOMOTIL ) 2.5-0.025 MG tablet Take 2 tablets by mouth 4 (four) times daily as needed for diarrhea or loose stools.   DULoxetine  (CYMBALTA ) 30 MG capsule Take 30 mg by mouth daily.   esomeprazole (NEXIUM) 40 MG capsule Take 40 mg by mouth 2 (two) times daily.   estradiol (ESTRACE) 0.1 MG/GM vaginal cream Place 1 Applicatorful vaginally at bedtime.   ezetimibe  (ZETIA ) 10 MG tablet Take 10 mg by mouth daily.   fluticasone -salmeterol (ADVAIR) 250-50 MCG/ACT AEPB Inhale 1 puff into the lungs in the morning and at bedtime.   fosfomycin (MONUROL) 3 g PACK Take 3 g by mouth once a week.   gabapentin  (NEURONTIN ) 300 MG capsule Take 300 mg by mouth 3 (three) times daily.   memantine  (NAMENDA ) 5 MG tablet Take 1 tablet (5 mg total) by mouth 2 (two) times daily.  nitroGLYCERIN  (NITROSTAT ) 0.4 MG SL tablet Place 0.4 mg under the tongue every 5 (five) minutes as needed for chest pain.    polyethylene glycol (MIRALAX / GLYCOLAX) packet Take 17 g by mouth daily as needed for mild constipation.    potassium chloride  SA (K-DUR,KLOR-CON ) 20 MEQ tablet Take 20 mEq by mouth daily.    telmisartan (MICARDIS) 40 MG tablet Take 40 mg by mouth daily.   traMADol (ULTRAM) 50 MG tablet Take 50 mg by mouth every 12 (twelve) hours as needed for moderate pain.    No facility-administered encounter medications on file as of 08/08/2023.       02/23/2023   12:00 PM  MMSE - Mini Mental State Exam  Orientation to time 3  Orientation to Place 5  Registration 3  Attention/ Calculation 5  Recall 2  Language- name 2 objects 2  Language- repeat 1  Language- follow 3 step command 3  Language- read & follow direction 1  Write a sentence 1  Copy design 1  Total score 27      06/22/2022   11:00 AM  Montreal Cognitive Assessment   Visuospatial/ Executive (0/5) 1  Naming (0/3) 3  Attention: Read list of digits (0/2) 2  Attention: Read list of letters (0/1) 1  Attention: Serial 7 subtraction starting at 100 (0/3) 0   Language: Repeat phrase (0/2) 0  Language : Fluency (0/1) 0  Abstraction (0/2) 1  Delayed Recall (0/5) 0  Orientation (0/6) 4  Total 12  Adjusted Score (based on education) 13    Objective:     PHYSICAL EXAMINATION:    VITALS:  There were no vitals filed for this visit.  GEN:  The patient appears stated age and is in NAD. HEENT:  Normocephalic, atraumatic.   Neurological examination:  General: NAD, well-groomed, appears stated age. Orientation: The patient is alert. Oriented to person, place and date Cranial nerves: There is good facial symmetry.The speech is fluent and clear. No aphasia or dysarthria. Fund of knowledge is appropriate. Recent and remote memory are impaired. Attention and concentration are reduced. Able to name objects and repeat phrases.  Hearing is intact to conversational tone. *** Sensation: Sensation is intact to light touch throughout Motor: Strength is at least antigravity x4. DTR's 2/4 in UE/LE     Movement examination: Tone: There is normal tone in the UE/LE Abnormal movements:  no tremor.  No myoclonus.  No asterixis.   Coordination:  There is no decremation with RAM's. Normal finger to nose  Gait and Station: The patient has no*** difficulty arising out of a deep-seated chair without the use of the hands. The patient's stride length is good.  Gait is cautious and narrow.    Thank you for allowing us  the opportunity to participate in the care of this nice patient. Please do not hesitate to contact us  for any questions or concerns.   Total time spent on today's visit was *** minutes dedicated to this patient today, preparing to see patient, examining the patient, ordering tests and/or medications and counseling the patient, documenting clinical information in the EHR or other health record, independently interpreting results and communicating results to the patient/family, discussing treatment and goals, answering patient's questions and coordinating  care.  Cc:  Erick Greig LABOR, NP  Camie Sevin 08/08/2023 6:44 AM

## 2023-08-09 DIAGNOSIS — Z170 Estrogen receptor positive status [ER+]: Secondary | ICD-10-CM | POA: Diagnosis not present

## 2023-08-09 DIAGNOSIS — C50212 Malignant neoplasm of upper-inner quadrant of left female breast: Secondary | ICD-10-CM | POA: Diagnosis not present

## 2023-08-10 DIAGNOSIS — S51011D Laceration without foreign body of right elbow, subsequent encounter: Secondary | ICD-10-CM | POA: Diagnosis not present

## 2023-08-10 DIAGNOSIS — I10 Essential (primary) hypertension: Secondary | ICD-10-CM | POA: Diagnosis not present

## 2023-08-10 DIAGNOSIS — S81011D Laceration without foreign body, right knee, subsequent encounter: Secondary | ICD-10-CM | POA: Diagnosis not present

## 2023-08-10 DIAGNOSIS — D649 Anemia, unspecified: Secondary | ICD-10-CM | POA: Diagnosis not present

## 2023-08-10 DIAGNOSIS — J45909 Unspecified asthma, uncomplicated: Secondary | ICD-10-CM | POA: Diagnosis not present

## 2023-08-10 DIAGNOSIS — Z466 Encounter for fitting and adjustment of urinary device: Secondary | ICD-10-CM | POA: Diagnosis not present

## 2023-08-11 DIAGNOSIS — N3281 Overactive bladder: Secondary | ICD-10-CM | POA: Diagnosis not present

## 2023-08-12 ENCOUNTER — Encounter: Payer: Self-pay | Admitting: Physician Assistant

## 2023-08-12 ENCOUNTER — Ambulatory Visit (INDEPENDENT_AMBULATORY_CARE_PROVIDER_SITE_OTHER): Admitting: Physician Assistant

## 2023-08-12 VITALS — BP 133/78 | HR 101 | Ht 63.0 in | Wt 160.0 lb

## 2023-08-12 DIAGNOSIS — F0150 Vascular dementia without behavioral disturbance: Secondary | ICD-10-CM | POA: Diagnosis not present

## 2023-08-12 DIAGNOSIS — G309 Alzheimer's disease, unspecified: Secondary | ICD-10-CM | POA: Diagnosis not present

## 2023-08-12 DIAGNOSIS — F0280 Dementia in other diseases classified elsewhere without behavioral disturbance: Secondary | ICD-10-CM

## 2023-08-12 NOTE — Progress Notes (Signed)
 Assessment/Plan:   Mixed dementia, concern for vascular and Alzheimer's disease   Melinda Hartman is a delightful 83 y.o. RH female with a history off hypertension, hyperlipidemia, stage Ia left breast cancer on letrozole , recurrent UTIs, anxiety, depression, asthma, history of remote TIA in [place]  (2010), anemia, vitamin D  deficiency, vitamin B12 deficiency, arthritis with chronic low back pain, GERD, and a diagnosis of mixed dementia with concern for vascular and Alzheimer's disease seen today in follow up for memory loss. Patient is currently on memantine  5 mg twice daily (history of chronic leg cramps and chronic diarrhea).  She is limited on her ADLs due to chronic pain issues.  She no longer drives    Follow up in 6 months. Continue memantine  5 mg twice daily, side effects discussed Recommend psychotherapy for psychiatric distress Recommend good control of her cardiovascular risk factors Continue to control mood as per PCP     Subjective:    This patient is accompanied in the office by her husband  who supplements the history.  Previous records as well as any outside records available were reviewed prior to todays visit. Patient was last seen on 02/23/2023 with MMSE 27/30    Any changes in memory since last visit? About the same  She has difficulty with names even those of people that she knows, and recent conversations.  She does not enjoy doing any brain stimulating exercises.  She enjoys watching TV especially game shows and Westerns. repeats oneself?  Not so much  Disoriented when walking into a room? Denies    Leaving objects?  May misplace things and then cannot find them.  She just lost the shower rings and found in the sewing machine.  Wandering behavior?  denies   Any personality changes since last visit?  Denies  Any worsening depression?:  Denies.  Severe GAD and depression for at least 50 years,.  Hallucinations or paranoia?  Last Monday she thought husband was  a female (she was having symptomatic UTI)  Seizures? Denies.    Any sleep changes?  Denies vivid dreams, REM behavior or sleepwalking   Sleep apnea?   Denies.   Any hygiene concerns? Denies. Husband monitors to make sure she is safe Independent of bathing and dressing?  Endorsed  Does the patient needs help with medications?   Husband is in charge   Who is in charge of the finances?  Husband is in charge     Any changes in appetite? She is on a healthy diet. Drinks plenty water    Patient have trouble swallowing?  She has a history of GERD esophageal dilatations. Does the patient cook? No Any headaches?   denies   Any vision changes?  Denies Chronic back pain endorsed, she required major back surgeries in the past Ambulates with difficulty?  Endorsed, she has chronic gait disturbance, chronic back pain with weakness, she takes nonopioid and opioid analgesics.  She has been seen by neurosurgery in the past.  Uses a walker to prevent falls  Recent falls or head injuries? 3 weeks ago she had a mechanical fall no LOC, no head injury.  Unilateral weakness, numbness or tingling? denies   Any anosmia?  Denies   Any incontinence of urine?  She has OAB, chronic cystitis due to decreased UO, follows with urology last UTI in June 2025, placed a catheter, to be removed 8/7/205  Any bowel dysfunction?  intermittent diarrhea  patient lives with her husband [facility] 21 h a week .  Does  the patient drive? No longer drives    Neuropsych evaluation 03/30/2023, Dr. Richie: Briefly, results suggested primary impairments surrounding attention/concentration, verbal fluency (phonemic slightly worse than semantic), and encoding (i.e., learning) aspects of memory. Further performance variability was exhibited across processing speed, confrontation naming, visuospatial abilities, and both delayed retrieval and recognition/consolidation aspects of memory. The etiology for her dementia presentation remains somewhat uncertain.  Past neuroimaging suggested advanced microvascular ischemic disease and patterns of impairment across testing aligns with this finding reasonably well. Additionally, across mood-related questionnaires, Melinda Hartman reported acute symptoms of severe anxiety and severe depression. The severity of psychiatric distress could explain concern surrounding auditory hallucinations. There certainly remains the potential for a primary vascular cause for ongoing impairment (i.e., a vascular dementia presentation), exacerbated by the presence of severe psychiatric distress. Frequent headaches, chronic pain, and medication side effects (especially clonazepam /Klonopin  and tramadol/Ultram) may serve to further exacerbate deficits. Despite impairments across initial learning trials of memory testing, Melinda Hartman was able to demonstrate at least some retention across a story learning task (retention rates were 0% and 25% across figure and list-based tasks). She also benefited from cueing across list and figure tasks. This would be somewhat inconsistent with a classic Alzheimer's disease. Variable but largely adequate confrontation naming is also encouraging in this regard. Ultimately, while concurrent underlying Alzheimer's disease is plausible and cannot be ruled out, I am unable to rule it in with confidence based upon current testing. Continued medical monitoring will be important moving forward.    Initial visit 06/22/2022 How long did patient have memory difficulties?  Patient reports that for the last year she has been noticing changes in her memory.  Her husband states that these changes have been worse since sustaining a mechanical fall in February 2024, hitting her face in the left side of her head .  She did not lose consciousness at that time.  She reports not remembering people's names, even those that she knows, sometimes calling her husband daddy.  having trouble finding things around the house, or remembering a  conversation.  She continues to be as active as I can , enjoying yard work and other outdoor activities although not as often as before due to mobility issues.  She does not enjoy doing crossword puzzles, word finding.  She enjoys watching TV game shows.  repeats oneself?  Endorsed Disoriented when walking into a room?  Patient denies  Leaving objects in unusual places?   denies   Wandering behavior? denies   Any personality changes ? denies   Any history of depression?: Endorsed since 1972. She has severe GAD.  Hallucinations or paranoia? Endorsed.  Sometimes she thinks that her parents are sleeping in her house and ask where they are .  Seizures?  Patient denies, but husband reports that these may have been several decades ago, and only 1 episode, while in [place] .  No recurrence. Any sleep changes?  Sleeps fairly well.  Denies vivid dreams, REM behavior or sleepwalking   Sleep apnea? denies   Any hygiene concerns?  denies   Independent of bathing and dressing?  Endorsed  Does the patient need help with medications?  Husband is in charge for the last 4 weeks as she was forgetting to take them  Who is in charge of the finances?  Husband  is in charge    Any changes in appetite?  Not as good as before. Drinks plenty water      Patient have trouble swallowing?  Had esophageal dilatation  Does the patient cook?  Not right now. Forgets common recipes   Any kitchen accidents such as leaving the stove on? Patient denies   Any headaches? Since falling she has some headaches in the frontal area, worse in the morning. Takes tylenol  3-4/day and tramadol occasionally. Chronic back pain?  Endorsed, had major back surgery several times Ambulates with difficulty?  Endorsed, the patient has chronic gait disturbance, with pain in chronic back pain and weakness.  She tried PT at home, and she is taking nonopioid and opioid analgesics.  She has had some recent falls, stating that the legs give up on her .   Of note, per chart report she has chronic low back pain with bilateral sciatica. Been seen by neurosurgery in the past.  Recent falls or head injuries? Endorsed. Denies LOC. Legs gave out.  She has been doing physical therapy, and uses the walker. Vision changes? Denies  Unilateral weakness, numbness or tingling?  denies   Any tremors?  denies   Any anosmia?  denies   Any incontinence of urine?  She has a history of OAB, and chronic cystitis with decreased UO. Urology has followed in the past , but she was in a lot of meds, she went to [facility] and placed her on antibiotics that did not work, then saw a PA in urogynecology, was on oxybutynin  but had to discontinue it due to memory concerns, most recently tried Botox 3 weeks ago with nitrofurantoin.   Any bowel dysfunction? denies      Patient lives with her husband.    History of heavy alcohol intake? denies   History of heavy tobacco use? denies   Family history of dementia?   2 sisters  had dementia, one possibly with AD   Does patient drive? No    MRI of the brain 2024, personally reviewed, remarkable for advanced chronic small vessel ischemic disease within the cerebral white matter, as well as chronic small vessel disease and changes in the basal ganglia and pons as well as mild to moderate for age generalized cerebral atrophy, no acute findings.   PREVIOUS MEDICATIONS:   CURRENT MEDICATIONS:  Outpatient Encounter Medications as of 08/12/2023  Medication Sig   acetaminophen  (TYLENOL ) 325 MG tablet Take 325 mg by mouth every 6 (six) hours as needed for moderate pain (pain score 4-6).   albuterol  (PROVENTIL ) (2.5 MG/3ML) 0.083% nebulizer solution Take 2.5 mg by nebulization every 6 (six) hours as needed.   albuterol  (VENTOLIN  HFA) 108 (90 Base) MCG/ACT inhaler Inhale into the lungs every 6 (six) hours as needed.   aspirin EC 81 MG tablet Take 81 mg by mouth daily.    Calcium Carbonate-Vitamin D  (CALTRATE 600+D PO) Take by mouth.    celecoxib (CELEBREX) 100 MG capsule Take 100 mg by mouth 2 (two) times daily. (Patient taking differently: Take 100 mg by mouth daily.)   ciprofloxacin (CIPRO) 250 MG tablet Take 250 mg by mouth 2 (two) times daily.   clonazePAM  (KLONOPIN ) 0.5 MG tablet Take 1 mg by mouth 2 (two) times daily.    diphenoxylate -atropine  (LOMOTIL ) 2.5-0.025 MG tablet Take 2 tablets by mouth 4 (four) times daily as needed for diarrhea or loose stools.   DULoxetine  (CYMBALTA ) 30 MG capsule Take 30 mg by mouth daily.   esomeprazole (NEXIUM) 40 MG capsule Take 40 mg by mouth 2 (two) times daily.   estradiol (ESTRACE) 0.1 MG/GM vaginal cream Place 1 Applicatorful vaginally at bedtime.   ezetimibe  (ZETIA ) 10 MG tablet Take 10  mg by mouth daily.   fluticasone -salmeterol (ADVAIR) 250-50 MCG/ACT AEPB Inhale 1 puff into the lungs in the morning and at bedtime.   memantine  (NAMENDA ) 5 MG tablet Take 1 tablet (5 mg total) by mouth 2 (two) times daily.   nitroGLYCERIN  (NITROSTAT ) 0.4 MG SL tablet Place 0.4 mg under the tongue every 5 (five) minutes as needed for chest pain.    polyethylene glycol (MIRALAX / GLYCOLAX) packet Take 17 g by mouth daily as needed for mild constipation.    potassium chloride  SA (K-DUR,KLOR-CON ) 20 MEQ tablet Take 20 mEq by mouth daily.    telmisartan (MICARDIS) 40 MG tablet Take 40 mg by mouth daily.   traMADol (ULTRAM) 50 MG tablet Take 50 mg by mouth every 12 (twelve) hours as needed for moderate pain.    diclofenac (CATAFLAM) 50 MG tablet Take 50 mg by mouth 3 (three) times daily. (Patient not taking: Reported on 08/12/2023)   fosfomycin (MONUROL) 3 g PACK Take 3 g by mouth once a week. (Patient not taking: Reported on 08/12/2023)   gabapentin  (NEURONTIN ) 300 MG capsule Take 300 mg by mouth 3 (three) times daily. (Patient not taking: Reported on 08/12/2023)   No facility-administered encounter medications on file as of 08/12/2023.       02/23/2023   12:00 PM  MMSE - Mini Mental State Exam   Orientation to time 3  Orientation to Place 5  Registration 3  Attention/ Calculation 5  Recall 2  Language- name 2 objects 2  Language- repeat 1  Language- follow 3 step command 3  Language- read & follow direction 1  Write a sentence 1  Copy design 1  Total score 27      06/22/2022   11:00 AM  Montreal Cognitive Assessment   Visuospatial/ Executive (0/5) 1  Naming (0/3) 3  Attention: Read list of digits (0/2) 2  Attention: Read list of letters (0/1) 1  Attention: Serial 7 subtraction starting at 100 (0/3) 0  Language: Repeat phrase (0/2) 0  Language : Fluency (0/1) 0  Abstraction (0/2) 1  Delayed Recall (0/5) 0  Orientation (0/6) 4  Total 12  Adjusted Score (based on education) 13    Objective:     PHYSICAL EXAMINATION:    VITALS:   Vitals:   08/12/23 1233  BP: 133/78  Pulse: (!) 101  SpO2: 96%  Weight: 160 lb (72.6 kg)  Height: 5' 3 (1.6 m)    GEN:  The patient appears stated age and is in NAD. HEENT:  Normocephalic, atraumatic.   Neurological examination:  General: NAD, well-groomed, appears stated age. Orientation: The patient is alert. Oriented to person, place and not to date Cranial nerves: There is good facial symmetry.The speech is fluent and clear. No aphasia or dysarthria. Fund of knowledge is appropriate. Recent and remote memory are impaired. Attention and concentration are reduced. Able to name objects and repeat phrases.  Hearing is intact to conversational tone.   Sensation: Sensation is intact to light touch throughout Motor: Strength is at least antigravity x4. DTR's 2/4 in UE/LE     Movement examination: Tone: There is normal tone in the UE/LE Abnormal movements:  No myoclonus.  No asterixis.   Coordination:  There is no decremation with RAM's. Normal finger to nose  Gait and Station: The patient has no difficulty arising out of a deep-seated chair without the use of the hands. The patient's stride length is good.  Gait is cautious  and narrow.    Thank  you for allowing us  the opportunity to participate in the care of this nice patient. Please do not hesitate to contact us  for any questions or concerns.   Total time spent on today's visit was 32 minutes dedicated to this patient today, preparing to see patient, examining the patient, ordering tests and/or medications and counseling the patient, documenting clinical information in the EHR or other health record, independently interpreting results and communicating results to the patient/family, discussing treatment and goals, answering patient's questions and coordinating care.  Cc:  Erick Greig LABOR, NP  Camie Sevin 08/15/2023 12:16 PM

## 2023-08-12 NOTE — Patient Instructions (Signed)
 It was a pleasure to see you today at our office.   Recommendations:   Continue Memantine   5 mg twice a day.    Continue your anxiety pill  Follow up in  6  months      RECOMMENDATIONS FOR ALL PATIENTS WITH MEMORY PROBLEMS: 1. Continue to exercise (Recommend 30 minutes of walking everyday, or 3 hours every week) 2. Increase social interactions - continue going to [place] and enjoy social gatherings with friends and family 3. Eat healthy, avoid fried foods and eat more fruits and vegetables 4. Maintain adequate blood pressure, blood sugar, and blood cholesterol level. Reducing the risk of stroke and cardiovascular disease also helps promoting better memory. 5. Avoid stressful situations. Live a simple life and avoid aggravations. Organize your time and prepare for the next day in anticipation. 6. Sleep well, avoid any interruptions of sleep and avoid any distractions in the bedroom that may interfere with adequate sleep quality 7. Avoid sugar, avoid sweets as there is a strong link between excessive sugar intake, diabetes, and cognitive impairment We discussed the Mediterranean diet, which has been shown to help patients reduce the risk of progressive memory disorders and reduces cardiovascular risk. This includes eating fish, eat fruits and green leafy vegetables, nuts like almonds and hazelnuts, walnuts, and also use olive oil. Avoid fast foods and fried foods as much as possible. Avoid sweets and sugar as sugar use has been linked to worsening of memory function.  There is always a concern of gradual progression of memory problems. If this is the case, then we may need to adjust level of care according to patient needs. Support, both to the patient and caregiver, should then be put into place.        FALL PRECAUTIONS: Be cautious when walking. Scan the area for obstacles that may increase the risk of trips and falls. When getting up in the mornings, sit up at the edge of the bed for a  few minutes before getting out of bed. Consider elevating the bed at the head end to avoid drop of blood pressure when getting up. Walk always in a well-lit room (use night lights in the walls). Avoid area rugs or power cords from appliances in the middle of the walkways. Use a walker or a cane if necessary and consider physical therapy for balance exercise. Get your eyesight checked regularly.  FINANCIAL OVERSIGHT: Supervision, especially oversight when making financial decisions or transactions is also recommended.  HOME SAFETY: Consider the safety of the kitchen when operating appliances like stoves, microwave oven, and blender. Consider having supervision and share cooking responsibilities until no longer able to participate in those. Accidents with firearms and other hazards in the house should be identified and addressed as well.   ABILITY TO BE LEFT ALONE: If patient is unable to contact 911 operator, consider using LifeLine, or when the need is there, arrange for someone to stay with patients. Smoking is a fire hazard, consider supervision or cessation. Risk of wandering should be assessed by caregiver and if detected at any point, supervision and safe proof recommendations should be instituted.  MEDICATION SUPERVISION: Inability to self-administer medication needs to be constantly addressed. Implement a mechanism to ensure safe administration of the medications.   DRIVING: Regarding driving, in patients with progressive memory problems, driving will be impaired. We advise to have someone else do the driving if trouble finding directions or if minor accidents are reported. Independent driving assessment is available to determine safety of driving.  If you are interested in the driving assessment, you can contact the following:  [employer] in [place] [phone]  [facility] [phone]  [facility] [phone]  [phone] or [phone]    Mediterranean Diet A Mediterranean diet refers to food and lifestyle choices that are based on the traditions of countries located on the [employer]. This way of eating has been shown to help prevent certain conditions and improve outcomes for people who have chronic diseases, like kidney disease and heart disease. What are tips for following this plan? Lifestyle  Cook and eat meals together with your family, when possible. Drink enough fluid to keep your urine clear or pale yellow. Be physically active every day. This includes: Aerobic exercise like running or swimming. Leisure activities like gardening, walking, or housework. Get 7-8 hours of sleep each night. If recommended by your health care provider, drink red wine in moderation. This means 1 glass a day for nonpregnant women and 2 glasses a day for men. A glass of wine equals 5 oz (150 mL). Reading food labels  Check the serving size of packaged foods. For foods such as rice and pasta, the serving size refers to the amount of cooked product, not dry. Check the total fat in packaged foods. Avoid foods that have saturated fat or trans fats. Check the ingredients list for added sugars, such as corn syrup. Shopping  At the grocery store, buy most of your food from the areas near the walls of the store. This includes: Fresh fruits and vegetables (produce). Grains, beans, nuts, and seeds. Some of these may be available in unpackaged forms or large amounts (in bulk). Fresh seafood. Poultry and eggs. Low-fat dairy products. Buy whole ingredients instead of prepackaged foods. Buy fresh fruits and vegetables in-season from local farmers markets. Buy frozen fruits and vegetables in resealable bags. If you do not have access to quality fresh seafood, buy precooked frozen shrimp or canned fish, such as tuna, salmon, or sardines. Buy small amounts of raw or cooked vegetables, salads, or olives from the  deli or salad bar at your store. Stock your pantry so you always have certain foods on hand, such as olive oil, canned tuna, canned tomatoes, rice, pasta, and beans. Cooking  Cook foods with extra-virgin olive oil instead of using butter or other vegetable oils. Have meat as a side dish, and have vegetables or grains as your main dish. This means having meat in small portions or adding small amounts of meat to foods like pasta or stew. Use beans or vegetables instead of meat in common dishes like chili or lasagna. Experiment with different cooking methods. Try roasting or broiling vegetables instead of steaming or sauteing them. Add frozen vegetables to soups, stews, pasta, or rice. Add nuts or seeds for added healthy fat at each meal. You can add these to yogurt, salads, or vegetable dishes. Marinate fish or vegetables using olive oil, lemon juice, garlic, and fresh herbs. Meal planning  Plan to eat 1 vegetarian meal one day each week. Try to work up to 2 vegetarian meals, if possible. Eat seafood 2 or more times a week. Have healthy snacks readily available, such as: Vegetable sticks with hummus. Greek yogurt. Fruit and nut trail mix. Eat balanced meals throughout the week. This includes: Fruit: 2-3 servings a day Vegetables: 4-5 servings a day Low-fat dairy: 2 servings a day Fish, poultry, or lean meat: 1 serving a day Beans and legumes: 2 or more  servings a week Nuts and seeds: 1-2 servings a day Whole grains: 6-8 servings a day Extra-virgin olive oil: 3-4 servings a day Limit red meat and sweets to only a few servings a month What are my food choices? Mediterranean diet Recommended Grains: Whole-grain pasta. Brown rice. Bulgar wheat. Polenta. Couscous. Whole-wheat bread. [place] Madeira. Vegetables: Artichokes. Beets. Broccoli. Cabbage. Carrots. Eggplant. Green beans. Chard. Kale. Spinach. Onions. Leeks. Peas. Squash. Tomatoes. Peppers. Radishes. Fruits: Apples. Apricots.  Avocado. Berries. Bananas. Cherries. Dates. Figs. Grapes. Lemons. Melon. Oranges. Peaches. Plums. Pomegranate. Meats and other protein foods: Beans. Almonds. Sunflower seeds. Pine nuts. Peanuts. Cod. Salmon. Scallops. Shrimp. Tuna. Tilapia. Clams. Oysters. Eggs. Dairy: Low-fat milk. Cheese. Greek yogurt. Beverages: Water. Red wine. Herbal tea. Fats and oils: Extra virgin olive oil. Avocado oil. Grape seed oil. Sweets and desserts: [place] yogurt with honey. Baked apples. Poached pears. Trail mix. Seasoning and other foods: Basil. Cilantro. Coriander. Cumin. Mint. Parsley. Sage. Rosemary. Tarragon. Garlic. Oregano. Thyme. Pepper. Balsalmic vinegar. Tahini. Hummus. Tomato sauce. Olives. Mushrooms. Limit these Grains: Prepackaged pasta or rice dishes. Prepackaged cereal with added sugar. Vegetables: Deep fried potatoes (french fries). Fruits: Fruit canned in syrup. Meats and other protein foods: Beef. Pork. Lamb. Poultry with skin. Hot dogs. [place]. Dairy: Ice cream. Sour cream. Whole milk. Beverages: Juice. Sugar-sweetened soft drinks. Beer. Liquor and spirits. Fats and oils: Butter. Canola oil. Vegetable oil. Beef fat (tallow). Lard. Sweets and desserts: Cookies. Cakes. Pies. Candy. Seasoning and other foods: Mayonnaise. Premade sauces and marinades. The items listed may not be a complete list. Talk with your dietitian about what dietary choices are right for you. Summary The Mediterranean diet includes both food and lifestyle choices. Eat a variety of fresh fruits and vegetables, beans, nuts, seeds, and whole grains. Limit the amount of red meat and sweets that you eat. Talk with your health care provider about whether it is safe for you to drink red wine in moderation. This means 1 glass a day for nonpregnant women and 2 glasses a day for men. A glass of wine equals 5 oz (150 mL). This information is not intended to replace advice given to you by your health care provider. Make sure you discuss  any questions you have with your health care provider. Document Released: 08/28/2015 Document Revised: 09/30/2015 Document Reviewed: 08/28/2015 Elsevier Interactive Patient Education  2017 [employer].      MRI brain at [facility] [phone] Labs today [address]

## 2023-08-15 DIAGNOSIS — D649 Anemia, unspecified: Secondary | ICD-10-CM | POA: Diagnosis not present

## 2023-08-15 DIAGNOSIS — I10 Essential (primary) hypertension: Secondary | ICD-10-CM | POA: Diagnosis not present

## 2023-08-15 DIAGNOSIS — J45909 Unspecified asthma, uncomplicated: Secondary | ICD-10-CM | POA: Diagnosis not present

## 2023-08-15 DIAGNOSIS — S81011D Laceration without foreign body, right knee, subsequent encounter: Secondary | ICD-10-CM | POA: Diagnosis not present

## 2023-08-15 DIAGNOSIS — Z466 Encounter for fitting and adjustment of urinary device: Secondary | ICD-10-CM | POA: Diagnosis not present

## 2023-08-15 DIAGNOSIS — S51011D Laceration without foreign body of right elbow, subsequent encounter: Secondary | ICD-10-CM | POA: Diagnosis not present

## 2023-08-18 ENCOUNTER — Other Ambulatory Visit: Payer: Self-pay | Admitting: Hematology and Oncology

## 2023-08-18 DIAGNOSIS — Z8673 Personal history of transient ischemic attack (TIA), and cerebral infarction without residual deficits: Secondary | ICD-10-CM | POA: Diagnosis not present

## 2023-08-18 DIAGNOSIS — I10 Essential (primary) hypertension: Secondary | ICD-10-CM | POA: Diagnosis not present

## 2023-08-18 DIAGNOSIS — M810 Age-related osteoporosis without current pathological fracture: Secondary | ICD-10-CM | POA: Diagnosis not present

## 2023-08-18 DIAGNOSIS — C50212 Malignant neoplasm of upper-inner quadrant of left female breast: Secondary | ICD-10-CM

## 2023-08-18 DIAGNOSIS — D649 Anemia, unspecified: Secondary | ICD-10-CM | POA: Diagnosis not present

## 2023-08-18 DIAGNOSIS — F32A Depression, unspecified: Secondary | ICD-10-CM | POA: Diagnosis not present

## 2023-08-18 DIAGNOSIS — G4700 Insomnia, unspecified: Secondary | ICD-10-CM | POA: Diagnosis not present

## 2023-08-18 DIAGNOSIS — Z853 Personal history of malignant neoplasm of breast: Secondary | ICD-10-CM | POA: Diagnosis not present

## 2023-08-18 DIAGNOSIS — Z791 Long term (current) use of non-steroidal anti-inflammatories (NSAID): Secondary | ICD-10-CM | POA: Diagnosis not present

## 2023-08-18 DIAGNOSIS — Z466 Encounter for fitting and adjustment of urinary device: Secondary | ICD-10-CM | POA: Diagnosis not present

## 2023-08-18 DIAGNOSIS — F419 Anxiety disorder, unspecified: Secondary | ICD-10-CM | POA: Diagnosis not present

## 2023-08-18 DIAGNOSIS — S51011D Laceration without foreign body of right elbow, subsequent encounter: Secondary | ICD-10-CM | POA: Diagnosis not present

## 2023-08-18 DIAGNOSIS — Z9181 History of falling: Secondary | ICD-10-CM | POA: Diagnosis not present

## 2023-08-18 DIAGNOSIS — E559 Vitamin D deficiency, unspecified: Secondary | ICD-10-CM | POA: Diagnosis not present

## 2023-08-18 DIAGNOSIS — Z7951 Long term (current) use of inhaled steroids: Secondary | ICD-10-CM | POA: Diagnosis not present

## 2023-08-18 DIAGNOSIS — J45909 Unspecified asthma, uncomplicated: Secondary | ICD-10-CM | POA: Diagnosis not present

## 2023-08-18 DIAGNOSIS — N3281 Overactive bladder: Secondary | ICD-10-CM | POA: Diagnosis not present

## 2023-08-18 DIAGNOSIS — H903 Sensorineural hearing loss, bilateral: Secondary | ICD-10-CM | POA: Diagnosis not present

## 2023-08-18 DIAGNOSIS — S81011D Laceration without foreign body, right knee, subsequent encounter: Secondary | ICD-10-CM | POA: Diagnosis not present

## 2023-08-18 DIAGNOSIS — E785 Hyperlipidemia, unspecified: Secondary | ICD-10-CM | POA: Diagnosis not present

## 2023-08-18 DIAGNOSIS — M1990 Unspecified osteoarthritis, unspecified site: Secondary | ICD-10-CM | POA: Diagnosis not present

## 2023-08-18 DIAGNOSIS — Z87442 Personal history of urinary calculi: Secondary | ICD-10-CM | POA: Diagnosis not present

## 2023-08-19 DIAGNOSIS — Z6829 Body mass index (BMI) 29.0-29.9, adult: Secondary | ICD-10-CM | POA: Diagnosis not present

## 2023-08-19 DIAGNOSIS — R339 Retention of urine, unspecified: Secondary | ICD-10-CM | POA: Diagnosis not present

## 2023-08-19 DIAGNOSIS — N390 Urinary tract infection, site not specified: Secondary | ICD-10-CM | POA: Diagnosis not present

## 2023-08-19 DIAGNOSIS — E538 Deficiency of other specified B group vitamins: Secondary | ICD-10-CM | POA: Diagnosis not present

## 2023-08-19 DIAGNOSIS — F03A3 Unspecified dementia, mild, with mood disturbance: Secondary | ICD-10-CM | POA: Diagnosis not present

## 2023-08-19 DIAGNOSIS — F321 Major depressive disorder, single episode, moderate: Secondary | ICD-10-CM | POA: Diagnosis not present

## 2023-08-23 ENCOUNTER — Ambulatory Visit: Payer: Medicare Other | Admitting: Physician Assistant

## 2023-08-26 DIAGNOSIS — I10 Essential (primary) hypertension: Secondary | ICD-10-CM | POA: Diagnosis not present

## 2023-08-26 DIAGNOSIS — S51011D Laceration without foreign body of right elbow, subsequent encounter: Secondary | ICD-10-CM | POA: Diagnosis not present

## 2023-08-26 DIAGNOSIS — S81011D Laceration without foreign body, right knee, subsequent encounter: Secondary | ICD-10-CM | POA: Diagnosis not present

## 2023-08-26 DIAGNOSIS — Z466 Encounter for fitting and adjustment of urinary device: Secondary | ICD-10-CM | POA: Diagnosis not present

## 2023-08-26 DIAGNOSIS — J45909 Unspecified asthma, uncomplicated: Secondary | ICD-10-CM | POA: Diagnosis not present

## 2023-08-26 DIAGNOSIS — D649 Anemia, unspecified: Secondary | ICD-10-CM | POA: Diagnosis not present

## 2023-08-29 DIAGNOSIS — R339 Retention of urine, unspecified: Secondary | ICD-10-CM | POA: Diagnosis not present

## 2023-08-31 DIAGNOSIS — D649 Anemia, unspecified: Secondary | ICD-10-CM | POA: Diagnosis not present

## 2023-08-31 DIAGNOSIS — S81011D Laceration without foreign body, right knee, subsequent encounter: Secondary | ICD-10-CM | POA: Diagnosis not present

## 2023-08-31 DIAGNOSIS — J45909 Unspecified asthma, uncomplicated: Secondary | ICD-10-CM | POA: Diagnosis not present

## 2023-08-31 DIAGNOSIS — S51011D Laceration without foreign body of right elbow, subsequent encounter: Secondary | ICD-10-CM | POA: Diagnosis not present

## 2023-08-31 DIAGNOSIS — Z466 Encounter for fitting and adjustment of urinary device: Secondary | ICD-10-CM | POA: Diagnosis not present

## 2023-08-31 DIAGNOSIS — R300 Dysuria: Secondary | ICD-10-CM | POA: Diagnosis not present

## 2023-08-31 DIAGNOSIS — I10 Essential (primary) hypertension: Secondary | ICD-10-CM | POA: Diagnosis not present

## 2023-09-06 DIAGNOSIS — Z466 Encounter for fitting and adjustment of urinary device: Secondary | ICD-10-CM | POA: Diagnosis not present

## 2023-09-06 DIAGNOSIS — S81011D Laceration without foreign body, right knee, subsequent encounter: Secondary | ICD-10-CM | POA: Diagnosis not present

## 2023-09-06 DIAGNOSIS — J45909 Unspecified asthma, uncomplicated: Secondary | ICD-10-CM | POA: Diagnosis not present

## 2023-09-06 DIAGNOSIS — D649 Anemia, unspecified: Secondary | ICD-10-CM | POA: Diagnosis not present

## 2023-09-06 DIAGNOSIS — I10 Essential (primary) hypertension: Secondary | ICD-10-CM | POA: Diagnosis not present

## 2023-09-06 DIAGNOSIS — S51011D Laceration without foreign body of right elbow, subsequent encounter: Secondary | ICD-10-CM | POA: Diagnosis not present

## 2023-09-13 DIAGNOSIS — I10 Essential (primary) hypertension: Secondary | ICD-10-CM | POA: Diagnosis not present

## 2023-09-13 DIAGNOSIS — F03A3 Unspecified dementia, mild, with mood disturbance: Secondary | ICD-10-CM | POA: Diagnosis not present

## 2023-09-13 DIAGNOSIS — R300 Dysuria: Secondary | ICD-10-CM | POA: Diagnosis not present

## 2023-09-13 DIAGNOSIS — F419 Anxiety disorder, unspecified: Secondary | ICD-10-CM | POA: Diagnosis not present

## 2023-09-14 DIAGNOSIS — S51011D Laceration without foreign body of right elbow, subsequent encounter: Secondary | ICD-10-CM | POA: Diagnosis not present

## 2023-09-14 DIAGNOSIS — J45909 Unspecified asthma, uncomplicated: Secondary | ICD-10-CM | POA: Diagnosis not present

## 2023-09-14 DIAGNOSIS — S81011D Laceration without foreign body, right knee, subsequent encounter: Secondary | ICD-10-CM | POA: Diagnosis not present

## 2023-09-14 DIAGNOSIS — Z466 Encounter for fitting and adjustment of urinary device: Secondary | ICD-10-CM | POA: Diagnosis not present

## 2023-09-14 DIAGNOSIS — I10 Essential (primary) hypertension: Secondary | ICD-10-CM | POA: Diagnosis not present

## 2023-09-14 DIAGNOSIS — D649 Anemia, unspecified: Secondary | ICD-10-CM | POA: Diagnosis not present

## 2023-09-17 DIAGNOSIS — M1990 Unspecified osteoarthritis, unspecified site: Secondary | ICD-10-CM | POA: Diagnosis not present

## 2023-09-17 DIAGNOSIS — G8929 Other chronic pain: Secondary | ICD-10-CM | POA: Diagnosis not present

## 2023-09-17 DIAGNOSIS — Z9181 History of falling: Secondary | ICD-10-CM | POA: Diagnosis not present

## 2023-09-17 DIAGNOSIS — J4530 Mild persistent asthma, uncomplicated: Secondary | ICD-10-CM | POA: Diagnosis not present

## 2023-09-17 DIAGNOSIS — N3281 Overactive bladder: Secondary | ICD-10-CM | POA: Diagnosis not present

## 2023-09-17 DIAGNOSIS — I10 Essential (primary) hypertension: Secondary | ICD-10-CM | POA: Diagnosis not present

## 2023-09-17 DIAGNOSIS — Z7951 Long term (current) use of inhaled steroids: Secondary | ICD-10-CM | POA: Diagnosis not present

## 2023-09-17 DIAGNOSIS — E559 Vitamin D deficiency, unspecified: Secondary | ICD-10-CM | POA: Diagnosis not present

## 2023-09-17 DIAGNOSIS — M810 Age-related osteoporosis without current pathological fracture: Secondary | ICD-10-CM | POA: Diagnosis not present

## 2023-09-17 DIAGNOSIS — G4700 Insomnia, unspecified: Secondary | ICD-10-CM | POA: Diagnosis not present

## 2023-09-17 DIAGNOSIS — M5442 Lumbago with sciatica, left side: Secondary | ICD-10-CM | POA: Diagnosis not present

## 2023-09-17 DIAGNOSIS — E785 Hyperlipidemia, unspecified: Secondary | ICD-10-CM | POA: Diagnosis not present

## 2023-09-17 DIAGNOSIS — Z87442 Personal history of urinary calculi: Secondary | ICD-10-CM | POA: Diagnosis not present

## 2023-09-17 DIAGNOSIS — Z791 Long term (current) use of non-steroidal anti-inflammatories (NSAID): Secondary | ICD-10-CM | POA: Diagnosis not present

## 2023-09-17 DIAGNOSIS — F321 Major depressive disorder, single episode, moderate: Secondary | ICD-10-CM | POA: Diagnosis not present

## 2023-09-17 DIAGNOSIS — D539 Nutritional anemia, unspecified: Secondary | ICD-10-CM | POA: Diagnosis not present

## 2023-09-17 DIAGNOSIS — F419 Anxiety disorder, unspecified: Secondary | ICD-10-CM | POA: Diagnosis not present

## 2023-09-17 DIAGNOSIS — Z853 Personal history of malignant neoplasm of breast: Secondary | ICD-10-CM | POA: Diagnosis not present

## 2023-09-17 DIAGNOSIS — H903 Sensorineural hearing loss, bilateral: Secondary | ICD-10-CM | POA: Diagnosis not present

## 2023-09-17 DIAGNOSIS — N390 Urinary tract infection, site not specified: Secondary | ICD-10-CM | POA: Diagnosis not present

## 2023-09-17 DIAGNOSIS — M5441 Lumbago with sciatica, right side: Secondary | ICD-10-CM | POA: Diagnosis not present

## 2023-09-17 DIAGNOSIS — Z8673 Personal history of transient ischemic attack (TIA), and cerebral infarction without residual deficits: Secondary | ICD-10-CM | POA: Diagnosis not present

## 2023-09-17 DIAGNOSIS — F03A3 Unspecified dementia, mild, with mood disturbance: Secondary | ICD-10-CM | POA: Diagnosis not present

## 2023-09-20 DIAGNOSIS — D539 Nutritional anemia, unspecified: Secondary | ICD-10-CM | POA: Diagnosis not present

## 2023-09-20 DIAGNOSIS — J4530 Mild persistent asthma, uncomplicated: Secondary | ICD-10-CM | POA: Diagnosis not present

## 2023-09-20 DIAGNOSIS — N390 Urinary tract infection, site not specified: Secondary | ICD-10-CM | POA: Diagnosis not present

## 2023-09-20 DIAGNOSIS — G4700 Insomnia, unspecified: Secondary | ICD-10-CM | POA: Diagnosis not present

## 2023-09-20 DIAGNOSIS — I10 Essential (primary) hypertension: Secondary | ICD-10-CM | POA: Diagnosis not present

## 2023-09-20 DIAGNOSIS — F03A3 Unspecified dementia, mild, with mood disturbance: Secondary | ICD-10-CM | POA: Diagnosis not present

## 2023-09-28 DIAGNOSIS — G4700 Insomnia, unspecified: Secondary | ICD-10-CM | POA: Diagnosis not present

## 2023-09-28 DIAGNOSIS — F03A3 Unspecified dementia, mild, with mood disturbance: Secondary | ICD-10-CM | POA: Diagnosis not present

## 2023-09-28 DIAGNOSIS — I10 Essential (primary) hypertension: Secondary | ICD-10-CM | POA: Diagnosis not present

## 2023-09-28 DIAGNOSIS — J4530 Mild persistent asthma, uncomplicated: Secondary | ICD-10-CM | POA: Diagnosis not present

## 2023-09-28 DIAGNOSIS — D539 Nutritional anemia, unspecified: Secondary | ICD-10-CM | POA: Diagnosis not present

## 2023-09-28 DIAGNOSIS — N390 Urinary tract infection, site not specified: Secondary | ICD-10-CM | POA: Diagnosis not present

## 2023-10-04 DIAGNOSIS — G4700 Insomnia, unspecified: Secondary | ICD-10-CM | POA: Diagnosis not present

## 2023-10-04 DIAGNOSIS — I10 Essential (primary) hypertension: Secondary | ICD-10-CM | POA: Diagnosis not present

## 2023-10-04 DIAGNOSIS — N390 Urinary tract infection, site not specified: Secondary | ICD-10-CM | POA: Diagnosis not present

## 2023-10-04 DIAGNOSIS — J4530 Mild persistent asthma, uncomplicated: Secondary | ICD-10-CM | POA: Diagnosis not present

## 2023-10-04 DIAGNOSIS — D539 Nutritional anemia, unspecified: Secondary | ICD-10-CM | POA: Diagnosis not present

## 2023-10-04 DIAGNOSIS — F03A3 Unspecified dementia, mild, with mood disturbance: Secondary | ICD-10-CM | POA: Diagnosis not present

## 2023-10-06 ENCOUNTER — Ambulatory Visit (INDEPENDENT_AMBULATORY_CARE_PROVIDER_SITE_OTHER): Admitting: Physician Assistant

## 2023-10-06 ENCOUNTER — Encounter: Payer: Self-pay | Admitting: Physician Assistant

## 2023-10-06 VITALS — BP 109/80 | HR 100 | Resp 20 | Ht 63.0 in | Wt 160.0 lb

## 2023-10-06 DIAGNOSIS — F0150 Vascular dementia without behavioral disturbance: Secondary | ICD-10-CM | POA: Diagnosis not present

## 2023-10-06 DIAGNOSIS — F0280 Dementia in other diseases classified elsewhere without behavioral disturbance: Secondary | ICD-10-CM | POA: Diagnosis not present

## 2023-10-06 DIAGNOSIS — G309 Alzheimer's disease, unspecified: Secondary | ICD-10-CM

## 2023-10-06 NOTE — Progress Notes (Signed)
 Assessment/Plan:   Mixed dementia, concern for vascular and Alzheimer's disease   Melinda Hartman is a delightful 83 y.o. RH female with a history of hypertension, hyperlipidemia, stage Ia left breast cancer on letrozole , recurrent UTIs, anxiety, depression, asthma, history of remote TIA in [place]  (2010), anemia, vitamin D  deficiency, vitamin B12 deficiency, arthritis with chronic low back pain, GERD, and a diagnosis of mixed dementia with concern for vascular and Alzheimer's disease seen today in follow up for memory loss. Patient is currently on memantine  5 mg twice daily. Memory is stable, MMSE today 28/30.  Patient is able to participate on most ADLS. Mood is good.      Follow up in  6 months. Continue memantine  5 mg twice daily, side effects discussed  Recommend good control of her cardiovascular risk factors Continue to control mood as per PCP     Subjective:    This patient is accompanied in the office by her husband  who supplements the history.  Previous records as well as any outside records available were reviewed prior to todays visit. Patient was last seen on 08/12/2023, last MMSE 27/30 on 02/23/2023    Any changes in memory since last visit?  About the same, maybe better-husband says. She is able to recall more according to him, for example recent conversations and names of people.  She does not enjoy any brain stimulating exercises.  She enjoys watching TV especially game shows such as [employer] and [place], spending time with her husband. She enjoys going out with him.  repeats oneself?  Endorsed not so much . Disoriented when walking into a room? Denies    Leaving objects?  May misplace things but not in unusual places   Wandering behavior?  denies   Any personality changes since last visit?  Denies.   Any worsening depression?:  She has a long history of depression and GAD for at least 50 years, but this is well controlled according to  her.She reports feeling happy with her husband.  Hallucinations or paranoia?  Denies.  Seizures? denies    Any sleep changes?  Sleeps well. Denies vivid dreams, REM behavior or sleepwalking   Sleep apnea?   Denies.   Any hygiene concerns? Denies.  Husband monitors to make sure that she is safe Independent of bathing and dressing?  Endorsed  Does the patient needs help with medications?  Husband in charge   Who is in charge of the finances?  Husband  is in charge     Any changes in appetite?  denies.  She is on a healthy diet, drinks plenty water.    Patient have trouble swallowing?  She has a history of GERD status post esophageal dilatations. Does the patient cook? No Any headaches?   denies   Any vision changes? denies  Chronic back pain?  Endorsed, she has chronic gait disturbance, chronic back pain with weakness, takes non opioid and opioid analgesics.  She has been seen by neurosurgery in the past.  Uses a walker to prevent falls. Ambulates with difficulty? She manages well with the walker   Recent falls or head injuries? Since last visit she had  2 mechanical fall, as she got tangled at the water hose, no LOC. 2weeks ago she pulled the freezer door which is hard  and fell backwards-husband says.    Unilateral weakness, numbness or tingling? denies   Any tremors?  Denies    Any anosmia?  Denies   Any incontinence  of urine?  Endorsed, has OAB, chronic cystitis due to decreased UO, follows urology. She received 3 doses of Botox with good results.  She has recurrent UTIs.   Any bowel dysfunction?  Intermittent diarrhea    Patient lives with her husband  and has HHN 21 hours a week. Planning to move to [facility] living. She  enjoys spending time with him.   Does the patient drive? No longer drives    Neuropsych evaluation 03/30/2023, Dr. Richie: Briefly, results suggested primary impairments surrounding attention/concentration, verbal fluency (phonemic slightly worse than  semantic), and encoding (i.e., learning) aspects of memory. Further performance variability was exhibited across processing speed, confrontation naming, visuospatial abilities, and both delayed retrieval and recognition/consolidation aspects of memory. The etiology for her dementia presentation remains somewhat uncertain. Past neuroimaging suggested advanced microvascular ischemic disease and patterns of impairment across testing aligns with this finding reasonably well. Additionally, across mood-related questionnaires, Melinda Hartman reported acute symptoms of severe anxiety and severe depression. The severity of psychiatric distress could explain concern surrounding auditory hallucinations. There certainly remains the potential for a primary vascular cause for ongoing impairment (i.e., a vascular dementia presentation), exacerbated by the presence of severe psychiatric distress. Frequent headaches, chronic pain, and medication side effects (especially clonazepam /Klonopin  and tramadol/Ultram) may serve to further exacerbate deficits. Despite impairments across initial learning trials of memory testing, Melinda Hartman was able to demonstrate at least some retention across a story learning task (retention rates were 0% and 25% across figure and list-based tasks). She also benefited from cueing across list and figure tasks. This would be somewhat inconsistent with a classic Alzheimer's disease. Variable but largely adequate confrontation naming is also encouraging in this regard. Ultimately, while concurrent underlying Alzheimer's disease is plausible and cannot be ruled out, I am unable to rule it in with confidence based upon current testing. Continued medical monitoring will be important moving forward.     Initial visit 06/22/2022 How long did patient have memory difficulties?  Patient reports that for the last year she has been noticing changes in her memory.  Her husband states that these changes have been worse  since sustaining a mechanical fall in February 2024, hitting her face in the left side of her head .  She did not lose consciousness at that time.  She reports not remembering people's names, even those that she knows, sometimes calling her husband daddy.  having trouble finding things around the house, or remembering a conversation.  She continues to be as active as I can , enjoying yard work and other outdoor activities although not as often as before due to mobility issues.  She does not enjoy doing crossword puzzles, word finding.  She enjoys watching TV game shows.  repeats oneself?  Endorsed Disoriented when walking into a room?  Patient denies  Leaving objects in unusual places?   denies   Wandering behavior? denies   Any personality changes ? denies   Any history of depression?: Endorsed since 1972. She has severe GAD.  Hallucinations or paranoia? Endorsed.  Sometimes she thinks that her parents are sleeping in her house and ask where they are .  Seizures?  Patient denies, but husband reports that these may have been several decades ago, and only 1 episode, while in [place] .  No recurrence. Any sleep changes?  Sleeps fairly well.  Denies vivid dreams, REM behavior or sleepwalking   Sleep apnea? denies   Any hygiene concerns?  denies   Independent of bathing and dressing?  Endorsed  Does the patient need help with medications?  Husband is in charge for the last 4 weeks as she was forgetting to take them  Who is in charge of the finances?  Husband  is in charge    Any changes in appetite?  Not as good as before. Drinks plenty water      Patient have trouble swallowing?  Had esophageal dilatation  Does the patient cook?  Not right now. Forgets common recipes   Any kitchen accidents such as leaving the stove on? Patient denies   Any headaches? Since falling she has some headaches in the frontal area, worse in the morning. Takes tylenol  3-4/day and tramadol occasionally. Chronic back  pain?  Endorsed, had major back surgery several times Ambulates with difficulty?  Endorsed, the patient has chronic gait disturbance, with pain in chronic back pain and weakness.  She tried PT at home, and she is taking nonopioid and opioid analgesics.  She has had some recent falls, stating that the legs give up on her .  Of note, per chart report she has chronic low back pain with bilateral sciatica. Been seen by neurosurgery in the past.  Recent falls or head injuries? Endorsed. Denies LOC. Legs gave out.  She has been doing physical therapy, and uses the walker. Vision changes? Denies  Unilateral weakness, numbness or tingling?  denies   Any tremors?  denies   Any anosmia?  denies   Any incontinence of urine?  She has a history of OAB, and chronic cystitis with decreased UO. Urology has followed in the past , but she was in a lot of meds, she went to [facility] and placed her on antibiotics that did not work, then saw a PA in urogynecology, was on oxybutynin  but had to discontinue it due to memory concerns, most recently tried Botox 3 weeks ago with nitrofurantoin.   Any bowel dysfunction? denies      Patient lives with her husband.    History of heavy alcohol intake? denies   History of heavy tobacco use? denies   Family history of dementia?   2 sisters  had dementia, one possibly with AD   Does patient drive? No    MRI of the brain 2024, personally reviewed, remarkable for advanced chronic small vessel ischemic disease within the cerebral white matter, as well as chronic small vessel disease and changes in the basal ganglia and pons as well as mild to moderate for age generalized cerebral atrophy, no acute findings.   PREVIOUS MEDICATIONS:   CURRENT MEDICATIONS:  Outpatient Encounter Medications as of 10/06/2023  Medication Sig   acetaminophen  (TYLENOL ) 325 MG tablet Take 325 mg by mouth every 6 (six) hours as needed for moderate pain (pain score 4-6).   albuterol  (PROVENTIL ) (2.5 MG/3ML)  0.083% nebulizer solution Take 2.5 mg by nebulization every 6 (six) hours as needed.   albuterol  (VENTOLIN  HFA) 108 (90 Base) MCG/ACT inhaler Inhale into the lungs every 6 (six) hours as needed.   aspirin EC 81 MG tablet Take 81 mg by mouth daily.    Calcium Carbonate-Vitamin D  (CALTRATE 600+D PO) Take by mouth.   celecoxib (CELEBREX) 100 MG capsule Take 100 mg by mouth 2 (two) times daily. (Patient taking differently: Take 100 mg by mouth daily.)   ciprofloxacin (CIPRO) 250 MG tablet Take 250 mg by mouth 2 (two) times daily.   clonazePAM  (KLONOPIN ) 0.5 MG tablet Take 1 mg by mouth 2 (two) times daily.    diclofenac (CATAFLAM) 50  MG tablet Take 50 mg by mouth 3 (three) times daily. (Patient not taking: Reported on 08/12/2023)   diphenoxylate -atropine  (LOMOTIL ) 2.5-0.025 MG tablet Take 2 tablets by mouth 4 (four) times daily as needed for diarrhea or loose stools.   DULoxetine  (CYMBALTA ) 30 MG capsule Take 30 mg by mouth daily.   esomeprazole (NEXIUM) 40 MG capsule Take 40 mg by mouth 2 (two) times daily.   estradiol (ESTRACE) 0.1 MG/GM vaginal cream Place 1 Applicatorful vaginally at bedtime.   ezetimibe  (ZETIA ) 10 MG tablet Take 10 mg by mouth daily.   fluticasone -salmeterol (ADVAIR) 250-50 MCG/ACT AEPB Inhale 1 puff into the lungs in the morning and at bedtime.   fosfomycin (MONUROL) 3 g PACK Take 3 g by mouth once a week. (Patient not taking: Reported on 08/12/2023)   gabapentin  (NEURONTIN ) 300 MG capsule Take 300 mg by mouth 3 (three) times daily. (Patient not taking: Reported on 08/12/2023)   letrozole  (FEMARA ) 2.5 MG tablet TAKE 1 TABLET DAILY   memantine  (NAMENDA ) 5 MG tablet Take 1 tablet (5 mg total) by mouth 2 (two) times daily.   nitroGLYCERIN  (NITROSTAT ) 0.4 MG SL tablet Place 0.4 mg under the tongue every 5 (five) minutes as needed for chest pain.    polyethylene glycol (MIRALAX / GLYCOLAX) packet Take 17 g by mouth daily as needed for mild constipation.    potassium chloride  SA  (K-DUR,KLOR-CON ) 20 MEQ tablet Take 20 mEq by mouth daily.    telmisartan (MICARDIS) 40 MG tablet Take 40 mg by mouth daily.   traMADol (ULTRAM) 50 MG tablet Take 50 mg by mouth every 12 (twelve) hours as needed for moderate pain.    No facility-administered encounter medications on file as of 10/06/2023.       10/08/2023    4:00 PM 02/23/2023   12:00 PM  MMSE - Mini Mental State Exam  Orientation to time 3 3  Orientation to Place 5 5  Registration 3 3  Attention/ Calculation 5 5  Recall 3 2  Language- name 2 objects 2 2  Language- repeat 1 1  Language- follow 3 step command 3 3  Language- read & follow direction 1 1  Write a sentence 1 1  Copy design 1 1  Total score 28 27      06/22/2022   11:00 AM  Montreal Cognitive Assessment   Visuospatial/ Executive (0/5) 1  Naming (0/3) 3  Attention: Read list of digits (0/2) 2  Attention: Read list of letters (0/1) 1  Attention: Serial 7 subtraction starting at 100 (0/3) 0  Language: Repeat phrase (0/2) 0  Language : Fluency (0/1) 0  Abstraction (0/2) 1  Delayed Recall (0/5) 0  Orientation (0/6) 4  Total 12  Adjusted Score (based on education) 13    Objective:     PHYSICAL EXAMINATION:    VITALS:   Vitals:   10/06/23 1406  BP: 109/80  Pulse: 100  Resp: 20  SpO2: 98%  Weight: 160 lb (72.6 kg)  Height: 5' 3 (1.6 m)    GEN:  The patient appears stated age and is in NAD. HEENT:  Normocephalic, atraumatic.   Neurological examination:  General: NAD, well-groomed, appears stated age. Orientation: The patient is alert. Oriented to person, place and not to date Cranial nerves: There is good facial symmetry.The speech is fluent and clear. No aphasia or dysarthria. Fund of knowledge is appropriate. Recent memory impaired, remote memory is fair.  Attention and concentration are fair. Able to name objects and repeat  phrases.  Hearing is intact to conversational tone.   Sensation: Sensation is intact to light touch  throughout Motor: Strength is at least antigravity x4. DTR's 1/4 in UE/LE     Movement examination: Tone: There is normal tone in the UE/LE Abnormal movements:  no tremor.  No myoclonus.  No asterixis.   Coordination:  There is no decremation with RAM's. Normal finger to nose  Gait and Station: The patient has  some difficulty arising out of a deep-seated chair without the use of the hands. Needs a walker The patient's stride length is slow. Gait is cautious and narrow.    Thank you for allowing us  the opportunity to participate in the care of this nice patient. Please do not hesitate to contact us  for any questions or concerns.   Total time spent on today's visit was 28 minutes dedicated to this patient today, preparing to see patient, examining the patient, ordering tests and/or medications and counseling the patient, documenting clinical information in the EHR or other health record, independently interpreting results and communicating results to the patient/family, discussing treatment and goals, answering patient's questions and coordinating care.  Cc:  Erick Greig LABOR, NP  Camie Sevin 10/08/2023 8:31 PM

## 2023-10-06 NOTE — Patient Instructions (Signed)
 Follow up in 6 months  Continue memantine  5 mg twice a day

## 2023-10-07 DIAGNOSIS — F03A3 Unspecified dementia, mild, with mood disturbance: Secondary | ICD-10-CM | POA: Diagnosis not present

## 2023-10-07 DIAGNOSIS — Z87440 Personal history of urinary (tract) infections: Secondary | ICD-10-CM | POA: Diagnosis not present

## 2023-10-07 DIAGNOSIS — S50911A Unspecified superficial injury of right forearm, initial encounter: Secondary | ICD-10-CM | POA: Diagnosis not present

## 2023-10-07 DIAGNOSIS — I10 Essential (primary) hypertension: Secondary | ICD-10-CM | POA: Diagnosis not present

## 2023-10-07 DIAGNOSIS — R399 Unspecified symptoms and signs involving the genitourinary system: Secondary | ICD-10-CM | POA: Diagnosis not present

## 2023-10-10 DIAGNOSIS — G4700 Insomnia, unspecified: Secondary | ICD-10-CM | POA: Diagnosis not present

## 2023-10-10 DIAGNOSIS — I10 Essential (primary) hypertension: Secondary | ICD-10-CM | POA: Diagnosis not present

## 2023-10-10 DIAGNOSIS — D539 Nutritional anemia, unspecified: Secondary | ICD-10-CM | POA: Diagnosis not present

## 2023-10-10 DIAGNOSIS — J4530 Mild persistent asthma, uncomplicated: Secondary | ICD-10-CM | POA: Diagnosis not present

## 2023-10-10 DIAGNOSIS — N390 Urinary tract infection, site not specified: Secondary | ICD-10-CM | POA: Diagnosis not present

## 2023-10-10 DIAGNOSIS — F03A3 Unspecified dementia, mild, with mood disturbance: Secondary | ICD-10-CM | POA: Diagnosis not present

## 2023-10-13 ENCOUNTER — Other Ambulatory Visit: Payer: Self-pay

## 2023-10-16 NOTE — Progress Notes (Unsigned)
 [facility]  [address] [place],  [place]  [address] [phone]  Clinic Day:  06/14/2023  Referring physician: Erick Greig LABOR, NP  HISTORY OF PRESENT ILLNESS:  The patient is an 83 y.o. 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  She is currently taking letrozole  for her adjuvant endocrine therapy.  She comes in today for routine follow-up.  Since her last visit, the patient has been doing well.  She denies noticing any masses or other findings which concern her for early disease recurrence.  Of note, her annual mammogram in July 2025 showed no evidence of disease recurrence.  PHYSICAL EXAM:  There were no vitals taken for this visit. Wt Readings from Last 3 Encounters:  10/06/23 160 lb (72.6 kg)  08/12/23 160 lb (72.6 kg)  06/14/23 165 lb 9.6 oz (75.1 kg)   There is no height or weight on file to calculate BMI. Performance status (ECOG): 1 - Symptomatic but completely ambulatory Physical Exam Constitutional:      Appearance: Normal appearance.  HENT:     Mouth/Throat:     Pharynx: Oropharynx is clear. No oropharyngeal exudate.  Cardiovascular:     Rate and Rhythm: Normal rate and regular rhythm.     Heart sounds: No murmur heard.    No friction rub. No gallop.  Pulmonary:     Breath sounds: Normal breath sounds.  Chest:  Breasts:    Right: No swelling, bleeding, inverted nipple, mass, nipple discharge or skin change.     Left: No swelling, bleeding, inverted nipple, mass, nipple discharge or skin change.  Abdominal:     General: Bowel sounds are normal. There is no distension.     Palpations: Abdomen is soft. There is no mass.     Tenderness: There is no abdominal tenderness.  Musculoskeletal:        General: No tenderness.     Cervical back: Normal range of motion and neck supple.     Right lower leg: No edema.     Left lower leg: No edema.  Lymphadenopathy:      Cervical: No cervical adenopathy.     Right cervical: No superficial, deep or posterior cervical adenopathy.    Left cervical: No superficial, deep or posterior cervical adenopathy.     Upper Body:     Right upper body: No supraclavicular or axillary adenopathy.     Left upper body: No supraclavicular or axillary adenopathy.     Lower Body: No right inguinal adenopathy. No left inguinal adenopathy.  Skin:    Coloration: Skin is not jaundiced.     Findings: No lesion or rash.  Neurological:     General: No focal deficit present.     Mental Status: She is alert and oriented to person, place, and time. Mental status is at baseline.  Psychiatric:        Mood and Affect: Mood normal.        Behavior: Behavior normal.        Thought Content: Thought content normal.        Judgment: Judgment normal.    ASSESSMENT & PLAN:  An 83 y.o. 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  Based upon her clinical breast exam today, the patient remains disease-free.  She knows to continue taking her letrozole  on a daily basis to  complete 5 years of adjuvant endocrine therapy.  I will see her back in 4 months for her next clinical breast exam.  Her annual mammogram will be scheduled before her next visit for her continued radiographic breast cancer surveillance.  If her next mammogram and clinical breast exam both come back normal, I will begin spacing all future visits out to every 6 months.  The patient understands all the plans discussed today and is in agreement with them.  Noha Karasik DELENA Kerns, MD

## 2023-10-17 ENCOUNTER — Inpatient Hospital Stay: Attending: Oncology | Admitting: Oncology

## 2023-10-17 VITALS — BP 137/88 | HR 104 | Temp 97.9°F | Resp 16 | Ht 63.0 in | Wt 160.6 lb

## 2023-10-17 DIAGNOSIS — D539 Nutritional anemia, unspecified: Secondary | ICD-10-CM | POA: Diagnosis not present

## 2023-10-17 DIAGNOSIS — Z79811 Long term (current) use of aromatase inhibitors: Secondary | ICD-10-CM | POA: Diagnosis not present

## 2023-10-17 DIAGNOSIS — Z7951 Long term (current) use of inhaled steroids: Secondary | ICD-10-CM | POA: Diagnosis not present

## 2023-10-17 DIAGNOSIS — C50312 Malignant neoplasm of lower-inner quadrant of left female breast: Secondary | ICD-10-CM | POA: Diagnosis not present

## 2023-10-17 DIAGNOSIS — J4530 Mild persistent asthma, uncomplicated: Secondary | ICD-10-CM | POA: Diagnosis not present

## 2023-10-17 DIAGNOSIS — M1990 Unspecified osteoarthritis, unspecified site: Secondary | ICD-10-CM | POA: Diagnosis not present

## 2023-10-17 DIAGNOSIS — E785 Hyperlipidemia, unspecified: Secondary | ICD-10-CM | POA: Diagnosis not present

## 2023-10-17 DIAGNOSIS — H903 Sensorineural hearing loss, bilateral: Secondary | ICD-10-CM | POA: Diagnosis not present

## 2023-10-17 DIAGNOSIS — Z170 Estrogen receptor positive status [ER+]: Secondary | ICD-10-CM | POA: Diagnosis not present

## 2023-10-17 DIAGNOSIS — Z9181 History of falling: Secondary | ICD-10-CM | POA: Diagnosis not present

## 2023-10-17 DIAGNOSIS — N3281 Overactive bladder: Secondary | ICD-10-CM | POA: Diagnosis not present

## 2023-10-17 DIAGNOSIS — E559 Vitamin D deficiency, unspecified: Secondary | ICD-10-CM | POA: Diagnosis not present

## 2023-10-17 DIAGNOSIS — M5442 Lumbago with sciatica, left side: Secondary | ICD-10-CM | POA: Diagnosis not present

## 2023-10-17 DIAGNOSIS — F321 Major depressive disorder, single episode, moderate: Secondary | ICD-10-CM | POA: Diagnosis not present

## 2023-10-17 DIAGNOSIS — G8929 Other chronic pain: Secondary | ICD-10-CM | POA: Diagnosis not present

## 2023-10-17 DIAGNOSIS — G4700 Insomnia, unspecified: Secondary | ICD-10-CM | POA: Diagnosis not present

## 2023-10-17 DIAGNOSIS — Z853 Personal history of malignant neoplasm of breast: Secondary | ICD-10-CM | POA: Diagnosis not present

## 2023-10-17 DIAGNOSIS — C50912 Malignant neoplasm of unspecified site of left female breast: Secondary | ICD-10-CM | POA: Insufficient documentation

## 2023-10-17 DIAGNOSIS — M810 Age-related osteoporosis without current pathological fracture: Secondary | ICD-10-CM | POA: Diagnosis not present

## 2023-10-17 DIAGNOSIS — Z87442 Personal history of urinary calculi: Secondary | ICD-10-CM | POA: Diagnosis not present

## 2023-10-17 DIAGNOSIS — Z791 Long term (current) use of non-steroidal anti-inflammatories (NSAID): Secondary | ICD-10-CM | POA: Diagnosis not present

## 2023-10-17 DIAGNOSIS — F419 Anxiety disorder, unspecified: Secondary | ICD-10-CM | POA: Diagnosis not present

## 2023-10-17 DIAGNOSIS — F03A3 Unspecified dementia, mild, with mood disturbance: Secondary | ICD-10-CM | POA: Diagnosis not present

## 2023-10-17 DIAGNOSIS — M5441 Lumbago with sciatica, right side: Secondary | ICD-10-CM | POA: Diagnosis not present

## 2023-10-17 DIAGNOSIS — Z8673 Personal history of transient ischemic attack (TIA), and cerebral infarction without residual deficits: Secondary | ICD-10-CM | POA: Diagnosis not present

## 2023-10-17 DIAGNOSIS — N390 Urinary tract infection, site not specified: Secondary | ICD-10-CM | POA: Diagnosis not present

## 2023-10-17 DIAGNOSIS — I10 Essential (primary) hypertension: Secondary | ICD-10-CM | POA: Diagnosis not present

## 2023-10-25 DIAGNOSIS — H47233 Glaucomatous optic atrophy, bilateral: Secondary | ICD-10-CM | POA: Diagnosis not present

## 2023-10-25 DIAGNOSIS — Z9841 Cataract extraction status, right eye: Secondary | ICD-10-CM | POA: Diagnosis not present

## 2023-10-25 DIAGNOSIS — H524 Presbyopia: Secondary | ICD-10-CM | POA: Diagnosis not present

## 2023-10-25 DIAGNOSIS — H40013 Open angle with borderline findings, low risk, bilateral: Secondary | ICD-10-CM | POA: Diagnosis not present

## 2023-10-25 DIAGNOSIS — Z9842 Cataract extraction status, left eye: Secondary | ICD-10-CM | POA: Diagnosis not present

## 2023-10-25 DIAGNOSIS — L209 Atopic dermatitis, unspecified: Secondary | ICD-10-CM | POA: Diagnosis not present

## 2023-10-25 DIAGNOSIS — H5231 Anisometropia: Secondary | ICD-10-CM | POA: Diagnosis not present

## 2023-10-25 DIAGNOSIS — Z961 Presence of intraocular lens: Secondary | ICD-10-CM | POA: Diagnosis not present

## 2023-10-25 DIAGNOSIS — H43813 Vitreous degeneration, bilateral: Secondary | ICD-10-CM | POA: Diagnosis not present

## 2023-10-25 DIAGNOSIS — H52221 Regular astigmatism, right eye: Secondary | ICD-10-CM | POA: Diagnosis not present

## 2023-10-26 DIAGNOSIS — I10 Essential (primary) hypertension: Secondary | ICD-10-CM | POA: Diagnosis not present

## 2023-10-26 DIAGNOSIS — J4530 Mild persistent asthma, uncomplicated: Secondary | ICD-10-CM | POA: Diagnosis not present

## 2023-10-26 DIAGNOSIS — F03A3 Unspecified dementia, mild, with mood disturbance: Secondary | ICD-10-CM | POA: Diagnosis not present

## 2023-10-26 DIAGNOSIS — D539 Nutritional anemia, unspecified: Secondary | ICD-10-CM | POA: Diagnosis not present

## 2023-10-26 DIAGNOSIS — G4700 Insomnia, unspecified: Secondary | ICD-10-CM | POA: Diagnosis not present

## 2023-10-26 DIAGNOSIS — N390 Urinary tract infection, site not specified: Secondary | ICD-10-CM | POA: Diagnosis not present

## 2023-11-11 DIAGNOSIS — D539 Nutritional anemia, unspecified: Secondary | ICD-10-CM | POA: Diagnosis not present

## 2023-11-11 DIAGNOSIS — Z23 Encounter for immunization: Secondary | ICD-10-CM | POA: Diagnosis not present

## 2023-11-11 DIAGNOSIS — E785 Hyperlipidemia, unspecified: Secondary | ICD-10-CM | POA: Diagnosis not present

## 2023-11-11 DIAGNOSIS — G8929 Other chronic pain: Secondary | ICD-10-CM | POA: Diagnosis not present

## 2023-11-11 DIAGNOSIS — M5441 Lumbago with sciatica, right side: Secondary | ICD-10-CM | POA: Diagnosis not present

## 2023-11-11 DIAGNOSIS — E559 Vitamin D deficiency, unspecified: Secondary | ICD-10-CM | POA: Diagnosis not present

## 2023-11-11 DIAGNOSIS — M5442 Lumbago with sciatica, left side: Secondary | ICD-10-CM | POA: Diagnosis not present

## 2023-11-11 DIAGNOSIS — F419 Anxiety disorder, unspecified: Secondary | ICD-10-CM | POA: Diagnosis not present

## 2023-11-11 DIAGNOSIS — E538 Deficiency of other specified B group vitamins: Secondary | ICD-10-CM | POA: Diagnosis not present

## 2023-11-11 DIAGNOSIS — R739 Hyperglycemia, unspecified: Secondary | ICD-10-CM | POA: Diagnosis not present

## 2023-11-11 DIAGNOSIS — F321 Major depressive disorder, single episode, moderate: Secondary | ICD-10-CM | POA: Diagnosis not present

## 2023-11-11 DIAGNOSIS — I10 Essential (primary) hypertension: Secondary | ICD-10-CM | POA: Diagnosis not present

## 2023-11-11 DIAGNOSIS — M1990 Unspecified osteoarthritis, unspecified site: Secondary | ICD-10-CM | POA: Diagnosis not present

## 2023-11-15 DIAGNOSIS — M170 Bilateral primary osteoarthritis of knee: Secondary | ICD-10-CM | POA: Diagnosis not present

## 2023-11-15 DIAGNOSIS — M1711 Unilateral primary osteoarthritis, right knee: Secondary | ICD-10-CM | POA: Diagnosis not present

## 2023-11-17 DIAGNOSIS — J4530 Mild persistent asthma, uncomplicated: Secondary | ICD-10-CM | POA: Diagnosis not present

## 2023-11-17 DIAGNOSIS — J209 Acute bronchitis, unspecified: Secondary | ICD-10-CM | POA: Diagnosis not present

## 2023-11-17 DIAGNOSIS — Z6829 Body mass index (BMI) 29.0-29.9, adult: Secondary | ICD-10-CM | POA: Diagnosis not present

## 2023-12-06 DIAGNOSIS — J449 Chronic obstructive pulmonary disease, unspecified: Secondary | ICD-10-CM | POA: Diagnosis not present

## 2023-12-06 DIAGNOSIS — I10 Essential (primary) hypertension: Secondary | ICD-10-CM | POA: Diagnosis not present

## 2023-12-06 DIAGNOSIS — Z7982 Long term (current) use of aspirin: Secondary | ICD-10-CM | POA: Diagnosis not present

## 2023-12-06 DIAGNOSIS — R0789 Other chest pain: Secondary | ICD-10-CM | POA: Diagnosis not present

## 2023-12-06 DIAGNOSIS — F419 Anxiety disorder, unspecified: Secondary | ICD-10-CM | POA: Diagnosis not present

## 2023-12-06 DIAGNOSIS — Z79899 Other long term (current) drug therapy: Secondary | ICD-10-CM | POA: Diagnosis not present

## 2023-12-06 DIAGNOSIS — M810 Age-related osteoporosis without current pathological fracture: Secondary | ICD-10-CM | POA: Diagnosis not present

## 2023-12-06 DIAGNOSIS — M1990 Unspecified osteoarthritis, unspecified site: Secondary | ICD-10-CM | POA: Diagnosis not present

## 2023-12-06 DIAGNOSIS — W19XXXA Unspecified fall, initial encounter: Secondary | ICD-10-CM | POA: Diagnosis not present

## 2023-12-06 DIAGNOSIS — S20212A Contusion of left front wall of thorax, initial encounter: Secondary | ICD-10-CM | POA: Diagnosis not present

## 2023-12-06 DIAGNOSIS — S299XXA Unspecified injury of thorax, initial encounter: Secondary | ICD-10-CM | POA: Diagnosis not present

## 2023-12-06 DIAGNOSIS — R296 Repeated falls: Secondary | ICD-10-CM | POA: Diagnosis not present

## 2023-12-06 DIAGNOSIS — S0990XA Unspecified injury of head, initial encounter: Secondary | ICD-10-CM | POA: Diagnosis not present

## 2023-12-06 DIAGNOSIS — G629 Polyneuropathy, unspecified: Secondary | ICD-10-CM | POA: Diagnosis not present

## 2023-12-06 DIAGNOSIS — F32A Depression, unspecified: Secondary | ICD-10-CM | POA: Diagnosis not present

## 2023-12-13 DIAGNOSIS — S81812A Laceration without foreign body, left lower leg, initial encounter: Secondary | ICD-10-CM | POA: Diagnosis not present

## 2023-12-21 ENCOUNTER — Other Ambulatory Visit: Payer: Self-pay | Admitting: Physician Assistant

## 2023-12-27 DIAGNOSIS — S81812A Laceration without foreign body, left lower leg, initial encounter: Secondary | ICD-10-CM | POA: Diagnosis not present

## 2024-02-20 ENCOUNTER — Ambulatory Visit: Admitting: Physician Assistant

## 2024-04-04 ENCOUNTER — Ambulatory Visit: Admitting: Physician Assistant

## 2024-04-16 ENCOUNTER — Ambulatory Visit: Admitting: Oncology
